# Patient Record
Sex: Female | Born: 1935
Health system: Southern US, Community
[De-identification: ages and names within clinical notes are randomized; demographics above are authoritative.]

## PROBLEM LIST (undated history)

## (undated) DIAGNOSIS — E7800 Pure hypercholesterolemia, unspecified: Secondary | ICD-10-CM

## (undated) DIAGNOSIS — C55 Malignant neoplasm of uterus, part unspecified: Secondary | ICD-10-CM

## (undated) DIAGNOSIS — F0280 Dementia in other diseases classified elsewhere without behavioral disturbance: Secondary | ICD-10-CM

## (undated) DIAGNOSIS — G309 Alzheimer's disease, unspecified: Secondary | ICD-10-CM

## (undated) HISTORY — PX: OTHER SURGICAL HISTORY: SHX169

## (undated) HISTORY — DX: Dementia in other diseases classified elsewhere, unspecified severity, without behavioral disturbance, psychotic disturbance, mood disturbance, and anxiety: F02.80

## (undated) HISTORY — DX: Pure hypercholesterolemia, unspecified: E78.00

## (undated) HISTORY — DX: Malignant neoplasm of uterus, part unspecified: C55

## (undated) HISTORY — DX: Alzheimer's disease, unspecified: G30.9

---

## 1995-12-30 HISTORY — PX: ABDOMINAL HYSTERECTOMY: SHX81

## 2012-01-23 LAB — HM COLONOSCOPY

## 2014-03-01 LAB — HM DEXA SCAN

## 2015-01-25 DIAGNOSIS — R413 Other amnesia: Secondary | ICD-10-CM | POA: Diagnosis not present

## 2015-01-29 DIAGNOSIS — H25813 Combined forms of age-related cataract, bilateral: Secondary | ICD-10-CM | POA: Diagnosis not present

## 2015-01-29 DIAGNOSIS — H02839 Dermatochalasis of unspecified eye, unspecified eyelid: Secondary | ICD-10-CM | POA: Diagnosis not present

## 2015-02-12 DIAGNOSIS — Z6821 Body mass index (BMI) 21.0-21.9, adult: Secondary | ICD-10-CM | POA: Diagnosis not present

## 2015-02-12 DIAGNOSIS — E785 Hyperlipidemia, unspecified: Secondary | ICD-10-CM | POA: Diagnosis not present

## 2015-02-12 DIAGNOSIS — R634 Abnormal weight loss: Secondary | ICD-10-CM | POA: Diagnosis not present

## 2015-02-12 DIAGNOSIS — Z0000 Encounter for general adult medical examination without abnormal findings: Secondary | ICD-10-CM | POA: Diagnosis not present

## 2015-02-12 DIAGNOSIS — R413 Other amnesia: Secondary | ICD-10-CM | POA: Diagnosis not present

## 2015-03-05 DIAGNOSIS — Z1231 Encounter for screening mammogram for malignant neoplasm of breast: Secondary | ICD-10-CM | POA: Diagnosis not present

## 2015-03-05 DIAGNOSIS — Z7251 High risk heterosexual behavior: Secondary | ICD-10-CM | POA: Diagnosis not present

## 2015-03-12 DIAGNOSIS — H25812 Combined forms of age-related cataract, left eye: Secondary | ICD-10-CM | POA: Diagnosis not present

## 2015-03-20 DIAGNOSIS — H25812 Combined forms of age-related cataract, left eye: Secondary | ICD-10-CM | POA: Diagnosis not present

## 2015-03-20 DIAGNOSIS — Z79899 Other long term (current) drug therapy: Secondary | ICD-10-CM | POA: Diagnosis not present

## 2015-04-02 DIAGNOSIS — H25811 Combined forms of age-related cataract, right eye: Secondary | ICD-10-CM | POA: Diagnosis not present

## 2015-04-11 DIAGNOSIS — H25811 Combined forms of age-related cataract, right eye: Secondary | ICD-10-CM | POA: Diagnosis not present

## 2015-04-18 DIAGNOSIS — R413 Other amnesia: Secondary | ICD-10-CM | POA: Diagnosis not present

## 2015-05-24 DIAGNOSIS — M79672 Pain in left foot: Secondary | ICD-10-CM | POA: Diagnosis not present

## 2015-05-24 DIAGNOSIS — M79671 Pain in right foot: Secondary | ICD-10-CM | POA: Diagnosis not present

## 2015-05-24 DIAGNOSIS — B070 Plantar wart: Secondary | ICD-10-CM | POA: Diagnosis not present

## 2015-06-11 DIAGNOSIS — M79671 Pain in right foot: Secondary | ICD-10-CM | POA: Diagnosis not present

## 2015-06-11 DIAGNOSIS — B070 Plantar wart: Secondary | ICD-10-CM | POA: Diagnosis not present

## 2015-06-18 DIAGNOSIS — R413 Other amnesia: Secondary | ICD-10-CM | POA: Diagnosis not present

## 2015-06-25 DIAGNOSIS — M79671 Pain in right foot: Secondary | ICD-10-CM | POA: Diagnosis not present

## 2015-06-25 DIAGNOSIS — B070 Plantar wart: Secondary | ICD-10-CM | POA: Diagnosis not present

## 2015-07-18 DIAGNOSIS — M899 Disorder of bone, unspecified: Secondary | ICD-10-CM | POA: Diagnosis not present

## 2015-07-18 DIAGNOSIS — M949 Disorder of cartilage, unspecified: Secondary | ICD-10-CM | POA: Diagnosis not present

## 2015-07-18 DIAGNOSIS — E785 Hyperlipidemia, unspecified: Secondary | ICD-10-CM | POA: Diagnosis not present

## 2015-07-18 DIAGNOSIS — R413 Other amnesia: Secondary | ICD-10-CM | POA: Diagnosis not present

## 2015-07-18 DIAGNOSIS — Z6821 Body mass index (BMI) 21.0-21.9, adult: Secondary | ICD-10-CM | POA: Diagnosis not present

## 2015-08-08 DIAGNOSIS — Z961 Presence of intraocular lens: Secondary | ICD-10-CM | POA: Diagnosis not present

## 2015-08-08 DIAGNOSIS — H43393 Other vitreous opacities, bilateral: Secondary | ICD-10-CM | POA: Diagnosis not present

## 2015-09-13 DIAGNOSIS — L821 Other seborrheic keratosis: Secondary | ICD-10-CM | POA: Diagnosis not present

## 2015-09-13 DIAGNOSIS — Z1389 Encounter for screening for other disorder: Secondary | ICD-10-CM | POA: Diagnosis not present

## 2015-09-13 DIAGNOSIS — L814 Other melanin hyperpigmentation: Secondary | ICD-10-CM | POA: Diagnosis not present

## 2015-10-03 DIAGNOSIS — R413 Other amnesia: Secondary | ICD-10-CM | POA: Diagnosis not present

## 2015-10-31 DIAGNOSIS — Z23 Encounter for immunization: Secondary | ICD-10-CM | POA: Diagnosis not present

## 2015-10-31 DIAGNOSIS — D485 Neoplasm of uncertain behavior of skin: Secondary | ICD-10-CM | POA: Diagnosis not present

## 2015-11-08 DIAGNOSIS — D485 Neoplasm of uncertain behavior of skin: Secondary | ICD-10-CM | POA: Diagnosis not present

## 2015-12-11 DIAGNOSIS — L708 Other acne: Secondary | ICD-10-CM | POA: Diagnosis not present

## 2016-01-18 DIAGNOSIS — R634 Abnormal weight loss: Secondary | ICD-10-CM | POA: Diagnosis not present

## 2016-01-18 DIAGNOSIS — R011 Cardiac murmur, unspecified: Secondary | ICD-10-CM | POA: Diagnosis not present

## 2016-01-18 DIAGNOSIS — E785 Hyperlipidemia, unspecified: Secondary | ICD-10-CM | POA: Diagnosis not present

## 2016-01-18 DIAGNOSIS — Z6822 Body mass index (BMI) 22.0-22.9, adult: Secondary | ICD-10-CM | POA: Diagnosis not present

## 2016-01-18 DIAGNOSIS — C549 Malignant neoplasm of corpus uteri, unspecified: Secondary | ICD-10-CM | POA: Diagnosis not present

## 2016-01-18 LAB — CBC AND DIFFERENTIAL
HCT: 36 % (ref 36–46)
Hemoglobin: 12 g/dL (ref 12.0–16.0)
Platelets: 208 10*3/uL (ref 150–399)
WBC: 7.1 10*3/mL

## 2016-01-18 LAB — LIPID PANEL
CHOLESTEROL: 123 mg/dL (ref 0–200)
HDL: 55 mg/dL (ref 35–70)
LDL Cholesterol: 52 mg/dL
TRIGLYCERIDES: 78 mg/dL (ref 40–160)

## 2016-01-18 LAB — BASIC METABOLIC PANEL
BUN: 16 mg/dL (ref 4–21)
CREATININE: 0.8 mg/dL (ref 0.5–1.1)
Glucose: 80 mg/dL
POTASSIUM: 5.2 mmol/L (ref 3.4–5.3)
Sodium: 143 mmol/L (ref 137–147)

## 2016-01-18 LAB — TSH: TSH: 2.13 u[IU]/mL (ref 0.41–5.90)

## 2016-02-06 DIAGNOSIS — R011 Cardiac murmur, unspecified: Secondary | ICD-10-CM | POA: Diagnosis not present

## 2016-02-14 DIAGNOSIS — H43393 Other vitreous opacities, bilateral: Secondary | ICD-10-CM | POA: Diagnosis not present

## 2016-02-14 DIAGNOSIS — Z961 Presence of intraocular lens: Secondary | ICD-10-CM | POA: Diagnosis not present

## 2016-02-14 DIAGNOSIS — H26491 Other secondary cataract, right eye: Secondary | ICD-10-CM | POA: Diagnosis not present

## 2016-04-02 DIAGNOSIS — G309 Alzheimer's disease, unspecified: Secondary | ICD-10-CM | POA: Diagnosis not present

## 2016-04-02 DIAGNOSIS — R413 Other amnesia: Secondary | ICD-10-CM | POA: Diagnosis not present

## 2016-04-07 DIAGNOSIS — Z124 Encounter for screening for malignant neoplasm of cervix: Secondary | ICD-10-CM | POA: Diagnosis not present

## 2016-04-07 DIAGNOSIS — Z6822 Body mass index (BMI) 22.0-22.9, adult: Secondary | ICD-10-CM | POA: Diagnosis not present

## 2016-04-07 DIAGNOSIS — Z1231 Encounter for screening mammogram for malignant neoplasm of breast: Secondary | ICD-10-CM | POA: Diagnosis not present

## 2016-04-07 LAB — HM MAMMOGRAPHY: HM MAMMO: NORMAL (ref 0–4)

## 2016-05-13 DIAGNOSIS — Z6821 Body mass index (BMI) 21.0-21.9, adult: Secondary | ICD-10-CM | POA: Diagnosis not present

## 2016-05-13 DIAGNOSIS — R413 Other amnesia: Secondary | ICD-10-CM | POA: Diagnosis not present

## 2016-05-13 DIAGNOSIS — E785 Hyperlipidemia, unspecified: Secondary | ICD-10-CM | POA: Diagnosis not present

## 2016-07-04 ENCOUNTER — Telehealth: Payer: Self-pay | Admitting: Internal Medicine

## 2016-08-13 ENCOUNTER — Non-Acute Institutional Stay: Payer: Medicare Other | Admitting: Internal Medicine

## 2016-08-13 ENCOUNTER — Encounter: Payer: Self-pay | Admitting: Internal Medicine

## 2016-08-13 VITALS — BP 120/70 | HR 75 | Temp 98.7°F | Ht 60.5 in | Wt 114.0 lb

## 2016-08-13 DIAGNOSIS — M150 Primary generalized (osteo)arthritis: Secondary | ICD-10-CM | POA: Diagnosis not present

## 2016-08-13 DIAGNOSIS — M159 Polyosteoarthritis, unspecified: Secondary | ICD-10-CM

## 2016-08-13 DIAGNOSIS — F0280 Dementia in other diseases classified elsewhere without behavioral disturbance: Secondary | ICD-10-CM

## 2016-08-13 DIAGNOSIS — E785 Hyperlipidemia, unspecified: Secondary | ICD-10-CM

## 2016-08-13 DIAGNOSIS — N3281 Overactive bladder: Secondary | ICD-10-CM

## 2016-08-13 DIAGNOSIS — M8580 Other specified disorders of bone density and structure, unspecified site: Secondary | ICD-10-CM

## 2016-08-13 DIAGNOSIS — G301 Alzheimer's disease with late onset: Secondary | ICD-10-CM

## 2016-08-13 DIAGNOSIS — Z8542 Personal history of malignant neoplasm of other parts of uterus: Secondary | ICD-10-CM

## 2016-08-13 DIAGNOSIS — M8949 Other hypertrophic osteoarthropathy, multiple sites: Secondary | ICD-10-CM

## 2016-08-13 NOTE — Progress Notes (Signed)
Provider:  Rexene Edison. Mariea Clonts, D.O., C.M.D. Location:   [facility]   Place of Service:  Clinic (12)  Previous PCP: Hollace Kinnier, DO Patient Care Team: Gayland Curry, DO as PCP - General (Geriatric Medicine) Addison Lank, MD as Consulting Physician (Dermatology)  Extended Emergency Contact Information Primary Emergency Contact: Jaeger,[name] Address: [address] [place], [place] [address] [name] of [place] Phone: [phone] Relation: Son  Code Status: full code Goals of Care: Advanced Directive information No flowsheet data found.  Chief Complaint  Patient presents with  . Establish Care    new patient    HPI: Patient is a 82 y.o. female seen today to establish with [facility].  She has a h/o Alzheimer's disease, uterine cancer and hyperlipidemia.  Records have been requested and received from [facility] hospital/IM.    AD:  On Exelon patch.  Is happy here.  She is unsure how long she has been here.  2/16, patch was started soon after diagnosis.    Says we are going into fall, says it's 2018.  She thinks it is already September.  Now living in [place].  Says she could find it, but does not know address. Spirits are good.  No hallucinations.  Sleeps well at night.  No falls.    Hears very well.  Sight pretty good, too.    Uterine ca:  S/p hysterectomy in 1997 at [facility].    Hyperlipidemia:  On simvastatin.  Overactive bladder:  On myrbetriq 50mg  daily which has helped.  She does still go more than an average person.   Bone density 03/01/14--on vitamin D.  I don't have a copy.  She has not been on meds for osteoporosis.  She does walk regularly with her husband.  Past Medical History:  Diagnosis Date  . Alzheimer's disease   . High cholesterol   . Uterine cancer [facility])    Past Surgical History:  Procedure Laterality Date  . [facility]  . cataract surgery Bilateral     Social History   Social  History  . Marital status: Married    Spouse name: N/A  . Number of children: 2  . Years of education: N/A   Occupational History  . Education officer, museum    Social History Main Topics  . Smoking status: Never Smoker  . Smokeless tobacco: Former Systems developer  . Alcohol use 0.6 - 1.2 oz/week    1 - 2 Standard drinks or equivalent per week  . Drug use: No  . Sexual activity: Not Asked   Other Topics Concern  . None   Social History Narrative   Diet? No      Do you drink/eat things with caffeine?  No      Marital status?     Married                               What year were you married?  1960      Do you live in a house, apartment, assisted living, condo, trailer, etc.?  [place]      Is it one or more stories? One      How many persons live in your home? 2      Do you have any pets in your home? (please list) No      Current or past profession:  Retired Education officer, museum      Do you exercise?               yes                       Type & how often?  Walk daily      Do you have a living will? no      Do you have a DNR form?     no                             If not, do you want to discuss one? yes      Do you have signed POA/HPOA for forms? yes          reports that she has never smoked. She has quit using smokeless tobacco. She reports that she drinks about 0.6 - 1.2 oz of alcohol per week . She reports that she does not use drugs.  Functional Status Survey: Is the patient deaf or have difficulty hearing?: No Does the patient have difficulty seeing, even when wearing glasses/contacts?: No Does the patient have difficulty concentrating, remembering, or making decisions?: Yes Does the patient have difficulty walking or climbing stairs?: No Does the patient have difficulty dressing or bathing?: No Does the patient have difficulty doing errands alone such as visiting a doctor's office or shopping?: Yes  Family History  Problem Relation Age of Onset  .  Suicidality Brother   . Heart failure Brother   . Leukemia Sister     indicated that her mother is deceased. She indicated that her father is deceased. She indicated that two of her three sisters are alive. She indicated that both of her brothers are deceased. She indicated that her daughter is alive. She indicated that her son is alive.  see encounter for illnesses--aren't showing up for some reason  Health Maintenance  Topic Date Due  . TETANUS/TDAP  12/05/1954  . ZOSTAVAX  12/06/1995  . PNA vac Low Risk Adult (2 of 2 - PCV13) 02/03/2013  . INFLUENZA VACCINE  07/29/2016  . COLONOSCOPY  01/22/2017  . MAMMOGRAM  04/07/2017  . DEXA SCAN  Completed    Allergies  Allergen Reactions  . Penicillins Hives  . Sulfa Antibiotics Hives      Medication List       Accurate as of 08/13/16 11:59 PM. Always use your most recent med list.          GLUCOSAMINE CHONDR 1500 COMPLX PO Take 1 tablet by mouth 2 (two) times daily.   multivitamin with minerals tablet Take 1 tablet by mouth daily.   MYRBETRIQ 50 MG Tb24 tablet Generic drug:  mirabegron ER Take 50 mg by mouth daily.   rivastigmine 9.5 mg/24hr Commonly known as:  EXELON Place 9.5 mg onto the skin daily.   simvastatin 20 MG tablet Commonly known as:  ZOCOR Take 20 mg by mouth daily.   Vitamin D3 2000 units Tabs Take 1 tablet by mouth daily.       Review of Systems  Constitutional: Negative for chills, fever and malaise/fatigue.  HENT: Negative for congestion and hearing loss.   Eyes: Negative for blurred vision.       Prior cataract surgery  Respiratory: Negative for cough and shortness of breath.   Cardiovascular: Negative for chest pain, palpitations and leg swelling.  Gastrointestinal: Negative for abdominal pain, blood in stool, constipation, diarrhea and  melena.  Genitourinary: Positive for frequency. Negative for dysuria, flank pain, hematuria and urgency.  Musculoskeletal: Positive for joint pain. Negative  for falls and myalgias.  Skin: Negative for itching and rash.  Neurological: Negative for dizziness, loss of consciousness, weakness and headaches.  Endo/Heme/Allergies: Does not bruise/bleed easily.  Psychiatric/Behavioral: Positive for memory loss. Negative for depression, hallucinations, substance abuse and suicidal ideas. The patient is not nervous/anxious and does not have insomnia.     Vitals:   08/13/16 1426  BP: 120/70  Pulse: 75  Temp: 98.7 F (37.1 C)  TempSrc: Oral  SpO2: 95%  Weight: 114 lb (51.7 kg)  Height: 5' 0.5" (1.537 m)   Body mass index is 21.9 kg/m. Physical Exam  Constitutional: She appears well-developed and well-nourished. No distress.  Thin white female   HENT:  Head: Normocephalic and atraumatic.  Eyes: EOM are normal. Pupils are equal, round, and reactive to light.  Neck: Neck supple.  Cardiovascular: Normal rate, regular rhythm, normal heart sounds and intact distal pulses.   Pulmonary/Chest: Effort normal and breath sounds normal. No respiratory distress.  Abdominal: Soft. Bowel sounds are normal.  Musculoskeletal: Normal range of motion. She exhibits no tenderness.  Neurological: She is alert. She has normal reflexes. No cranial nerve deficit. She exhibits normal muscle tone.  Oriented to person and place, not precise time  Skin: Skin is warm and dry.  Psychiatric: She has a normal mood and affect.  Short term memory loss--did repeat herself some during the course of the visit    Labs reviewed: Basic Metabolic Panel:  Recent Labs  01/18/16  NA 143  K 5.2  BUN 16  CREATININE 0.8   Liver Function Tests: No results for input(s): AST, ALT, ALKPHOS, BILITOT, PROT, ALBUMIN in the last 8760 hours. No results for input(s): LIPASE, AMYLASE in the last 8760 hours. No results for input(s): AMMONIA in the last 8760 hours. CBC:  Recent Labs  01/18/16  WBC 7.1  HGB 12.0  HCT 36  PLT 208   Cardiac Enzymes: No results for input(s): CKTOTAL,  CKMB, CKMBINDEX, TROPONINI in the last 8760 hours. BNP: Invalid input(s): POCBNP No results found for: HGBA1C Lab Results  Component Value Date   TSH 2.13 01/18/2016   No results found for: VITAMINB12 No results found for: FOLATE No results found for: IRON, TIBC, FERRITIN  Imaging and Procedures noted on new patient packet: [facility], [place], [place] 2013  [facility], [place], [place] CT, MRI brain re: dementia--chronic small vessel ischemic changes  Assessment/Plan 1. Late onset Alzheimer's disease without behavioral disturbance -will cont on exelon patch 9.5mg /24hr -will need to check with her husband if there is a reason she has not been put on the max dose of 13.3mg /24hr since the dose is not severity dependent  -will need to do MMSE next time -she needs guidance when doing her adls in the morning and can no longer fix herself up as well as she once did (difficulty doing her hair and makeup vs. In the past)--her husband reports that he is still able to help her mostly on his own but also with the support of local family (why they moved here to begin with)  2. History of uterine cancer -s/p hysterectomy  -no difficulties since  3. Hyperlipidemia -continue her zocor which has been effective and she has not had problems from it -has no difficulties with med mgt  4. OAB (overactive bladder) -some improvement in the incontinence and degree  of frequency with 50mg  myrbetriq so will continue this, does use a pad also  5. Osteopenia -cont on VitaminD3 and weightbearing exercise -is overdue for a repeat bone density so wiill plan to order at the next visit--asked CMA to request the bone density report from prior pcp b/c we only have the date it was done and her husband was not as actively involved at that point so he's not sure about details of result  6. Primary osteoarthritis involving multiple joints -continues on glucosamine chondroitin  supplement and advised to use tylenol if she has increased pain  Labs/tests ordered:  Cbc, bmp, tsh, lipid before next appt Next appt:  12/10/2016  Cregg Jutte L. Kumiko Fishman, D.O. [facility] [address], [place] [address] Cell Phone (Mon-Fri 8am-5pm):  [phone] On Call:  [phone] & follow prompts after 5pm & weekends Office Phone:  [phone] Office Fax:  [phone]

## 2016-08-24 ENCOUNTER — Encounter: Payer: Self-pay | Admitting: Internal Medicine

## 2016-08-24 DIAGNOSIS — M8580 Other specified disorders of bone density and structure, unspecified site: Secondary | ICD-10-CM | POA: Insufficient documentation

## 2016-08-24 DIAGNOSIS — E785 Hyperlipidemia, unspecified: Secondary | ICD-10-CM | POA: Insufficient documentation

## 2016-08-24 DIAGNOSIS — N3281 Overactive bladder: Secondary | ICD-10-CM | POA: Insufficient documentation

## 2016-08-24 DIAGNOSIS — F0280 Dementia in other diseases classified elsewhere without behavioral disturbance: Secondary | ICD-10-CM | POA: Insufficient documentation

## 2016-08-24 DIAGNOSIS — G309 Alzheimer's disease, unspecified: Secondary | ICD-10-CM

## 2016-08-24 DIAGNOSIS — Z8542 Personal history of malignant neoplasm of other parts of uterus: Secondary | ICD-10-CM | POA: Insufficient documentation

## 2016-08-24 DIAGNOSIS — M8949 Other hypertrophic osteoarthropathy, multiple sites: Secondary | ICD-10-CM | POA: Insufficient documentation

## 2016-08-24 DIAGNOSIS — M150 Primary generalized (osteo)arthritis: Secondary | ICD-10-CM

## 2016-08-24 DIAGNOSIS — M159 Polyosteoarthritis, unspecified: Secondary | ICD-10-CM | POA: Insufficient documentation

## 2016-09-17 DIAGNOSIS — L218 Other seborrheic dermatitis: Secondary | ICD-10-CM | POA: Diagnosis not present

## 2016-10-23 DIAGNOSIS — Z23 Encounter for immunization: Secondary | ICD-10-CM | POA: Diagnosis not present

## 2016-12-10 ENCOUNTER — Encounter: Payer: Self-pay | Admitting: Internal Medicine

## 2017-01-07 ENCOUNTER — Non-Acute Institutional Stay: Payer: Medicare Other | Admitting: Internal Medicine

## 2017-01-07 ENCOUNTER — Encounter: Payer: Self-pay | Admitting: Internal Medicine

## 2017-01-07 VITALS — BP 120/70 | HR 72 | Temp 98.7°F | Ht 61.0 in | Wt 116.0 lb

## 2017-01-07 DIAGNOSIS — R413 Other amnesia: Secondary | ICD-10-CM

## 2017-01-07 DIAGNOSIS — M150 Primary generalized (osteo)arthritis: Secondary | ICD-10-CM | POA: Diagnosis not present

## 2017-01-07 DIAGNOSIS — E7800 Pure hypercholesterolemia, unspecified: Secondary | ICD-10-CM

## 2017-01-07 DIAGNOSIS — N3281 Overactive bladder: Secondary | ICD-10-CM

## 2017-01-07 DIAGNOSIS — G301 Alzheimer's disease with late onset: Secondary | ICD-10-CM

## 2017-01-07 DIAGNOSIS — M159 Polyosteoarthritis, unspecified: Secondary | ICD-10-CM

## 2017-01-07 DIAGNOSIS — M8589 Other specified disorders of bone density and structure, multiple sites: Secondary | ICD-10-CM | POA: Diagnosis not present

## 2017-01-07 DIAGNOSIS — E2839 Other primary ovarian failure: Secondary | ICD-10-CM

## 2017-01-07 DIAGNOSIS — M8949 Other hypertrophic osteoarthropathy, multiple sites: Secondary | ICD-10-CM

## 2017-01-07 DIAGNOSIS — F0280 Dementia in other diseases classified elsewhere without behavioral disturbance: Secondary | ICD-10-CM

## 2017-01-07 NOTE — Progress Notes (Signed)
Location:  Occupational psychologist of Service:  Clinic (12)  Provider: Oneisha Ammons L. Mariea Clonts, D.O., C.M.D.  Code Status: DNR Goals of Care:  Advanced Directives 01/07/2017  Does Patient Have a Medical Advance Directive? Yes  Type of Paramedic of [place];Living will  Copy of [place] in Chart? Yes    Chief Complaint  Patient presents with  . Medical Management of Chronic Issues    19mth follow-up  . MMSE    19/30 failed clock   HPI: Patient is a 83 y.o. female seen today for medical management of chronic diseases.    AD:  She has had some spells in the last month where she didn't know him.  It happened 3 times at night.  She was distressed that some mysterious man was in the house.  She got out of bed, put clothes on top of her pjs and a raincoat--on New Year's Eve.  She was sitting up on the sofa and he thought she was sleeping.  He went back to bed.  Music played which he first thought it was his cell phone.  Suddenly, he realized it was the front door.  Went to a neighbor's house and said there was a stranger in her house.  Security and a neighbor brought her back. When she saw him, she didn't know any of this transpired.  They are gong to install a louder door alarm and got her a necklace pendant that will keep track of her.  She had been seeing a neurologist before moving here.  She continues to get herself bathed and dressed.  Sometimes she remembers things Iona Beard forgets.    She usually sleeps well at night. Denies pain.   Some OAB on record and takes myrbetriq.  Her husband fills the pillbox and administers her meds.   Lipids looked good one year ago on zocor.   No falls.  Past Medical History:  Diagnosis Date  . Alzheimer's disease   . High cholesterol   . Uterine cancer [facility])     Past Surgical History:  Procedure Laterality Date  . [facility]  . cataract surgery Bilateral      Allergies  Allergen Reactions  . Penicillins Hives  . Sulfa Antibiotics Hives    Allergies as of 01/07/2017      Reactions   Penicillins Hives   Sulfa Antibiotics Hives      Medication List       Accurate as of 01/07/17  9:36 AM. Always use your most recent med list.          GLUCOSAMINE CHONDR 1500 COMPLX PO Take 1 tablet by mouth 2 (two) times daily.   multivitamin with minerals tablet Take 1 tablet by mouth daily.   MYRBETRIQ 50 MG Tb24 tablet Generic drug:  mirabegron ER Take 50 mg by mouth daily.   rivastigmine 9.5 mg/24hr Commonly known as:  EXELON Place 9.5 mg onto the skin daily.   simvastatin 20 MG tablet Commonly known as:  ZOCOR Take 20 mg by mouth daily.   Vitamin D3 2000 units Tabs Take 1 tablet by mouth daily.      Review of Systems:  Review of Systems  Constitutional: Negative for chills, fever and malaise/fatigue.  HENT: Negative for congestion and hearing loss.   Eyes: Negative for blurred vision.  Respiratory: Negative for shortness of breath.   Cardiovascular: Negative for chest pain, palpitations and leg swelling.  Gastrointestinal: Negative for abdominal pain, blood in stool, constipation, diarrhea, melena, nausea and vomiting.  Genitourinary: Positive for frequency. Negative for dysuria and urgency.       Improved with myrbetriq  Musculoskeletal: Negative for back pain, falls, joint pain, myalgias and neck pain.  Skin: Negative for itching and rash.  Neurological: Negative for dizziness, loss of consciousness, weakness and headaches.  Endo/Heme/Allergies: Does not bruise/bleed easily.  Psychiatric/Behavioral: Positive for memory loss. Negative for depression and hallucinations. The patient is not nervous/anxious and does not have insomnia.     Health Maintenance  Topic Date Due  . TETANUS/TDAP  12/05/1954  . ZOSTAVAX  12/06/1995  . PNA vac Low Risk Adult (2 of 2 - PCV13) 02/03/2013  . COLONOSCOPY  01/22/2017  . MAMMOGRAM   04/07/2017  . INFLUENZA VACCINE  Completed  . DEXA SCAN  Completed    Physical Exam: Vitals:   01/07/17 0840  BP: 120/70  Pulse: 72  Temp: 98.7 F (37.1 C)  TempSrc: Oral  SpO2: 96%  Weight: 116 lb (52.6 kg)  Height: 5\' 1"  (1.549 m)   Body mass index is 21.92 kg/m. Physical Exam  Constitutional: She is oriented to person, place, and time. She appears well-developed and well-nourished. No distress.  Cardiovascular: Normal rate, regular rhythm, normal heart sounds and intact distal pulses.   Pulmonary/Chest: Effort normal and breath sounds normal. No respiratory distress.  Abdominal: Soft. Bowel sounds are normal.  Musculoskeletal: Normal range of motion. She exhibits no tenderness or deformity.  Neurological: She is alert and oriented to person, place, and time.  Skin: Skin is warm and dry.  Psychiatric: She has a normal mood and affect.   Labs reviewed: Basic Metabolic Panel:  Recent Labs  01/18/16  NA 143  K 5.2  BUN 16  CREATININE 0.8  TSH 2.13  CBC:  Recent Labs  01/18/16  WBC 7.1  HGB 12.0  HCT 36  PLT 208   Lipid Panel:  Recent Labs  01/18/16  CHOL 123  HDL 55  LDLCALC 52  TRIG 78   Assessment/Plan 1. Late onset Alzheimer's disease without behavioral disturbance -is progressing--scored 19/30 on mmse today -agree with plans for louder alarm on doors, use of life alert necklace; given coach playbook for AD caregivers to her husband and pocket reference to help him cope with situations where she does not recognize him. -failed clock - will start on namenda XR titration pack plus 28mg  month supply - request neurology referral so will do this  2. Estrogen deficiency - due for f/u bone density test--osteopenia on record, cont vitamin D supplement - DG Bone Density; Future  3. Primary osteoarthritis involving multiple joints -continues on glucosamine, has no complaints about her joints  4. Osteopenia of multiple sites Recheck bone density, cont  vitamin d and weightbearing exercise  5. OAB (overactive bladder) -doing better on myrbetriq, cont same  6. Pure hypercholesterolemia -was well controlled one year ago, recheck flp next lab  Labs/tests ordered:  Cbc, cmp, flp, tsh, b12/folate Next appt:  2 mos with labs before   [place]. Maha Fischel, D.O. [facility] [address], [place] [address] Cell Phone (Mon-Fri 8am-5pm):  [phone] On Call:  [phone] & follow prompts after 5pm & weekends Office Phone:  [phone] Office Fax:  [phone]

## 2017-01-08 MED ORDER — MEMANTINE HCL ER 7 & 14 & 21 &28 MG PO CP24: ORAL_CAPSULE | ORAL | 0 refills | Status: DC

## 2017-01-08 MED ORDER — MEMANTINE HCL ER 28 MG PO CP24: 28.0000 mg | ORAL_CAPSULE | Freq: Every day | ORAL | 3 refills | Status: DC

## 2017-01-08 NOTE — Addendum Note (Signed)
Addended by: Despina Hidden on: 01/08/2017 10:23 AM   Modules accepted: Orders

## 2017-01-09 ENCOUNTER — Other Ambulatory Visit: Payer: Self-pay | Admitting: Internal Medicine

## 2017-01-12 ENCOUNTER — Other Ambulatory Visit: Payer: Self-pay | Admitting: *Deleted

## 2017-01-12 MED ORDER — MEMANTINE HCL ER 7 & 14 & 21 &28 MG PO CP24: ORAL_CAPSULE | ORAL | 0 refills | Status: DC

## 2017-01-14 ENCOUNTER — Telehealth: Payer: Self-pay | Admitting: *Deleted

## 2017-01-14 NOTE — Telephone Encounter (Signed)
Received fax from [facility] [phone]. Case ID#: [id number]. Form given to Dr. Mariea Clonts to review, fill out and sign. To be faxed back to [phone]

## 2017-01-15 ENCOUNTER — Other Ambulatory Visit: Payer: Self-pay

## 2017-01-20 NOTE — Telephone Encounter (Signed)
Fax received from [facility] and Namenda XR has been APPROVED through 12/28/2098.

## 2017-01-22 ENCOUNTER — Ambulatory Visit
Admission: RE | Admit: 2017-01-22 | Discharge: 2017-01-22 | Disposition: A | Discharge status: Home / Self Care | Payer: Medicare Other | Source: Ambulatory Visit | Attending: Internal Medicine | Admitting: Internal Medicine

## 2017-01-22 DIAGNOSIS — Z780 Asymptomatic menopausal state: Secondary | ICD-10-CM | POA: Diagnosis not present

## 2017-01-22 DIAGNOSIS — M8589 Other specified disorders of bone density and structure, multiple sites: Secondary | ICD-10-CM | POA: Diagnosis not present

## 2017-01-22 DIAGNOSIS — E2839 Other primary ovarian failure: Secondary | ICD-10-CM

## 2017-01-23 ENCOUNTER — Encounter: Payer: Self-pay | Admitting: *Deleted

## 2017-02-05 DIAGNOSIS — Z0289 Encounter for other administrative examinations: Secondary | ICD-10-CM

## 2017-02-23 ENCOUNTER — Ambulatory Visit (INDEPENDENT_AMBULATORY_CARE_PROVIDER_SITE_OTHER): Payer: Medicare Other | Admitting: Neurology

## 2017-02-23 ENCOUNTER — Encounter: Payer: Self-pay | Admitting: Neurology

## 2017-02-23 VITALS — BP 118/68 | HR 74 | Temp 97.9°F | Resp 16 | Ht 61.0 in | Wt 114.7 lb

## 2017-02-23 DIAGNOSIS — F03B0 Unspecified dementia, moderate, without behavioral disturbance, psychotic disturbance, mood disturbance, and anxiety: Secondary | ICD-10-CM

## 2017-02-23 DIAGNOSIS — F0390 Unspecified dementia without behavioral disturbance: Secondary | ICD-10-CM

## 2017-02-23 NOTE — Progress Notes (Signed)
NEUROLOGY CONSULTATION NOTE  JOHNA OVERMILLER MRN: [id number] DOB: 06/21/1933  Referring provider: Dr. Hollace Kinnier Primary care provider: Dr. Hollace Kinnier  Reason for consult:  dementia  Dear Dr Mariea Clonts:  Thank you for your kind referral of CYAN BARBOSA for consultation of the above symptoms. Although her history is well known to you, please allow me to reiterate it for the purpose of our medical record. The patient was accompanied to the clinic by her husband who also provides collateral information. Records and images were personally reviewed where available.  HISTORY OF PRESENT ILLNESS: This is a very pleasant 83 year old right-handed woman with a history of hyperlipidemia, uterine cancer, and Alzheimer's dementia, presenting to establish care. She had previously been living in [place], [place] and moved to [place] last August 2017 to be closer to her son. She and her husband live at a continuing care retirement home ([facility]). She thinks her memory is "pretty good." She then states that she lives in [place] and right now she is visiting with her husband who lives in [place]. She states "right now I am staying at your house." Her family started to notice memory changes in 2015, she was repeating herself. She was driving erratically and would miss a stop sign. She saw neurologist Dr. Eulis Manly who did several tests and was diagnosed with Alzheimer's disease. She has been on the Exelon patch since the Spring of 2016 with no side effects. She does not drive anymore. Her husband is in charge of bills. She states she is pretty good with remembering her medications, he husband shakes his head. She does not cook, they usually eat at the dining room in the facility. One time she brought back iced tea and he told her to put it in the fridge. She walked to the bathroom then the bedroom looking for the fridge, then came back with the cup in her hand not knowing what to do with it. She does  not recognize her husband 1-2 times a week, especially at night. A month or so ago, she got out of bed at midnight and did not know who he was. She wanted to sleep in the living room, then 30 minutes later he heard the doorbell and found that security came because she went to a neighbor and said there was a stranger in the house. They are now in a more secure locked apartment with closer supervision. Sometimes she cannot recall her son's name and confuses him with her brother. She occasionally says she wants to go back to [place] and get a job. She occasionally thinks there is someone else in the room or talks about people coming to see them at night. Her husband does the laundry. She tries to help with the dishes but only rinses with lukewarm water and leaves them out. She can dress and bathe independently, but does not like to bathe. Her husband instead brings her to the jacuzzi three times a week. He denies any personality changes. Her MMSE with her PCP last month was 19/30, Namenda XR was added on to Exelon, which she is tolerating without side effects.  She denies any headaches, dizziness, diplopia, dysarthria, dysphagia, neck/back pain, focal numbness/tingling/weakness, bowel/bladder dysfunction. She sleeps well. No anosmia, tremors, no falls. No family history of dementia. She denies any significant head injuries. She drinks a small glass of wine with dinner every night. She is a Forensic psychologist and retired Education officer, museum.  Laboratory Data: Lab Results  Component Value Date   TSH 2.13 01/18/2016   PAST MEDICAL HISTORY: Past Medical History:  Diagnosis Date  . Alzheimer's disease   . High cholesterol   . Uterine cancer ([place])     PAST SURGICAL HISTORY: Past Surgical History:  Procedure Laterality Date  . [facility]  . cataract surgery Bilateral     MEDICATIONS: Current Outpatient Prescriptions on File Prior to Visit  Medication Sig Dispense Refill    . Cholecalciferol (VITAMIN D3) 2000 units TABS Take 1 tablet by mouth daily.    . Glucosamine-Chondroit-Vit C-Mn (GLUCOSAMINE CHONDR 1500 COMPLX PO) Take 1 tablet by mouth 2 (two) times daily.    . memantine (NAMENDA XR) 28 MG CP24 24 hr capsule Take 1 capsule (28 mg total) by mouth daily. 90 capsule 3  . Memantine HCl ER (NAMENDA XR TITRATION PACK) 7 & 14 & 21 &28 MG CP24 Take 1 by mouth daily of 7mg  week 1,Take 1 by mouth daily of 14mg  week 2,Take 1 by mouth daily of 21mg  week 3,Take 1 by mouth daily of 28mg  week 4 28 capsule 0  . mirabegron ER (MYRBETRIQ) 50 MG TB24 tablet Take 50 mg by mouth daily.    . Multiple Vitamins-Minerals (MULTIVITAMIN WITH MINERALS) tablet Take 1 tablet by mouth daily.    . rivastigmine (EXELON) 9.5 mg/24hr Place 9.5 mg onto the skin daily.    . simvastatin (ZOCOR) 20 MG tablet Take 20 mg by mouth daily.     No current facility-administered medications on file prior to visit.     ALLERGIES: Allergies  Allergen Reactions  . Penicillins Hives  . Sulfa Antibiotics Hives    FAMILY HISTORY: Family History  Problem Relation Age of Onset  . Suicidality Brother   . Heart failure Brother   . Leukemia Sister     SOCIAL HISTORY: Social History   Social History  . Marital status: Married    Spouse name: N/A  . Number of children: 2  . Years of education: N/A   Occupational History  . Education officer, museum    Social History Main Topics  . Smoking status: Never Smoker  . Smokeless tobacco: Former Systems developer  . Alcohol use 0.6 - 1.2 oz/week    1 - 2 Standard drinks or equivalent per week  . Drug use: No  . Sexual activity: Not on file   Other Topics Concern  . Not on file   Social History Narrative   Diet? No      Do you drink/eat things with caffeine?  No      Marital status?     Married                               What year were you married?  1960      Do you live in a house, apartment, assisted living, condo, trailer, etc.?  [place]       Is it one or more stories? One      How many persons live in your home? 2      Do you have any pets in your home? (please list) No      Current or past profession:  Retired Education officer, museum      Do you exercise?               yes  Type & how often?  Walk daily      Do you have a living will? no      Do you have a DNR form?     no                             If not, do you want to discuss one? yes      Do you have signed POA/HPOA for forms? yes          REVIEW OF SYSTEMS: Constitutional: No fevers, chills, or sweats, no generalized fatigue, change in appetite Eyes: No visual changes, double vision, eye pain Ear, nose and throat: No hearing loss, ear pain, nasal congestion, sore throat Cardiovascular: No chest pain, palpitations Respiratory:  No shortness of breath at rest or with exertion, wheezes GastrointestinaI: No nausea, vomiting, diarrhea, abdominal pain, fecal incontinence Genitourinary:  No dysuria, urinary retention or frequency Musculoskeletal:  No neck pain, back pain Integumentary: No rash, pruritus, skin lesions Neurological: as above Psychiatric: No depression, insomnia, anxiety Endocrine: No palpitations, fatigue, diaphoresis, mood swings, change in appetite, change in weight, increased thirst Hematologic/Lymphatic:  No anemia, purpura, petechiae. Allergic/Immunologic: no itchy/runny eyes, nasal congestion, recent allergic reactions, rashes  PHYSICAL EXAM: Vitals:   02/23/17 1312  BP: 118/68  Pulse: 74  Resp: 16  Temp: 97.9 F (36.6 C)   General: No acute distress Head:  Normocephalic/atraumatic Eyes: Fundoscopic exam shows bilateral sharp discs, no vessel changes, exudates, or hemorrhages Neck: supple, no paraspinal tenderness, full range of motion Back: No paraspinal tenderness Heart: regular rate and rhythm Lungs: Clear to auscultation bilaterally. Vascular: No carotid bruits. Skin/Extremities: No rash, no  edema Neurological Exam: Mental status: alert and oriented to person, place ("here for a check up"), no dysarthria or aphasia, Fund of knowledge is appropriate.  Recent and remote memory are impaired, initially could not remember birthday but after a few minutes could give correct year.  Attention and concentration are reduced.    Able to name objects and repeat phrases. CDT 1/5  MMSE - Mini Mental State Exam 02/23/2017 01/07/2017  Orientation to time 0 0  Orientation to Place 3 1  Registration 3 3  Attention/ Calculation 0 4  Recall 0 3  Language- name 2 objects 2 2  Language- repeat 1 1  Language- follow 3 step command 3 3  Language- read & follow direction 1 1  Write a sentence 1 1  Copy design 1 0  Total score 15 19   Cranial nerves: CN I: not tested CN II: pupils equal, round and reactive to light, visual fields intact, fundi unremarkable. CN III, IV, VI:  full range of motion, no nystagmus, no ptosis CN V: facial sensation intact CN VII: upper and lower face symmetric CN VIII: hearing intact to finger rub CN IX, X: gag intact, uvula midline CN XI: sternocleidomastoid and trapezius muscles intact CN XII: tongue midline Bulk & Tone: normal, no fasciculations. Motor: 5/5 throughout with no pronator drift. Sensation: intact to light touch, cold, pin, vibration and joint position sense.  No extinction to double simultaneous stimulation.  Romberg test negative Deep Tendon Reflexes: +2 throughout, no ankle clonus Plantar responses: downgoing bilaterally Cerebellar: no incoordination on finger to nose, heel to shin. No dysdiadochokinesia Gait: narrow-based and steady, mild difficulty with tandem walk but able Tremor: none  IMPRESSION: This is a very pleasant 83 year old right-handed woman with hyperlipidemia, uterine cancer, and Alzheimer's disease presenting  to establish care. Her MMSE today is 15/30 (19/30 in PCP office last month), indicating moderate dementia. She is now on  Exelon patch and Namenda XR with no side effects, continue current medications.Records from her previous neurologist in [place] will be requested for review. We discussed diagnosis, prognosis, and management of dementia. We discussed that if behavioral symptoms become problematic, we may consider starting a medication such as Seroquel. We discussed home safety, they live in a continuing care facility with Memory Care available later on. Continue control of vascular risk factors, physical exercise, and brain stimulation exercises for brain health. She will follow-up in 6 months and knows to call for any changes.   Thank you for allowing me to participate in the care of this patient. Please do not hesitate to call for any questions or concerns.   Ellouise Newer, M.D.  CC: Dr. Mariea Clonts

## 2017-02-23 NOTE — Patient Instructions (Signed)
1. Continue Exelon patch and Namenda as prescribed 2. Continue home safety 3. Records from Dr. Eulis Manly will be requested for review 4. Physical exercise and brain stimulation exercises (ie crossword puzzles, word search, reading, etc) are important for brain health 5. Follow-up in 6 months, call for any changes

## 2017-02-24 ENCOUNTER — Encounter: Payer: Self-pay | Admitting: Internal Medicine

## 2017-02-24 DIAGNOSIS — E7800 Pure hypercholesterolemia, unspecified: Secondary | ICD-10-CM | POA: Diagnosis not present

## 2017-02-24 DIAGNOSIS — E2839 Other primary ovarian failure: Secondary | ICD-10-CM | POA: Diagnosis not present

## 2017-02-24 DIAGNOSIS — R413 Other amnesia: Secondary | ICD-10-CM | POA: Diagnosis not present

## 2017-02-24 DIAGNOSIS — M8589 Other specified disorders of bone density and structure, multiple sites: Secondary | ICD-10-CM | POA: Diagnosis not present

## 2017-02-24 DIAGNOSIS — M150 Primary generalized (osteo)arthritis: Secondary | ICD-10-CM | POA: Diagnosis not present

## 2017-02-24 DIAGNOSIS — G301 Alzheimer's disease with late onset: Secondary | ICD-10-CM | POA: Diagnosis not present

## 2017-02-24 LAB — CBC AND DIFFERENTIAL
HCT: 39 % (ref 36–46)
Hemoglobin: 13 g/dL (ref 12.0–16.0)
Platelets: 215 10*3/uL (ref 150–399)
WBC: 5.8 10^3/mL

## 2017-02-24 LAB — LIPID PANEL
Cholesterol: 151 mg/dL (ref 0–200)
HDL: 60 mg/dL (ref 35–70)
LDL Cholesterol: 74 mg/dL
Triglycerides: 87 mg/dL (ref 40–160)

## 2017-02-24 LAB — BASIC METABOLIC PANEL
BUN: 20 mg/dL (ref 4–21)
Creatinine: 0.7 mg/dL (ref 0.5–1.1)
Glucose: 99 mg/dL
Potassium: 4.8 mmol/L (ref 3.4–5.3)
Sodium: 145 mmol/L (ref 137–147)

## 2017-02-24 LAB — HEPATIC FUNCTION PANEL
ALT: 14 U/L (ref 7–35)
AST: 22 U/L (ref 13–35)
Alkaline Phosphatase: 77 U/L (ref 25–125)
Bilirubin, Total: 0.3 mg/dL

## 2017-02-24 LAB — VITAMIN B12: Vitamin B-12: 695

## 2017-02-24 LAB — TSH: TSH: 3.7 u[IU]/mL (ref 0.41–5.90)

## 2017-03-02 ENCOUNTER — Encounter: Payer: Self-pay | Admitting: Internal Medicine

## 2017-03-04 ENCOUNTER — Encounter: Payer: Self-pay | Admitting: Internal Medicine

## 2017-03-04 ENCOUNTER — Non-Acute Institutional Stay: Payer: Medicare Other | Admitting: Internal Medicine

## 2017-03-04 VITALS — BP 120/70 | HR 76 | Temp 98.5°F | Wt 117.0 lb

## 2017-03-04 DIAGNOSIS — E7800 Pure hypercholesterolemia, unspecified: Secondary | ICD-10-CM

## 2017-03-04 DIAGNOSIS — G301 Alzheimer's disease with late onset: Secondary | ICD-10-CM | POA: Diagnosis not present

## 2017-03-04 DIAGNOSIS — M8589 Other specified disorders of bone density and structure, multiple sites: Secondary | ICD-10-CM

## 2017-03-04 DIAGNOSIS — F0280 Dementia in other diseases classified elsewhere without behavioral disturbance: Secondary | ICD-10-CM

## 2017-03-04 DIAGNOSIS — M150 Primary generalized (osteo)arthritis: Secondary | ICD-10-CM | POA: Diagnosis not present

## 2017-03-04 DIAGNOSIS — N3281 Overactive bladder: Secondary | ICD-10-CM | POA: Diagnosis not present

## 2017-03-04 DIAGNOSIS — M159 Polyosteoarthritis, unspecified: Secondary | ICD-10-CM

## 2017-03-04 DIAGNOSIS — M8949 Other hypertrophic osteoarthropathy, multiple sites: Secondary | ICD-10-CM

## 2017-03-04 NOTE — Progress Notes (Signed)
Location:   [facility] Service:   clinic  Provider: Arye Weyenberg L. Mariea Clonts, D.O., C.M.D.  Code Status: DNR Goals of Care:  Advanced Directives 03/04/2017  Does Patient Have a Medical Advance Directive? Yes  Type of Paramedic of [place];Living will  Copy of [place] in Chart? Yes   Chief Complaint  Patient presents with  . Medical Management of Chronic Issues    39mth follow-up    HPI: Patient is a 83 y.o. female seen today for medical management of chronic diseases.   Has history of Alzheimer's Disease, hyperlipidemia, overactive bladder, & osteopenia. Mrs. Gradel is accompanied by her husband, Mr. Brumbaugh.   [facility] now, after [facility] here from [facility]. Recently moved from cottages, into main independent living building to provide safer environment for patient. Last time, started on Namenda. In last week or two, no more episodes of forgetting who her husband was, which they both consider a big improvement. Is no longer using her alarm button, since she is living in main building now. Last time, discussed installing louder doors, but this is no longer an issue, since living in main building now. No more episodes of getting lost, or escaping. Does her ADLs, and washes her dishes, but doesn't put them away. No falls, or urinary continence (is taking myrbetriq). Saw neurology, Dr. Delice Lesch, who advised continue Exelon and Namenda. Advised continue control of vascular risk factors and follow-up in 6 months. MMSE had worsened 15/30, from 19/30 in January.   BP elevated today 148/70. Does not take anything for Bp, could be a fluke. Will recheck. Improved to 120/70  Labs available today for review: CBC, TSH, E95, FOLIC ACID, LIPID PANEL, CMP.   Past Medical History:  Diagnosis Date  . Alzheimer's disease   . High cholesterol   . Uterine cancer [facility])     Past Surgical History:  Procedure  Laterality Date  . [facility]  . cataract surgery Bilateral     Allergies  Allergen Reactions  . Penicillins Hives  . Sulfa Antibiotics Hives    Allergies as of 03/04/2017      Reactions   Penicillins Hives   Sulfa Antibiotics Hives      Medication List       Accurate as of 03/04/17  9:55 AM. Always use your most recent med list.          GLUCOSAMINE CHONDR 1500 COMPLX PO Take 1 tablet by mouth 2 (two) times daily.   memantine 28 MG Cp24 24 hr capsule Commonly known as:  NAMENDA XR Take 1 capsule (28 mg total) by mouth daily.   multivitamin with minerals tablet Take 1 tablet by mouth daily.   MYRBETRIQ 50 MG Tb24 tablet Generic drug:  mirabegron ER Take 50 mg by mouth daily.   rivastigmine 9.5 mg/24hr Commonly known as:  EXELON Place 9.5 mg onto the skin daily.   simvastatin 20 MG tablet Commonly known as:  ZOCOR Take 20 mg by mouth daily.   Vitamin D3 2000 units Tabs Take 1 tablet by mouth daily.       Review of Systems:  Review of Systems  Constitutional: Negative for chills and fever.  HENT: Negative for congestion.   Eyes: Negative for blurred vision.  Respiratory: Negative for shortness of breath.   Cardiovascular: Negative for chest pain, palpitations and leg swelling.  Gastrointestinal: Negative  for abdominal pain, blood in stool, constipation and melena.  Genitourinary: Negative for dysuria, frequency and urgency.  Musculoskeletal: Negative for falls and myalgias.  Skin: Negative for itching and rash.  Neurological: Negative for dizziness, tingling, tremors, sensory change and loss of consciousness.  Endo/Heme/Allergies: Bruises/bleeds easily.  Psychiatric/Behavioral: Positive for memory loss. Negative for depression and hallucinations. The patient is not nervous/anxious and does not have insomnia.     Health Maintenance  Topic Date Due  . TETANUS/TDAP  12/05/1952  . PNA vac Low Risk Adult (2 of 2 - PCV13)  02/03/2013  . COLONOSCOPY  01/22/2017  . MAMMOGRAM  04/07/2017  . INFLUENZA VACCINE  Completed  . DEXA SCAN  Completed    Physical Exam: Vitals:   03/04/17 0944  BP: (!) 148/70  Pulse: 76  Temp: 98.5 F (36.9 C)  TempSrc: Oral  SpO2: 95%  Weight: 117 lb (53.1 kg)   Body mass index is 22.11 kg/m. Physical Exam  Constitutional: She appears well-developed and well-nourished. No distress.  Cardiovascular: Normal rate, regular rhythm, normal heart sounds and intact distal pulses.   Pulmonary/Chest: Effort normal and breath sounds normal. No respiratory distress.  Abdominal: Soft. Bowel sounds are normal. She exhibits no distension. There is no tenderness.  Musculoskeletal: Normal range of motion.  Neurological: She is alert.  Oriented to person today  Skin: Skin is warm and dry. Capillary refill takes less than 2 seconds.  Psychiatric: She has a normal mood and affect.  Very pleasant    Labs reviewed: Basic Metabolic Panel:  Recent Labs  02/24/17 0127  NA 145  K 4.8  BUN 20  CREATININE 0.7  TSH 3.70   Liver Function Tests:  Recent Labs  02/24/17 0127  AST 22  ALT 14  ALKPHOS 77   No results for input(s): LIPASE, AMYLASE in the last 8760 hours. No results for input(s): AMMONIA in the last 8760 hours. CBC:  Recent Labs  02/24/17 0127  WBC 5.8  HGB 13.0  HCT 39  PLT 215   Lipid Panel:  Recent Labs  02/24/17 0127  CHOL 151  HDL 60  LDLCALC 74  TRIG 87   Neurology notes reviewed.  Assessment/Plan 1. Late onset Alzheimer's disease without behavioral disturbance -continue exelon and namenda therapy, also follows with neurology -improved a bit per her husband since move to big house  Apt from home  2. OAB (overactive bladder) -stable with myrbetriq  3. Pure hypercholesterolemia -cont simvastatin--lipids at goal  4. Primary osteoarthritis involving multiple joints -no complaints of this today, doing well   5. Osteopenia of multiple  sites -cont vitamin D and weightbearing exercise  Labs/tests ordered:  No orders of the defined types were placed in this encounter.  Next appt:  4 mos med mgt  Berta Denson L. Emie Sommerfeld, D.O. [facility] [address], [place] [address] Cell Phone (Mon-Fri 8am-5pm):  [phone] On Call:  [phone] & follow prompts after 5pm & weekends Office Phone:  [phone] Office Fax:  [phone]

## 2017-04-10 ENCOUNTER — Other Ambulatory Visit: Payer: Self-pay | Admitting: *Deleted

## 2017-04-10 MED ORDER — SIMVASTATIN 20 MG PO TABS: 20.0000 mg | ORAL_TABLET | Freq: Every day | ORAL | 0 refills | Status: DC

## 2017-06-30 ENCOUNTER — Non-Acute Institutional Stay: Payer: Medicare Other

## 2017-06-30 VITALS — BP 140/78 | HR 72 | Temp 98.0°F | Ht 61.0 in | Wt 122.0 lb

## 2017-06-30 DIAGNOSIS — Z0000 Encounter for general adult medical examination without abnormal findings: Secondary | ICD-10-CM | POA: Diagnosis not present

## 2017-06-30 MED ORDER — TETANUS-DIPHTH-ACELL PERTUSSIS 5-2.5-18.5 LF-MCG/0.5 IM SUSP: 0.5000 mL | Freq: Once | INTRAMUSCULAR | 0 refills | Status: AC

## 2017-06-30 MED ORDER — ZOSTER VAC RECOMB ADJUVANTED 50 MCG/0.5ML IM SUSR: 0.5000 mL | Freq: Once | INTRAMUSCULAR | 1 refills | Status: AC

## 2017-06-30 NOTE — Patient Instructions (Signed)
Karen Mclean , Thank you for taking time to come for your Medicare Wellness Visit. I appreciate your ongoing commitment to your health goals. Please review the following plan we discussed and let me know if I can assist you in the future.   Screening recommendations/referrals: Colonoscopy up to date, pt over age 81 Mammogram up to date, pt over age 66 Bone Density up to date Recommended yearly ophthalmology/optometry visit for glaucoma screening and checkup Recommended yearly dental visit for hygiene and checkup  Vaccinations: Influenza vaccine up to date. Due 10/23/17 Pneumococcal vaccine, second vaccination is due. Karen Mclean will call you and schedule you to get it next week Tdap vaccine due, prescription will be sent to pharmacy Shingles vaccine due, prescription will be sent to pharmacy  Advanced directives: Please bring Karen Mclean a copy when you have it  Conditions/risks identified: None  Next appointment: None scheduled   Preventive Care 65 Years and Older, Female Preventive care refers to lifestyle choices and visits with your health care provider that can promote health and wellness. What does preventive care include?  A yearly physical exam. This is also called an annual well check.  Dental exams once or twice a year.  Routine eye exams. Ask your health care provider how often you should have your eyes checked.  Personal lifestyle choices, including:  Daily care of your teeth and gums.  Regular physical activity.  Eating a healthy diet.  Avoiding tobacco and drug use.  Limiting alcohol use.  Practicing safe sex.  Taking low-dose aspirin every day.  Taking vitamin and mineral supplements as recommended by your health care provider. What happens during an annual well check? The services and screenings done by your health care provider during your annual well check will depend on your age, overall health, lifestyle risk factors, and family history of disease. Counseling    Your health care provider may ask you questions about your:  Alcohol use.  Tobacco use.  Drug use.  Emotional well-being.  Home and relationship well-being.  Sexual activity.  Eating habits.  History of falls.  Memory and ability to understand (cognition).  Work and work Statistician.  Reproductive health. Screening  You may have the following tests or measurements:  Height, weight, and BMI.  Blood pressure.  Lipid and cholesterol levels. These may be checked every 5 years, or more frequently if you are over 1 years old.  Skin check.  Lung cancer screening. You may have this screening every year starting at age 28 if you have a 30-pack-year history of smoking and currently smoke or have quit within the past 15 years.  Fecal occult blood test (FOBT) of the stool. You may have this test every year starting at age 60.  Flexible sigmoidoscopy or colonoscopy. You may have a sigmoidoscopy every 5 years or a colonoscopy every 10 years starting at age 69.  Hepatitis C blood test.  Hepatitis B blood test.  Sexually transmitted disease (STD) testing.  Diabetes screening. This is done by checking your blood sugar (glucose) after you have not eaten for a while (fasting). You may have this done every 1-3 years.  Bone density scan. This is done to screen for osteoporosis. You may have this done starting at age 55.  Mammogram. This may be done every 1-2 years. Talk to your health care provider about how often you should have regular mammograms. Talk with your health care provider about your test results, treatment options, and if necessary, the need for more tests. Vaccines  Your health care provider may recommend certain vaccines, such as:  Influenza vaccine. This is recommended every year.  Tetanus, diphtheria, and acellular pertussis (Tdap, Td) vaccine. You may need a Td booster every 10 years.  Zoster vaccine. You may need this after age 17.  Pneumococcal 13-valent  conjugate (PCV13) vaccine. One dose is recommended after age 14.  Pneumococcal polysaccharide (PPSV23) vaccine. One dose is recommended after age 55. Talk to your health care provider about which screenings and vaccines you need and how often you need them. This information is not intended to replace advice given to you by your health care provider. Make sure you discuss any questions you have with your health care provider. Document Released: 01/11/2016 Document Revised: 09/03/2016 Document Reviewed: 10/16/2015 Elsevier Interactive Patient Education  2017 [place] Prevention in the Home Falls can cause injuries. They can happen to people of all ages. There are many things you can do to make your home safe and to help prevent falls. What can I do on the outside of my home?  Regularly fix the edges of walkways and driveways and fix any cracks.  Remove anything that might make you trip as you walk through a door, such as a raised step or threshold.  Trim any bushes or trees on the path to your home.  Use bright outdoor lighting.  Clear any walking paths of anything that might make someone trip, such as rocks or tools.  Regularly check to see if handrails are loose or broken. Make sure that both sides of any steps have handrails.  Any raised decks and porches should have guardrails on the edges.  Have any leaves, snow, or ice cleared regularly.  Use sand or salt on walking paths during winter.  Clean up any spills in your garage right away. This includes oil or grease spills. What can I do in the bathroom?  Use night lights.  Install grab bars by the toilet and in the tub and shower. Do not use towel bars as grab bars.  Use non-skid mats or decals in the tub or shower.  If you need to sit down in the shower, use a plastic, non-slip stool.  Keep the floor dry. Clean up any water that spills on the floor as soon as it happens.  Remove soap buildup in the tub or  shower regularly.  Attach bath mats securely with double-sided non-slip rug tape.  Do not have throw rugs and other things on the floor that can make you trip. What can I do in the bedroom?  Use night lights.  Make sure that you have a light by your bed that is easy to reach.  Do not use any sheets or blankets that are too big for your bed. They should not hang down onto the floor.  Have a firm chair that has side arms. You can use this for support while you get dressed.  Do not have throw rugs and other things on the floor that can make you trip. What can I do in the kitchen?  Clean up any spills right away.  Avoid walking on wet floors.  Keep items that you use a lot in easy-to-reach places.  If you need to reach something above you, use a strong step stool that has a grab bar.  Keep electrical cords out of the way.  Do not use floor polish or wax that makes floors slippery. If you must use wax, use non-skid floor wax.  Do  not have throw rugs and other things on the floor that can make you trip. What can I do with my stairs?  Do not leave any items on the stairs.  Make sure that there are handrails on both sides of the stairs and use them. Fix handrails that are broken or loose. Make sure that handrails are as long as the stairways.  Check any carpeting to make sure that it is firmly attached to the stairs. Fix any carpet that is loose or worn.  Avoid having throw rugs at the top or bottom of the stairs. If you do have throw rugs, attach them to the floor with carpet tape.  Make sure that you have a light switch at the top of the stairs and the bottom of the stairs. If you do not have them, ask someone to add them for you. What else can I do to help prevent falls?  Wear shoes that:  Do not have high heels.  Have rubber bottoms.  Are comfortable and fit you well.  Are closed at the toe. Do not wear sandals.  If you use a stepladder:  Make sure that it is fully  opened. Do not climb a closed stepladder.  Make sure that both sides of the stepladder are locked into place.  Ask someone to hold it for you, if possible.  Clearly mark and make sure that you can see:  Any grab bars or handrails.  First and last steps.  Where the edge of each step is.  Use tools that help you move around (mobility aids) if they are needed. These include:  Canes.  Walkers.  Scooters.  Crutches.  Turn on the lights when you go into a dark area. Replace any light bulbs as soon as they burn out.  Set up your furniture so you have a clear path. Avoid moving your furniture around.  If any of your floors are uneven, fix them.  If there are any pets around you, be aware of where they are.  Review your medicines with your doctor. Some medicines can make you feel dizzy. This can increase your chance of falling. Ask your doctor what other things that you can do to help prevent falls. This information is not intended to replace advice given to you by your health care provider. Make sure you discuss any questions you have with your health care provider. Document Released: 10/11/2009 Document Revised: 05/22/2016 Document Reviewed: 01/19/2015 Elsevier Interactive Patient Education  2017 [employer].

## 2017-06-30 NOTE — Progress Notes (Signed)
Subjective:   Karen Mclean is a 83 y.o. female who presents for an Initial Medicare Annual Wellness Visit at [facility]       Objective:    Today's Vitals   06/30/17 1423  BP: 140/78  Pulse: 72  Temp: 98 F (36.7 C)  TempSrc: Oral  SpO2: 96%  Weight: 122 lb (55.3 kg)  Height: 5\' 1"  (1.549 m)   Body mass index is 23.05 kg/m.   Current Medications (verified) Outpatient Encounter Prescriptions as of 06/30/2017  Medication Sig  . Cholecalciferol (VITAMIN D3) 2000 units TABS Take 1 tablet by mouth daily.  . Glucosamine-Chondroit-Vit C-Mn (GLUCOSAMINE CHONDR 1500 COMPLX PO) Take 1 tablet by mouth 2 (two) times daily.  . memantine (NAMENDA XR) 28 MG CP24 24 hr capsule Take 1 capsule (28 mg total) by mouth daily.  . mirabegron ER (MYRBETRIQ) 50 MG TB24 tablet Take 50 mg by mouth daily.  . Multiple Vitamins-Minerals (MULTIVITAMIN WITH MINERALS) tablet Take 1 tablet by mouth daily.  . rivastigmine (EXELON) 9.5 mg/24hr Place 9.5 mg onto the skin daily.  . simvastatin (ZOCOR) 20 MG tablet Take 1 tablet (20 mg total) by mouth daily.   No facility-administered encounter medications on file as of 06/30/2017.     Allergies (verified) Penicillins and Sulfa antibiotics   History: Past Medical History:  Diagnosis Date  . Alzheimer's disease   . High cholesterol   . Uterine cancer [facility])    Past Surgical History:  Procedure Laterality Date  . [facility]  . cataract surgery Bilateral    Family History  Problem Relation Age of Onset  . Suicidality Brother   . Heart failure Brother   . Leukemia Sister    Social History   Occupational History  . Education officer, museum    Social History Main Topics  . Smoking status: Never Smoker  . Smokeless tobacco: Former Systems developer  . Alcohol use 1.2 - 1.8 oz/week    1 Glasses of wine, 1 - 2 Standard drinks or equivalent per week     Comment: 1/2 glass of wine a night  . Drug use:  No  . Sexual activity: Not on file    Tobacco Counseling Counseling given: Not Answered   Activities of Daily Living In your present state of health, do you have any difficulty performing the following activities: 06/30/2017 08/13/2016  Hearing? N N  Vision? N N  Difficulty concentrating or making decisions? Tempie Donning  Walking or climbing stairs? N N  Dressing or bathing? N N  Doing errands, shopping? Tempie Donning  Preparing Food and eating ? Y Y  Using the Toilet? Y N  In the past six months, have you accidently leaked urine? N Y  Do you have problems with loss of bowel control? N N  Managing your Medications? Y Y  Managing your Finances? Tempie Donning  Housekeeping or managing your Housekeeping? Tempie Donning  Some recent data might be hidden    Immunizations and Health Maintenance Immunization History  Administered Date(s) Administered  . Influenza-Unspecified 10/31/2015, 10/23/2016  . Pneumococcal Polysaccharide-23 02/04/2012   Health Maintenance Due  Topic Date Due  . TETANUS/TDAP  12/05/1952  . PNA vac Low Risk Adult (2 of 2 - PCV13) 02/03/2013  . COLONOSCOPY  01/22/2017  . MAMMOGRAM  04/07/2017    Patient Care Team: Gayland Curry, DO as PCP - General (Geriatric Medicine) Addison Lank, MD as Consulting Physician (Dermatology)  Indicate any  recent Medical Services you may have received from other than [facility] providers in the past year (date may be approximate).     Assessment:   This is a routine wellness examination for [name].   Hearing/Vision screen No exam data present  Dietary issues and exercise activities discussed: Current Exercise Habits: Home exercise routine, Type of exercise: walking, Time (Minutes): 20, Frequency (Times/Week): 5, Weekly Exercise (Minutes/Week): 100, Intensity: Mild, Exercise limited by: neurologic condition(s)  Goals    . Maintain Lifestyle      Depression Screen PHQ 2/9 Scores 06/30/2017 03/04/2017 01/07/2017 08/13/2016  PHQ - 2 Score 0 0 0 0    Fall Risk Fall  Risk  06/30/2017 03/04/2017 02/23/2017 01/07/2017 08/13/2016  Falls in the past year? No No No No No    Cognitive Function: MMSE - Mini Mental State Exam 02/23/2017 02/23/2017 01/07/2017  Orientation to time 0 0 0  Orientation to Place 3 3 1   Registration 3 3 3   Attention/ Calculation 0 0 4  Recall 0 0 3  Language- name 2 objects 2 2 2   Language- repeat 1 1 1   Language- follow 3 step command 3 3 3   Language- read & follow direction 1 1 1   Write a sentence 1 1 1   Copy design 1 1 0  Total score 15 15 19         Screening Tests Health Maintenance  Topic Date Due  . TETANUS/TDAP  12/05/1952  . PNA vac Low Risk Adult (2 of 2 - PCV13) 02/03/2013  . COLONOSCOPY  01/22/2017  . MAMMOGRAM  04/07/2017  . INFLUENZA VACCINE  07/29/2017  . DEXA SCAN  Completed      Plan:    I have personally reviewed and addressed the Medicare Annual Wellness questionnaire and have noted the following in the patient's chart:  A. Medical and social history B. Use of alcohol, tobacco or illicit drugs  C. Current medications and supplements D. Functional ability and status E.  Nutritional status F.  Physical activity G. Advance directives H. List of other physicians I.  Hospitalizations, surgeries, and ER visits in previous 12 months J.  [place] to include hearing, vision, cognitive, depression L. Referrals and appointments - none  In addition, I have reviewed and discussed with patient certain preventive protocols, quality metrics, and best practice recommendations. A written personalized care plan for preventive services as well as general preventive health recommendations were provided to patient.  See attached scanned questionnaire for additional information.   Signed,   Rich Reining, RN Nurse Health Advisor   Quick Notes   Health Maintenance: PNA 13 due-out currently, [name] will call and schedule her to return and get it. TDAP and shingles prescriptions sent to  pharmacy     Abnormal Screen: MMSE 15/30 on 02/23/17     Patient Concerns: None     Nurse Concerns: None

## 2017-08-24 ENCOUNTER — Encounter: Payer: Self-pay | Admitting: Neurology

## 2017-08-24 ENCOUNTER — Ambulatory Visit (INDEPENDENT_AMBULATORY_CARE_PROVIDER_SITE_OTHER): Payer: Medicare Other | Admitting: Neurology

## 2017-08-24 VITALS — BP 136/70 | HR 70 | Ht 61.0 in | Wt 124.0 lb

## 2017-08-24 DIAGNOSIS — F0390 Unspecified dementia without behavioral disturbance: Secondary | ICD-10-CM | POA: Diagnosis not present

## 2017-08-24 DIAGNOSIS — F03B0 Unspecified dementia, moderate, without behavioral disturbance, psychotic disturbance, mood disturbance, and anxiety: Secondary | ICD-10-CM

## 2017-08-24 NOTE — Patient Instructions (Signed)
1. Continue Exelon patch and Namenda XR 2. Control of blood pressure, cholesterol, as well as physical exercise and brain stimulation exercises are important for brain health 3. Follow-up in 6 months, call for any changes

## 2017-08-24 NOTE — Progress Notes (Signed)
NEUROLOGY FOLLOW UP OFFICE NOTE  Karen Mclean [id number] June 26, 1941  HISTORY OF PRESENT ILLNESS: I had the pleasure of seeing Karen Mclean in follow-up in the neurology clinic on 08/24/2017.  The patient was last seen 6 months ago for moderate dementia. She is again accompanied by her husband who helps supplement the history today.  MMSE in February 2018 was 15/30. She is on the Exelon patch 9.5mg /day and Namenda XR 28mg  daily. She thinks her memory is okay, "but he might not." Her husband reports worsening memory, she wants to help in the kitchen but puts things back in the wrong place and he would have to go hunting for them. She is able to dress herself, but if she would choose her own clothes, they would be weather-inappropriate. They go to the pool and one time she went into another person's locker and came out wearing their clothes. She cannot find her locker so she just tucks her clothes under her arm and brings them out. There have been fewer episodes where she does not recognize her husband, but last week she was a little suspicious, asking him if he was staying the night. He denies any hallucinations but she talks about going back to her mother who has passed away many years ago, or going to [place]. He shows videos of her looking through the phone directory to call someone in [place] to pick her up. She denies any headaches, dizziness, diplopia, dysarthria, dysphagia, neck/back pain, focal numbness/tingling/weakness, bowel/bladder dysfunction. She sleeps well. No falls.   HPI 02/23/2017: This is a very pleasant 83 year old right-handed woman with a history of hyperlipidemia, uterine cancer, and Alzheimer's dementia, presenting to establish care. She had previously been living in [place], [place] and moved to [place] last August 2017 to be closer to her son. She and her husband live at a continuing care retirement home ([facility]). She thinks her memory is "pretty good." She then states  that she lives in [place] and right now she is visiting with her husband who lives in [place]. She states "right now I am staying at your house." Her family started to notice memory changes in 2015, she was repeating herself. She was driving erratically and would miss a stop sign. She saw neurologist Dr. Eulis Manly who did several tests and was diagnosed with Alzheimer's disease. She has been on the Exelon patch since the Spring of 2016 with no side effects. She does not drive anymore. Her husband is in charge of bills. She states she is pretty good with remembering her medications, he husband shakes his head. She does not cook, they usually eat at the dining room in the facility. One time she brought back iced tea and he told her to put it in the fridge. She walked to the bathroom then the bedroom looking for the fridge, then came back with the cup in her hand not knowing what to do with it. She does not recognize her husband 1-2 times a week, especially at night. A month or so ago, she got out of bed at midnight and did not know who he was. She wanted to sleep in the living room, then 30 minutes later he heard the doorbell and found that security came because she went to a neighbor and said there was a stranger in the house. They are now in a more secure locked apartment with closer supervision. Sometimes she cannot recall her son's name and confuses him with her brother. She  occasionally says she wants to go back to [place] and get a job. She occasionally thinks there is someone else in the room or talks about people coming to see them at night. Her husband does the laundry. She tries to help with the dishes but only rinses with lukewarm water and leaves them out. She can dress and bathe independently, but does not like to bathe. Her husband instead brings her to the jacuzzi three times a week. He denies any personality changes. Her MMSE with her PCP in January 2018 was 19/30, Namenda XR was added on  to Exelon, which she is tolerating without side effects. No family history of dementia. She denies any significant head injuries. She drinks a small glass of wine with dinner every night. She is a Forensic psychologist and retired Education officer, museum.  PAST MEDICAL HISTORY: Past Medical History:  Diagnosis Date  . Alzheimer's disease   . High cholesterol   . Uterine cancer [facility])     MEDICATIONS: Current Outpatient Prescriptions on File Prior to Visit  Medication Sig Dispense Refill  . Cholecalciferol (VITAMIN D3) 2000 units TABS Take 1 tablet by mouth daily.    . Glucosamine-Chondroit-Vit C-Mn (GLUCOSAMINE CHONDR 1500 COMPLX PO) Take 1 tablet by mouth 2 (two) times daily.    . memantine (NAMENDA XR) 28 MG CP24 24 hr capsule Take 1 capsule (28 mg total) by mouth daily. 90 capsule 3  . mirabegron ER (MYRBETRIQ) 50 MG TB24 tablet Take 50 mg by mouth daily.    . Multiple Vitamins-Minerals (MULTIVITAMIN WITH MINERALS) tablet Take 1 tablet by mouth daily.    . rivastigmine (EXELON) 9.5 mg/24hr Place 9.5 mg onto the skin daily.    . simvastatin (ZOCOR) 20 MG tablet Take 1 tablet (20 mg total) by mouth daily. 90 tablet 0   No current facility-administered medications on file prior to visit.     ALLERGIES: Allergies  Allergen Reactions  . Penicillins Hives  . Sulfa Antibiotics Hives    FAMILY HISTORY: Family History  Problem Relation Age of Onset  . Suicidality Brother   . Heart failure Brother   . Leukemia Sister     SOCIAL HISTORY: Social History   Social History  . Marital status: Married    Spouse name: N/A  . Number of children: 2  . Years of education: N/A   Occupational History  . Education officer, museum    Social History Main Topics  . Smoking status: Never Smoker  . Smokeless tobacco: Former Systems developer  . Alcohol use 1.2 - 1.8 oz/week    1 Glasses of wine, 1 - 2 Standard drinks or equivalent per week     Comment: 1/2 glass of wine a night  . Drug use: No  . Sexual activity: Not on  file   Other Topics Concern  . Not on file   Social History Narrative   Diet? No      Do you drink/eat things with caffeine?  No      Marital status?     Married                               What year were you married?  1960      Do you live in a house, apartment, assisted living, condo, trailer, etc.?  [place]      Is it one or more stories? One      How many persons live in  your home? 2      Do you have any pets in your home? (please list) No      Current or past profession:  Retired Education officer, museum      Do you exercise?               yes                       Type & how often?  Walk daily      Do you have a living will? no      Do you have a DNR form?     no                             If not, do you want to discuss one? yes      Do you have signed POA/HPOA for forms? yes          REVIEW OF SYSTEMS: Constitutional: No fevers, chills, or sweats, no generalized fatigue, change in appetite Eyes: No visual changes, double vision, eye pain Ear, nose and throat: No hearing loss, ear pain, nasal congestion, sore throat Cardiovascular: No chest pain, palpitations Respiratory:  No shortness of breath at rest or with exertion, wheezes GastrointestinaI: No nausea, vomiting, diarrhea, abdominal pain, fecal incontinence Genitourinary:  No dysuria, urinary retention or frequency Musculoskeletal:  No neck pain, back pain Integumentary: No rash, pruritus, skin lesions Neurological: as above Psychiatric: No depression, insomnia, anxiety Endocrine: No palpitations, fatigue, diaphoresis, mood swings, change in appetite, change in weight, increased thirst Hematologic/Lymphatic:  No anemia, purpura, petechiae. Allergic/Immunologic: no itchy/runny eyes, nasal congestion, recent allergic reactions, rashes  PHYSICAL EXAM: Vitals:   08/24/17 1523  BP: 136/70  Pulse: 70  SpO2: 96%   General: No acute distress Head:  Normocephalic/atraumatic Neck: supple, no  paraspinal tenderness, full range of motion Heart:  Regular rate and rhythm Lungs:  Clear to auscultation bilaterally Back: No paraspinal tenderness Skin/Extremities: No rash, no edema Neurological Exam: alert and oriented to person, place, and time. No aphasia or dysarthria. Fund of knowledge is appropriate.  Recent and remote memory are intact.  Attention and concentration are normal.    Able to name objects and repeat phrases.  MMSE - Mini Mental State Exam 08/24/2017 02/23/2017 02/23/2017  Orientation to time 1 0 0  Orientation to Place 4 3 3   Registration 3 3 3   Attention/ Calculation 0 0 0  Recall 0 0 0  Language- name 2 objects 2 2 2   Language- repeat 1 1 1   Language- follow 3 step command 3 3 3   Language- read & follow direction 1 1 1   Write a sentence 1 1 1   Copy design 0 1 1  Total score 16 15 15    Cranial nerves: Pupils equal, round, reactive to light.  Extraocular movements intact with no nystagmus. Visual fields full. Facial sensation intact. No facial asymmetry. Tongue, uvula, palate midline.  Motor: Bulk and tone normal, muscle strength 5/5 throughout with no pronator drift.  Sensation to light touch intact.  No extinction to double simultaneous stimulation.  Deep tendon reflexes +1 throughout, toes downgoing.  Finger to nose testing intact.  Gait narrow-based and steady, mild difficulty with tandem walk but able.  Romberg negative.  IMPRESSION: This is a very pleasant 83 yo RH woman with hyperlipidemia, uterine cancer, and Alzheimer's disease. Her MMSE today is 16/30 (15/30 in February 2018), indicating moderate dementia. She  is on the Exelon patch and Namenda XR 28mg  daily without side effects. Her husband reports continued worsening of memory, no behavioral changes. We discussed that if behavioral symptoms become problematic, we may consider starting a medication such as Seroquel. Continue control of vascular risk factors, physical exercise, and brain stimulation exercises for  brain health. She will follow-up in 6 months and knows to call for any changes  Thank you for allowing me to participate in her care.  Please do not hesitate to call for any questions or concerns.  The duration of this appointment visit was 25 minutes of face-to-face time with the patient.  Greater than 50% of this time was spent in counseling, explanation of diagnosis, planning of further management, and coordination of care.   Ellouise Newer, M.D.   CC: Dr. Mariea Clonts

## 2017-08-31 ENCOUNTER — Encounter: Payer: Self-pay | Admitting: Neurology

## 2017-09-06 ENCOUNTER — Other Ambulatory Visit: Payer: Self-pay | Admitting: Internal Medicine

## 2017-10-22 DIAGNOSIS — Z23 Encounter for immunization: Secondary | ICD-10-CM | POA: Diagnosis not present

## 2017-11-12 ENCOUNTER — Other Ambulatory Visit: Payer: Self-pay | Admitting: *Deleted

## 2017-11-12 MED ORDER — MIRABEGRON ER 50 MG PO TB24: 50.0000 mg | ORAL_TABLET | Freq: Every day | ORAL | 1 refills | Status: DC

## 2017-11-12 NOTE — Telephone Encounter (Signed)
[facility]

## 2017-11-19 ENCOUNTER — Other Ambulatory Visit: Payer: Self-pay | Admitting: Internal Medicine

## 2017-12-14 ENCOUNTER — Other Ambulatory Visit: Payer: Self-pay | Admitting: Internal Medicine

## 2018-01-30 ENCOUNTER — Other Ambulatory Visit: Payer: Self-pay | Admitting: Internal Medicine

## 2018-02-01 NOTE — Telephone Encounter (Signed)
Noted...[facility]  °

## 2018-02-23 ENCOUNTER — Ambulatory Visit (INDEPENDENT_AMBULATORY_CARE_PROVIDER_SITE_OTHER): Payer: Medicare Other | Admitting: Neurology

## 2018-02-23 ENCOUNTER — Other Ambulatory Visit: Payer: Self-pay

## 2018-02-23 VITALS — BP 132/64 | HR 66 | Ht 60.0 in | Wt 125.0 lb

## 2018-02-23 DIAGNOSIS — F0390 Unspecified dementia without behavioral disturbance: Secondary | ICD-10-CM

## 2018-02-23 DIAGNOSIS — F03B0 Unspecified dementia, moderate, without behavioral disturbance, psychotic disturbance, mood disturbance, and anxiety: Secondary | ICD-10-CM

## 2018-02-23 NOTE — Patient Instructions (Signed)
1. Continue all your medications, let us know when ready for refills 2. Follow-up in 6 months, call for any changes  FALL PRECAUTIONS: Be cautious when walking. Scan the area for obstacles that may increase the risk of trips and falls. When getting up in the mornings, sit up at the edge of the bed for a few minutes before getting out of bed. Consider elevating the bed at the head end to avoid drop of blood pressure when getting up. Walk always in a well-lit room (use night lights in the walls). Avoid area rugs or power cords from appliances in the middle of the walkways. Use a walker or a cane if necessary and consider physical therapy for balance exercise. Get your eyesight checked regularly.  FINANCIAL OVERSIGHT: Supervision, especially oversight when making financial decisions or transactions is also recommended.  HOME SAFETY: Consider the safety of the kitchen when operating appliances like stoves, microwave oven, and blender. Consider having supervision and share cooking responsibilities until no longer able to participate in those. Accidents with firearms and other hazards in the house should be identified and addressed as well.  DRIVING: Regarding driving, in patients with progressive memory problems, driving will be impaired. We advise to have someone else do the driving if trouble finding directions or if minor accidents are reported. Independent driving assessment is available to determine safety of driving.  ABILITY TO BE LEFT ALONE: If patient is unable to contact 911 operator, consider using LifeLine, or when the need is there, arrange for someone to stay with patients. Smoking is a fire hazard, consider supervision or cessation. Risk of wandering should be assessed by caregiver and if detected at any point, supervision and safe proof recommendations should be instituted.  MEDICATION SUPERVISION: Inability to self-administer medication needs to be constantly addressed. Implement a mechanism  to ensure safe administration of the medications.  RECOMMENDATIONS FOR ALL PATIENTS WITH MEMORY PROBLEMS: 1. Continue to exercise (Recommend 30 minutes of walking everyday, or 3 hours every week) 2. Increase social interactions - continue going to [place] and enjoy social gatherings with friends and family 3. Eat healthy, avoid fried foods and eat more fruits and vegetables 4. Maintain adequate blood pressure, blood sugar, and blood cholesterol level. Reducing the risk of stroke and cardiovascular disease also helps promoting better memory. 5. Avoid stressful situations. Live a simple life and avoid aggravations. Organize your time and prepare for the next day in anticipation. 6. Sleep well, avoid any interruptions of sleep and avoid any distractions in the bedroom that may interfere with adequate sleep quality 7. Avoid sugar, avoid sweets as there is a strong link between excessive sugar intake, diabetes, and cognitive impairment We discussed the Mediterranean diet, which has been shown to help patients reduce the risk of progressive memory disorders and reduces cardiovascular risk. This includes eating fish, eat fruits and green leafy vegetables, nuts like almonds and hazelnuts, walnuts, and also use olive oil. Avoid fast foods and fried foods as much as possible. Avoid sweets and sugar as sugar use has been linked to worsening of memory function.  There is always a concern of gradual progression of memory problems. If this is the case, then we may need to adjust level of care according to patient needs. Support, both to the patient and caregiver, should then be put into place.

## 2018-02-23 NOTE — Progress Notes (Signed)
NEUROLOGY FOLLOW UP OFFICE NOTE  Karen Mclean [id number] 1933/07/08  HISTORY OF PRESENT ILLNESS: I had the pleasure of seeing Karen Mclean in follow-up in the neurology clinic on 02/23/2018.  The patient was last seen 6 months ago for moderate dementia. She is again accompanied by her husband who helps supplement the history today.  MMSE in August 2018 was 16/30 (15/30 in February). She is on the Exelon patch 9.5mg /day and Namenda XR 28mg  daily. She thinks her memory is okay. Her husband brings a list of events since their last visit.She can answer the landline but not a cell phone. She cannot dial with either one. Sometimes she manages to get a bowel movement into a wad of toilet paper and rolls it up into a ball, throws it in the trash, saying it's too much to flush in the toilet. Occasionally she takes toilet paper and stuffs it into the bathroom drawer, some appear used. She cannot explain why she did this. She cannot help prepare meals. She cannot use the dishwasher. She tries to help clean up but puts items in the wrong drawer. Sometimes she asks her husband at night if he is spending the night, as if he is just visiting. Frequently she says she is going home tomorrow to stay with her mother (who passed away 28 years ago). One time she insisted he call their son to take her home the next day. When they go to the pool, she used to be able to follow instructions to go to the locker and change into her bathing suit. Lately, she puts her clothes back on over her bathing suit before coming to the pool, or wanders into the hallway outside the entrance just wearing the bathing suit. Since then, he has gotten her a cover up she can put over the bathing suit. In January, they were in an auditorium and he had to go help on the stage. He told her to stay in her seat, but when he returned 15 minutes later, she was gone. He went back to their room which was 2 minutes away and she was inside. She does not have  a key card, she asked housekeepers to open the door for her. She did not remember this. When she reads, she slides her finger along the printed words, which is new. She never puts her clothes away but lays them on a table or floor.  When asked about medications, she states she does not take anything. Her husband reminds her he administers them, she asks him, "do you give them to me?" She called her husband her brother today in the office, she knows his name, then asked "we are married?" Sleep is good, she does not wander. Mood is pretty cheerful, no paranoia. There may be some hallucinations, at times she wonders where the people are that were in the room earlier. Sometimes she does not recognize her husband, worse in the evening. Appetite is good. Overall her physical health is good. She denies any headaches, dizziness, diplopia, dysarthria, dysphagia, neck/back pain, focal numbness/tingling/weakness, bowel/bladder dysfunction. No falls.   HPI 02/23/2017: This is a very pleasant 84 year old right-handed woman with a history of hyperlipidemia, uterine cancer, and Alzheimer's dementia, presenting to establish care. She had previously been living in [place], [place] and moved to [place] last August 2017 to be closer to her son. She and her husband live at a continuing care retirement home ([facility]). She thinks her memory is "pretty good." She  then states that she lives in [place] and right now she is visiting with her husband who lives in [place]. She states "right now I am staying at your house." Her family started to notice memory changes in 2015, she was repeating herself. She was driving erratically and would miss a stop sign. She saw neurologist Dr. Eulis Manly who did several tests and was diagnosed with Alzheimer's disease. She has been on the Exelon patch since the Spring of 2016 with no side effects. She does not drive anymore. Her husband is in charge of bills. She states she is pretty good  with remembering her medications, he husband shakes his head. She does not cook, they usually eat at the dining room in the facility. One time she brought back iced tea and he told her to put it in the fridge. She walked to the bathroom then the bedroom looking for the fridge, then came back with the cup in her hand not knowing what to do with it. She does not recognize her husband 1-2 times a week, especially at night. A month or so ago, she got out of bed at midnight and did not know who he was. She wanted to sleep in the living room, then 30 minutes later he heard the doorbell and found that security came because she went to a neighbor and said there was a stranger in the house. They are now in a more secure locked apartment with closer supervision. Sometimes she cannot recall her son's name and confuses him with her brother. She occasionally says she wants to go back to [place] and get a job. She occasionally thinks there is someone else in the room or talks about people coming to see them at night. Her husband does the laundry. She tries to help with the dishes but only rinses with lukewarm water and leaves them out. She can dress and bathe independently, but does not like to bathe. Her husband instead brings her to the jacuzzi three times a week. He denies any personality changes. Her MMSE with her PCP in January 2018 was 19/30, Namenda XR was added on to Exelon, which she is tolerating without side effects. No family history of dementia. She denies any significant head injuries. She drinks a small glass of wine with dinner every night. She is a Forensic psychologist and retired Education officer, museum.  PAST MEDICAL HISTORY: Past Medical History:  Diagnosis Date  . Alzheimer's disease   . High cholesterol   . Uterine cancer [facility])     MEDICATIONS: Current Outpatient Medications on File Prior to Visit  Medication Sig Dispense Refill  . Cholecalciferol (VITAMIN D3) 2000 units TABS Take 1 tablet by mouth daily.     . Glucosamine-Chondroit-Vit C-Mn (GLUCOSAMINE CHONDR 1500 COMPLX PO) Take 1 tablet by mouth 2 (two) times daily.    . memantine (NAMENDA XR) 28 MG CP24 24 hr capsule APPOINTMENT DUE, 1 by mouth daily 90 capsule 0  . mirabegron ER (MYRBETRIQ) 50 MG TB24 tablet Take 1 tablet (50 mg total) daily by mouth. 90 tablet 1  . Multiple Vitamins-Minerals (MULTIVITAMIN WITH MINERALS) tablet Take 1 tablet by mouth daily.    . rivastigmine (EXELON) 9.5 mg/24hr Place 9.5 mg onto the skin daily.    . simvastatin (ZOCOR) 20 MG tablet TAKE 1 TABLET DAILY 90 tablet 0   No current facility-administered medications on file prior to visit.     ALLERGIES: Allergies  Allergen Reactions  . Penicillins Hives  . Sulfa Antibiotics  Hives    FAMILY HISTORY: Family History  Problem Relation Age of Onset  . Suicidality Brother   . Heart failure Brother   . Leukemia Sister     SOCIAL HISTORY: Social History   Socioeconomic History  . Marital status: Married    Spouse name: Not on file  . Number of children: 2  . Years of education: Not on file  . Highest education level: Not on file  Social Needs  . Financial resource strain: Not on file  . Food insecurity - worry: Not on file  . Food insecurity - inability: Not on file  . Transportation needs - medical: Not on file  . Transportation needs - non-medical: Not on file  Occupational History  . Occupation: Education officer, museum  Tobacco Use  . Smoking status: Never Smoker  . Smokeless tobacco: Former Network engineer and Sexual Activity  . Alcohol use: Yes    Alcohol/week: 1.2 - 1.8 oz    Types: 1 Glasses of wine, 1 - 2 Standard drinks or equivalent per week    Comment: 1/2 glass of wine a night  . Drug use: No  . Sexual activity: Not on file  Other Topics Concern  . Not on file  Social History Narrative   Diet? No      Do you drink/eat things with caffeine?  No      Marital status?     Married                               What year were you  married?  1960      Do you live in a house, apartment, assisted living, condo, trailer, etc.?  [place]      Is it one or more stories? One      How many persons live in your home? 2      Do you have any pets in your home? (please list) No      Current or past profession:  Retired Education officer, museum      Do you exercise?               yes                       Type & how often?  Walk daily      Do you have a living will? no      Do you have a DNR form?     no                             If not, do you want to discuss one? yes      Do you have signed POA/HPOA for forms? yes       REVIEW OF SYSTEMS: Constitutional: No fevers, chills, or sweats, no generalized fatigue, change in appetite Eyes: No visual changes, double vision, eye pain Ear, nose and throat: No hearing loss, ear pain, nasal congestion, sore throat Cardiovascular: No chest pain, palpitations Respiratory:  No shortness of breath at rest or with exertion, wheezes GastrointestinaI: No nausea, vomiting, diarrhea, abdominal pain, fecal incontinence Genitourinary:  No dysuria, urinary retention or frequency Musculoskeletal:  No neck pain, back pain Integumentary: No rash, pruritus, skin lesions Neurological: as above Psychiatric: No depression, insomnia, anxiety Endocrine: No palpitations, fatigue, diaphoresis, mood swings, change in appetite, change in weight, increased thirst  Hematologic/Lymphatic:  No anemia, purpura, petechiae. Allergic/Immunologic: no itchy/runny eyes, nasal congestion, recent allergic reactions, rashes  PHYSICAL EXAM: Vitals:   02/23/18 1605  BP: 132/64  Pulse: 66  SpO2: 94%   General: No acute distress Head:  Normocephalic/atraumatic Neck: supple, no paraspinal tenderness, full range of motion Heart:  Regular rate and rhythm Lungs:  Clear to auscultation bilaterally Back: No paraspinal tenderness Skin/Extremities: No rash, no edema Neurological Exam: alert and oriented to  person, says they are "where we come for help." No aphasia or dysarthria. Fund of knowledge is reduced.  Recent and remote memory are impaired.  Attention and concentration are normal.    Able to name objects and repeat phrases. CDT 0/5 MMSE - Mini Mental State Exam 02/23/2018 08/24/2017 02/23/2017  Orientation to time 0 1 0  Orientation to Place 1 4 3   Registration 3 3 3   Attention/ Calculation 0 0 0  Recall 0 0 0  Language- name 2 objects 1 2 2   Language- repeat 1 1 1   Language- follow 3 step command 2 3 3   Language- read & follow direction 1 1 1   Write a sentence 1 1 1   Copy design 0 0 1  Total score 10 16 15    Cranial nerves: Pupils equal, round, reactive to light.  Extraocular movements intact with no nystagmus. Visual fields full. Facial sensation intact. No facial asymmetry. Tongue, uvula, palate midline.  Motor: Bulk and tone normal, muscle strength 5/5 throughout with no pronator drift.  Sensation to light touch intact.  No extinction to double simultaneous stimulation.  Deep tendon reflexes +1 throughout, toes downgoing.  Finger to nose testing intact.  Gait narrow-based and steady, mild difficulty with tandem walk but able.  Romberg negative.  IMPRESSION: This is a very pleasant 84 yo RH woman with hyperlipidemia, uterine cancer, and Alzheimer's disease. Her MMSE today is 10/30 (16/30 in August 2018, 15/30 in February 2018). She continues to gradually decline. She is on the Exelon patch and Namenda XR 28mg  daily without side effects. No behavioral changes. We discussed that if behavioral symptoms become problematic, we may consider starting a medication such as Seroquel. Once she runs out of refills, he will call our office so we can send refills for Exelon and Namenda (previously prescribed by her prior neurologist). Continue 24/7 supervision, we discussed eventual need for more help either in their apartment, or moving to Memory Care at [facility]. Continue control of vascular risk  factors, physical exercise, and brain stimulation exercises for brain health. She will follow-up in 6 months and knows to call for any changes  Thank you for allowing me to participate in her care.  Please do not hesitate to call for any questions or concerns.  The duration of this appointment visit was 25 minutes of face-to-face time with the patient.  Greater than 50% of this time was spent in counseling, explanation of diagnosis, planning of further management, and coordination of care.   Ellouise Newer, M.D.   CC: Dr. Mariea Clonts

## 2018-02-24 ENCOUNTER — Encounter: Payer: Self-pay | Admitting: Neurology

## 2018-04-01 ENCOUNTER — Other Ambulatory Visit: Payer: Self-pay | Admitting: Internal Medicine

## 2018-04-19 ENCOUNTER — Other Ambulatory Visit: Payer: Self-pay | Admitting: Internal Medicine

## 2018-06-03 ENCOUNTER — Encounter: Payer: Self-pay | Admitting: Podiatry

## 2018-06-03 ENCOUNTER — Ambulatory Visit (INDEPENDENT_AMBULATORY_CARE_PROVIDER_SITE_OTHER): Payer: Medicare Other | Admitting: Podiatry

## 2018-06-03 DIAGNOSIS — B351 Tinea unguium: Secondary | ICD-10-CM | POA: Diagnosis not present

## 2018-06-03 DIAGNOSIS — M79609 Pain in unspecified limb: Secondary | ICD-10-CM | POA: Diagnosis not present

## 2018-06-03 NOTE — Progress Notes (Signed)
  Subjective:  Patient ID: Karen Mclean, female    DOB: 1933/11/10,  MRN: [id number]  Chief Complaint  Patient presents with  . Nail Problem    discolored, thick toenails   84 y.o. female returns for the above complaint.  Reports thickened discolored toenails of both feet.  84 y.o. female presents with the above complaint.  Past Medical History:  Diagnosis Date  . Alzheimer's disease   . High cholesterol   . Uterine cancer [facility])    Past Surgical History:  Procedure Laterality Date  . [facility]  . cataract surgery Bilateral     Current Outpatient Medications:  .  Cholecalciferol (VITAMIN D3) 2000 units TABS, Take 1 tablet by mouth daily., Disp: , Rfl:  .  Glucosamine-Chondroit-Vit C-Mn (GLUCOSAMINE CHONDR 1500 COMPLX PO), Take 1 tablet by mouth 2 (two) times daily., Disp: , Rfl:  .  memantine (NAMENDA XR) 28 MG CP24 24 hr capsule, TAKE 1 CAPSULE DAILY (APPOINTMENT DUE), Disp: 90 capsule, Rfl: 0 .  Multiple Vitamins-Minerals (MULTIVITAMIN WITH MINERALS) tablet, Take 1 tablet by mouth daily., Disp: , Rfl:  .  MYRBETRIQ 50 MG TB24 tablet, TAKE 1 TABLET DAILY, Disp: 90 tablet, Rfl: 1 .  rivastigmine (EXELON) 9.5 mg/24hr, Place 9.5 mg onto the skin daily., Disp: , Rfl:  .  simvastatin (ZOCOR) 20 MG tablet, TAKE 1 TABLET DAILY, Disp: 90 tablet, Rfl: 0  Allergies  Allergen Reactions  . Penicillins Hives  . Sulfa Antibiotics Hives     Objective:  There were no vitals filed for this visit. General AA&O x3. Normal mood and affect.  Vascular Pedal pulses palpable.  Neurologic Epicritic sensation grossly intact.  Dermatologic No open lesions. Skin normal texture and turgor. Toenails x 10 elongated, thickened, dystrophic.  Orthopedic: Pain to palpation about the toenails.   Assessment & Plan:  Patient was evaluated and treated and all questions answered.  Onychomycosis with pain  -Nails palliatively debrided as below. -Educated on  self-care  Procedure: Nail Debridement Rationale: pain  Type of Debridement: manual, sharp debridement. Instrumentation: Nail nipper, rotary burr. Number of Nails: 10   No follow-ups on file.

## 2018-06-07 ENCOUNTER — Telehealth: Payer: Self-pay | Admitting: *Deleted

## 2018-06-07 NOTE — Telephone Encounter (Signed)
I did not intend to check any labs because they were all normal at her last visit.

## 2018-06-07 NOTE — Telephone Encounter (Signed)
Patient husband, Iona Beard called and stated that patient has an appointment 6/19 at [facility] and he wants to know if she needs bloodwork prior to the appointment. Please Advise.

## 2018-06-07 NOTE — Telephone Encounter (Signed)
Patient notified

## 2018-06-12 ENCOUNTER — Other Ambulatory Visit: Payer: Self-pay | Admitting: Internal Medicine

## 2018-06-16 ENCOUNTER — Non-Acute Institutional Stay: Payer: Medicare Other | Admitting: Internal Medicine

## 2018-06-16 ENCOUNTER — Encounter: Payer: Self-pay | Admitting: Internal Medicine

## 2018-06-16 VITALS — BP 122/62 | HR 60 | Temp 98.7°F | Ht 60.0 in | Wt 125.0 lb

## 2018-06-16 DIAGNOSIS — F22 Delusional disorders: Secondary | ICD-10-CM

## 2018-06-16 DIAGNOSIS — F02818 Dementia in other diseases classified elsewhere, unspecified severity, with other behavioral disturbance: Secondary | ICD-10-CM | POA: Insufficient documentation

## 2018-06-16 DIAGNOSIS — E7800 Pure hypercholesterolemia, unspecified: Secondary | ICD-10-CM

## 2018-06-16 DIAGNOSIS — M150 Primary generalized (osteo)arthritis: Secondary | ICD-10-CM | POA: Diagnosis not present

## 2018-06-16 DIAGNOSIS — G301 Alzheimer's disease with late onset: Secondary | ICD-10-CM | POA: Insufficient documentation

## 2018-06-16 DIAGNOSIS — Z23 Encounter for immunization: Secondary | ICD-10-CM

## 2018-06-16 DIAGNOSIS — N3281 Overactive bladder: Secondary | ICD-10-CM

## 2018-06-16 DIAGNOSIS — F0281 Dementia in other diseases classified elsewhere with behavioral disturbance: Secondary | ICD-10-CM

## 2018-06-16 DIAGNOSIS — R4689 Other symptoms and signs involving appearance and behavior: Secondary | ICD-10-CM

## 2018-06-16 DIAGNOSIS — M8949 Other hypertrophic osteoarthropathy, multiple sites: Secondary | ICD-10-CM

## 2018-06-16 DIAGNOSIS — M159 Polyosteoarthritis, unspecified: Secondary | ICD-10-CM

## 2018-06-16 MED ORDER — SIMVASTATIN 20 MG PO TABS: 20.0000 mg | ORAL_TABLET | Freq: Every day | ORAL | 3 refills | Status: DC

## 2018-06-16 NOTE — Progress Notes (Signed)
Location:  Occupational psychologist of Service:  Clinic (12)  Provider: Osinachi Navarrette L. Mariea Clonts, D.O., C.M.D.  Goals of Care:  Advanced Directives 06/16/2018  Does Patient Have a Medical Advance Directive? Yes  Type of Paramedic of [place];Living will  Does patient want to make changes to medical advance directive? No - Patient declined  Copy of [place] in Chart? Yes     Chief Complaint  Patient presents with  . Medical Management of Chronic Issues    follow-up    HPI: Patient is a 84 y.o. female seen today for medical management of chronic diseases.    Simvastatin ran out and new Rx needed.    Overactive bladder is worse now.  Has been on myrbetriq from urologist in [place].  Worked for a couple of years.  She goes every 1.5 hrs.  This morning, she went right after getting here. She is already on 50mg .  Mr. Carlo tries to remind her to go before they leave somewhere to go.  She's not been incontinent.  She can hold it pretty well.    Weight stable over 4 mos, but trended up over time prior to that.    She has no concerns herself.  Memory problems are getting more frequent.  Mr. Auerbach is helping her more and more with getting dressed.  It used to be just choosing appropriate clothes--she's now putting things on backwards or inside out or struggle to put them on.  She does not put things away.  They will be on the bedroom floor or chairs folded neatly.  Not using the shelves, hangers or dressers.  Wants to help in the kitchen, but does not recall where to put them in the kitchen so things get lost.    She eats very small portions.  She snacks between meals.  Sometimes meal items disappear as snacks.    She did not like having anyone come into the home. He reports no problems with him taking care of her.  She does not use the stove or dangerous items she could leave on.  He's done cooking, but she keeps away from heat.   There has been some wandering problems.  There is now a bell on the door that rings back into the bedroom.  Prior to that she had left a couple of times and could not find her way back.  They were in the [place] [facility] once, but the bathroom had been move to the other side of the door and he showed her where it was, but then she walked off.  6-7 security people helped look for her.  She turned up outside of assisted living and one of the other residents found her and took her to [facility] office.  He now waits outside the bathroom for her.  She has gone outside twice since the bell was installed about 1.5 weeks ago, but she came right back.  Once locked herself out.    She also had an episode at 1230/1am.  Light pops on when he's sound asleep.  She'd gone out and she told security there was a stranger in her bed which was her husband.  She got security and they came to the room.  She recognized him with the light on.  One night last week, she did not know who he was, he told her and that was then end of it.  Their daughter called on father's day, spoke to her  and she did not know who it was afterwards.    Past Medical History:  Diagnosis Date  . Alzheimer's disease   . High cholesterol   . Uterine cancer [facility])     Past Surgical History:  Procedure Laterality Date  . [facility]  . cataract surgery Bilateral     Allergies  Allergen Reactions  . Penicillins Hives  . Sulfa Antibiotics Hives    Outpatient Encounter Medications as of 06/16/2018  Medication Sig  . Cholecalciferol (VITAMIN D3) 2000 units TABS Take 1 tablet by mouth daily.  . Glucosamine-Chondroit-Vit C-Mn (GLUCOSAMINE CHONDR 1500 COMPLX PO) Take 1 tablet by mouth 2 (two) times daily.  . memantine (NAMENDA XR) 28 MG CP24 24 hr capsule TAKE 1 CAPSULE DAILY (APPOINTMENT DUE)  . Multiple Vitamins-Minerals (MULTIVITAMIN WITH MINERALS) tablet Take 1 tablet by mouth daily.  Marland Kitchen MYRBETRIQ 50 MG TB24 tablet TAKE 1  TABLET DAILY  . rivastigmine (EXELON) 9.5 mg/24hr Place 9.5 mg onto the skin daily.  . simvastatin (ZOCOR) 20 MG tablet TAKE 1 TABLET DAILY   No facility-administered encounter medications on file as of 06/16/2018.     Review of Systems:  Review of Systems  Constitutional: Negative for chills, fever and malaise/fatigue.  HENT: Negative for congestion.   Eyes: Negative for blurred vision.  Respiratory: Negative for cough and shortness of breath.   Cardiovascular: Negative for chest pain, palpitations and leg swelling.  Gastrointestinal: Negative for abdominal pain, blood in stool, constipation and melena.  Genitourinary: Positive for frequency. Negative for dysuria, flank pain, hematuria and urgency.  Musculoskeletal: Negative for falls, joint pain and myalgias.  Neurological: Negative for dizziness, loss of consciousness and headaches.  Endo/Heme/Allergies: Bruises/bleeds easily.  Psychiatric/Behavioral: Positive for memory loss. Negative for depression and hallucinations. The patient does not have insomnia.        Delusions (see hpi)    Health Maintenance  Topic Date Due  . TETANUS/TDAP  12/05/1954  . PNA vac Low Risk Adult (2 of 2 - PCV13) 02/03/2013  . COLONOSCOPY  01/22/2017  . MAMMOGRAM  04/07/2017  . INFLUENZA VACCINE  07/29/2018  . DEXA SCAN  Completed    Physical Exam: Vitals:   06/16/18 0931  BP: 122/62  Pulse: 60  Temp: 98.7 F (37.1 C)  TempSrc: Oral  SpO2: 97%  Weight: 125 lb (56.7 kg)  Height: 5' (1.524 m)   Body mass index is 24.41 kg/m. Physical Exam  Constitutional: She appears well-developed and well-nourished. No distress.  HENT:  Head: Normocephalic and atraumatic.  Cardiovascular: Normal rate, regular rhythm, normal heart sounds and intact distal pulses.  Pulmonary/Chest: Effort normal and breath sounds normal. No respiratory distress.  Abdominal: Bowel sounds are normal.  Musculoskeletal: Normal range of motion.  Mild nonpitting edema of  bilateral ankles and feet, increased prominent varicosities  Neurological: She is alert.  Oriented to person and place now, but not always recognizing her husband  Skin: Skin is warm and dry.  Psychiatric: She has a normal mood and affect.   Due for labs  Assessment/Plan 1. Pure hypercholesterolemia - cont simvastatin, f/u flp - simvastatin (ZOCOR) 20 MG tablet; Take 1 tablet (20 mg total) by mouth daily.  Dispense: 90 tablet; Refill: 3  2. Primary osteoarthritis involving multiple joints -not bothersome at this time  3. OAB (overactive bladder) -worsening, discussed that alternatives to myrbetriq would potentially cause confusion and falls and she is already at risk for these -already on max dose myrbetriq  4. Dementia of the Alzheimer's type, with late onset, with delusions ([place]) -progressing significantly MMSE - Mini Mental State Exam 02/23/2018 08/24/2017 02/23/2017  Orientation to time 0 1 0  Orientation to Place 1 4 3   Registration 3 3 3   Attention/ Calculation 0 0 0  Recall 0 0 0  Language- name 2 objects 1 2 2   Language- repeat 1 1 1   Language- follow 3 step command 2 3 3   Language- read & follow direction 1 1 1   Write a sentence 1 1 1   Copy design 0 0 1  Total score 10 16 15   -she is now having delusions that he is not her husband and wandering--bell has had to be installed to keep Mr. Fawley aware if she tries to leave the premises -he is not ready to accept more help in the home or move Mrs. [place] at this time and wants to keep her at home--she did not like having help in the home in the past  5. Wandering behavior -see #4  6. Need for vaccination with 13-polyvalent pneumococcal conjugate vaccine -prevnar given today  Labs/tests ordered:  Cbc with diff, cmp, flp in am Next appt:  4 mos f/u memory  Aadvik Roker L. Caitlain Tweed, D.O. [facility] [address], [place] [address] Cell Phone (Mon-Fri 8am-5pm):   [phone] On Call:  [phone] & follow prompts after 5pm & weekends Office Phone:  [phone] Office Fax:  [phone]

## 2018-06-22 ENCOUNTER — Encounter: Payer: Self-pay | Admitting: Internal Medicine

## 2018-06-22 DIAGNOSIS — E785 Hyperlipidemia, unspecified: Secondary | ICD-10-CM | POA: Diagnosis not present

## 2018-06-22 DIAGNOSIS — N3281 Overactive bladder: Secondary | ICD-10-CM | POA: Diagnosis not present

## 2018-06-22 DIAGNOSIS — D649 Anemia, unspecified: Secondary | ICD-10-CM | POA: Diagnosis not present

## 2018-06-22 DIAGNOSIS — M150 Primary generalized (osteo)arthritis: Secondary | ICD-10-CM | POA: Diagnosis not present

## 2018-06-22 LAB — LIPID PANEL
Cholesterol: 143 (ref 0–200)
HDL: 56 (ref 35–70)
LDL Cholesterol: 72
Triglycerides: 74 (ref 40–160)

## 2018-06-22 LAB — CBC AND DIFFERENTIAL
HCT: 37 (ref 36–46)
Hemoglobin: 12.7 (ref 12.0–16.0)
Platelets: 192 (ref 150–399)
WBC: 4.9

## 2018-06-22 LAB — BASIC METABOLIC PANEL
BUN: 25 — AB (ref 4–21)
Creatinine: 0.7 (ref 0.5–1.1)
Glucose: 98
Potassium: 4.6 (ref 3.4–5.3)
Sodium: 144 (ref 137–147)

## 2018-06-22 LAB — HEPATIC FUNCTION PANEL
ALT: 14 (ref 7–35)
AST: 22 (ref 13–35)
Alkaline Phosphatase: 72 (ref 25–125)
Bilirubin, Total: 0.5

## 2018-07-06 ENCOUNTER — Non-Acute Institutional Stay: Payer: Medicare Other

## 2018-07-06 VITALS — BP 134/64 | HR 67 | Temp 97.7°F | Ht 60.0 in | Wt 125.0 lb

## 2018-07-06 DIAGNOSIS — Z0000 Encounter for general adult medical examination without abnormal findings: Secondary | ICD-10-CM

## 2018-07-06 NOTE — Patient Instructions (Signed)
Karen Mclean , Thank you for taking time to come for your Medicare Wellness Visit. I appreciate your ongoing commitment to your health goals. Please review the following plan we discussed and let me know if I can assist you in the future.   Screening recommendations/referrals: Colonoscopy excluded, over age 82 Mammogram excluded, over age 1 Bone Density up to date Recommended yearly ophthalmology/optometry visit for glaucoma screening and checkup Recommended yearly dental visit for hygiene and checkup  Vaccinations: Influenza vaccine up to date, due 2019 fall season Pneumococcal vaccine up to date, completed Tdap vaccine due, declined Shingles vaccine up to date, completed    Advanced directives: in chart  Conditions/risks identified: none  Next appointment: Dr. Mariea Clonts 10/13/2018 @ 9:30am   Preventive Care 90 Years and Older, Female Preventive care refers to lifestyle choices and visits with your health care provider that can promote health and wellness. What does preventive care include?  A yearly physical exam. This is also called an annual well check.  Dental exams once or twice a year.  Routine eye exams. Ask your health care provider how often you should have your eyes checked.  Personal lifestyle choices, including:  Daily care of your teeth and gums.  Regular physical activity.  Eating a healthy diet.  Avoiding tobacco and drug use.  Limiting alcohol use.  Practicing safe sex.  Taking low-dose aspirin every day.  Taking vitamin and mineral supplements as recommended by your health care provider. What happens during an annual well check? The services and screenings done by your health care provider during your annual well check will depend on your age, overall health, lifestyle risk factors, and family history of disease. Counseling  Your health care provider may ask you questions about your:  Alcohol use.  Tobacco use.  Drug use.  Emotional  well-being.  Home and relationship well-being.  Sexual activity.  Eating habits.  History of falls.  Memory and ability to understand (cognition).  Work and work Statistician.  Reproductive health. Screening  You may have the following tests or measurements:  Height, weight, and BMI.  Blood pressure.  Lipid and cholesterol levels. These may be checked every 5 years, or more frequently if you are over 79 years old.  Skin check.  Lung cancer screening. You may have this screening every year starting at age 60 if you have a 30-pack-year history of smoking and currently smoke or have quit within the past 15 years.  Fecal occult blood test (FOBT) of the stool. You may have this test every year starting at age 85.  Flexible sigmoidoscopy or colonoscopy. You may have a sigmoidoscopy every 5 years or a colonoscopy every 10 years starting at age 82.  Hepatitis C blood test.  Hepatitis B blood test.  Sexually transmitted disease (STD) testing.  Diabetes screening. This is done by checking your blood sugar (glucose) after you have not eaten for a while (fasting). You may have this done every 1-3 years.  Bone density scan. This is done to screen for osteoporosis. You may have this done starting at age 55.  Mammogram. This may be done every 1-2 years. Talk to your health care provider about how often you should have regular mammograms. Talk with your health care provider about your test results, treatment options, and if necessary, the need for more tests. Vaccines  Your health care provider may recommend certain vaccines, such as:  Influenza vaccine. This is recommended every year.  Tetanus, diphtheria, and acellular pertussis (Tdap, Td) vaccine.  You may need a Td booster every 10 years.  Zoster vaccine. You may need this after age 22.  Pneumococcal 13-valent conjugate (PCV13) vaccine. One dose is recommended after age 76.  Pneumococcal polysaccharide (PPSV23) vaccine. One  dose is recommended after age 69. Talk to your health care provider about which screenings and vaccines you need and how often you need them. This information is not intended to replace advice given to you by your health care provider. Make sure you discuss any questions you have with your health care provider. Document Released: 01/11/2016 Document Revised: 09/03/2016 Document Reviewed: 10/16/2015 Elsevier Interactive Patient Education  2017 [place] Prevention in the Home Falls can cause injuries. They can happen to people of all ages. There are many things you can do to make your home safe and to help prevent falls. What can I do on the outside of my home?  Regularly fix the edges of walkways and driveways and fix any cracks.  Remove anything that might make you trip as you walk through a door, such as a raised step or threshold.  Trim any bushes or trees on the path to your home.  Use bright outdoor lighting.  Clear any walking paths of anything that might make someone trip, such as rocks or tools.  Regularly check to see if handrails are loose or broken. Make sure that both sides of any steps have handrails.  Any raised decks and porches should have guardrails on the edges.  Have any leaves, snow, or ice cleared regularly.  Use sand or salt on walking paths during winter.  Clean up any spills in your garage right away. This includes oil or grease spills. What can I do in the bathroom?  Use night lights.  Install grab bars by the toilet and in the tub and shower. Do not use towel bars as grab bars.  Use non-skid mats or decals in the tub or shower.  If you need to sit down in the shower, use a plastic, non-slip stool.  Keep the floor dry. Clean up any water that spills on the floor as soon as it happens.  Remove soap buildup in the tub or shower regularly.  Attach bath mats securely with double-sided non-slip rug tape.  Do not have throw rugs and other  things on the floor that can make you trip. What can I do in the bedroom?  Use night lights.  Make sure that you have a light by your bed that is easy to reach.  Do not use any sheets or blankets that are too big for your bed. They should not hang down onto the floor.  Have a firm chair that has side arms. You can use this for support while you get dressed.  Do not have throw rugs and other things on the floor that can make you trip. What can I do in the kitchen?  Clean up any spills right away.  Avoid walking on wet floors.  Keep items that you use a lot in easy-to-reach places.  If you need to reach something above you, use a strong step stool that has a grab bar.  Keep electrical cords out of the way.  Do not use floor polish or wax that makes floors slippery. If you must use wax, use non-skid floor wax.  Do not have throw rugs and other things on the floor that can make you trip. What can I do with my stairs?  Do not leave any  items on the stairs.  Make sure that there are handrails on both sides of the stairs and use them. Fix handrails that are broken or loose. Make sure that handrails are as long as the stairways.  Check any carpeting to make sure that it is firmly attached to the stairs. Fix any carpet that is loose or worn.  Avoid having throw rugs at the top or bottom of the stairs. If you do have throw rugs, attach them to the floor with carpet tape.  Make sure that you have a light switch at the top of the stairs and the bottom of the stairs. If you do not have them, ask someone to add them for you. What else can I do to help prevent falls?  Wear shoes that:  Do not have high heels.  Have rubber bottoms.  Are comfortable and fit you well.  Are closed at the toe. Do not wear sandals.  If you use a stepladder:  Make sure that it is fully opened. Do not climb a closed stepladder.  Make sure that both sides of the stepladder are locked into place.  Ask  someone to hold it for you, if possible.  Clearly mark and make sure that you can see:  Any grab bars or handrails.  First and last steps.  Where the edge of each step is.  Use tools that help you move around (mobility aids) if they are needed. These include:  Canes.  Walkers.  Scooters.  Crutches.  Turn on the lights when you go into a dark area. Replace any light bulbs as soon as they burn out.  Set up your furniture so you have a clear path. Avoid moving your furniture around.  If any of your floors are uneven, fix them.  If there are any pets around you, be aware of where they are.  Review your medicines with your doctor. Some medicines can make you feel dizzy. This can increase your chance of falling. Ask your doctor what other things that you can do to help prevent falls. This information is not intended to replace advice given to you by your health care provider. Make sure you discuss any questions you have with your health care provider. Document Released: 10/11/2009 Document Revised: 05/22/2016 Document Reviewed: 01/19/2015 Elsevier Interactive Patient Education  2017 [employer].

## 2018-07-06 NOTE — Progress Notes (Signed)
Subjective:   Karen Mclean is a 84 y.o. female who presents for Medicare Annual (Subsequent) preventive examination at [facility]  Last AWV-06/30/2017    Objective:     Vitals: BP 134/64 (BP Location: Right Arm, Patient Position: Sitting)   Pulse 67   Temp 97.7 F (36.5 C) (Oral)   Ht 5' (1.524 m)   Wt 125 lb (56.7 kg)   SpO2 98%   BMI 24.41 kg/m   Body mass index is 24.41 kg/m.  Advanced Directives 07/06/2018 06/16/2018 06/30/2017 03/04/2017 01/07/2017  Does Patient Have a Medical Advance Directive? Yes Yes Yes Yes Yes  Type of Paramedic of [place];Living will [place];Living will [place];Living will [place];Living will [place];Living will  Does patient want to make changes to medical advance directive? - No - Patient declined No - Patient declined - -  Copy of [place] in Chart? Yes Yes No - copy requested Yes Yes    Tobacco Social History   Tobacco Use  Smoking Status Never Smoker  Smokeless Tobacco Former Engineer, structural given: Not Answered   Clinical Intake:  Pre-visit preparation completed: No  Pain : No/denies pain     Nutritional Risks: None Diabetes: No  How often do you need to have someone help you when you read instructions, pamphlets, or other written materials from your doctor or pharmacy?: 1 - Never What is the last grade level you completed in school?: College  Interpreter Needed?: No  Information entered by :: Tyson Dense, RN  Past Medical History:  Diagnosis Date  . Alzheimer's disease   . High cholesterol   . Uterine cancer [facility])    Past Surgical History:  Procedure Laterality Date  . [facility]  . cataract surgery Bilateral    Family History  Problem Relation Age of Onset  . Suicidality Brother   . Heart failure Brother   . Leukemia Sister     Social History   Socioeconomic History  . Marital status: Married    Spouse name: Not on file  . Number of children: 2  . Years of education: Not on file  . Highest education level: Not on file  Occupational History  . Occupation: Education officer, museum  Social Needs  . Financial resource strain: Not hard at all  . Food insecurity:    Worry: Never true    Inability: Never true  . Transportation needs:    Medical: No    Non-medical: No  Tobacco Use  . Smoking status: Never Smoker  . Smokeless tobacco: Former Network engineer and Sexual Activity  . Alcohol use: Yes    Alcohol/week: 1.2 - 1.8 oz    Types: 1 Glasses of wine, 1 - 2 Standard drinks or equivalent per week    Comment: 1/2 glass of wine a night  . Drug use: No  . Sexual activity: Not on file  Lifestyle  . Physical activity:    Days per week: 5 days    Minutes per session: 30 min  . Stress: Only a little  Relationships  . Social connections:    Talks on phone: More than three times a week    Gets together: More than three times a week    Attends religious service: More than 4 times per year    Active member of club or organization: No  Attends meetings of clubs or organizations: Never    Relationship status: Married  Other Topics Concern  . Not on file  Social History Narrative   Diet? No      Do you drink/eat things with caffeine?  No      Marital status?     Married                               What year were you married?  1960      Do you live in a house, apartment, assisted living, condo, trailer, etc.?  [place]      Is it one or more stories? One      How many persons live in your home? 2      Do you have any pets in your home? (please list) No      Current or past profession:  Retired Education officer, museum      Do you exercise?               yes                       Type & how often?  Walk daily      Do you have a living will? no      Do you have a DNR form?     no                              If not, do you want to discuss one? yes      Do you have signed POA/HPOA for forms? yes       Outpatient Encounter Medications as of 07/06/2018  Medication Sig  . Cholecalciferol (VITAMIN D3) 2000 units TABS Take 1 tablet by mouth daily.  . Glucosamine-Chondroit-Vit C-Mn (GLUCOSAMINE CHONDR 1500 COMPLX PO) Take 1 tablet by mouth 2 (two) times daily.  . memantine (NAMENDA XR) 28 MG CP24 24 hr capsule TAKE 1 CAPSULE DAILY (APPOINTMENT DUE)  . Multiple Vitamins-Minerals (MULTIVITAMIN WITH MINERALS) tablet Take 1 tablet by mouth daily.  Marland Kitchen MYRBETRIQ 50 MG TB24 tablet TAKE 1 TABLET DAILY  . rivastigmine (EXELON) 9.5 mg/24hr Place 9.5 mg onto the skin daily.  . simvastatin (ZOCOR) 20 MG tablet Take 1 tablet (20 mg total) by mouth daily.   No facility-administered encounter medications on file as of 07/06/2018.     Activities of Daily Living In your present state of health, do you have any difficulty performing the following activities: 07/06/2018  Hearing? N  Vision? N  Difficulty concentrating or making decisions? N  Walking or climbing stairs? N  Dressing or bathing? N  Doing errands, shopping? N  Preparing Food and eating ? N  Using the Toilet? N  In the past six months, have you accidently leaked urine? N  Comment frequency  Do you have problems with loss of bowel control? N  Managing your Medications? Y  Managing your Finances? Y  Housekeeping or managing your Housekeeping? Y  Some recent data might be hidden    Patient Care Team: Gayland Curry, DO as PCP - General (Geriatric Medicine) Addison Lank, MD as Consulting Physician (Dermatology)    Assessment:   This is a routine wellness examination for [place].  Exercise Activities and Dietary recommendations Current Exercise Habits: Structured exercise class, Type of exercise: Other - see comments(flex and flow,  chair fit classes), Time (Minutes): 30, Frequency (Times/Week): 5, Weekly Exercise (Minutes/Week): 150,  Intensity: Mild, Exercise limited by: None identified  Goals    None      Fall Risk Fall Risk  07/06/2018 06/16/2018 02/23/2018 02/23/2018 08/24/2017  Falls in the past year? No No No No No   Is the patient's home free of loose throw rugs in walkways, pet beds, electrical cords, etc?   yes      Grab bars in the bathroom? yes      Handrails on the stairs?   yes      Adequate lighting?   yes  Depression Screen PHQ 2/9 Scores 07/06/2018 06/16/2018 06/30/2017 03/04/2017  PHQ - 2 Score 0 0 0 0     Cognitive Function completed within last year MMSE - Mini Mental State Exam 02/23/2018 08/24/2017 02/23/2017 02/23/2017 01/07/2017  Orientation to time 0 1 0 0 0  Orientation to Place 1 4 3 3 1   Registration 3 3 3 3 3   Attention/ Calculation 0 0 0 0 4  Recall 0 0 0 0 3  Language- name 2 objects 1 2 2 2 2   Language- repeat 1 1 1 1 1   Language- follow 3 step command 2 3 3 3 3   Language- read & follow direction 1 1 1 1 1   Write a sentence 1 1 1 1 1   Copy design 0 0 1 1 0  Total score 10 16 15 15 19         Immunization History  Administered Date(s) Administered  . Influenza-Unspecified 10/31/2015, 10/23/2016, 10/19/2017  . Pneumococcal Conjugate-13 06/16/2018  . Pneumococcal Polysaccharide-23 02/04/2012  . Zoster Recombinat (Shingrix) 07/10/2017, 11/13/2017, 06/27/2018    Qualifies for Shingles Vaccine? Up to date, completed  Screening Tests Health Maintenance  Topic Date Due  . COLONOSCOPY  01/22/2017  . MAMMOGRAM  04/07/2017  . TETANUS/TDAP  12/30/2023 (Originally 12/05/1952)  . INFLUENZA VACCINE  07/29/2018  . DEXA SCAN  Completed  . PNA vac Low Risk Adult  Completed    Cancer Screenings: Lung: Low Dose CT Chest recommended if Age 14-80 years, 30 pack-year currently smoking OR have quit w/in 15years. Patient does not qualify. Breast:  Up to date on Mammogram? Yes   Up to date of Bone Density/Dexa? Yes Colorectal: up to date  Additional Screenings:  Hepatitis C Screening:  declined TDAP due: declined     Plan:    I have personally reviewed and addressed the Medicare Annual Wellness questionnaire and have noted the following in the patient's chart:  A. Medical and social history B. Use of alcohol, tobacco or illicit drugs  C. Current medications and supplements D. Functional ability and status E.  Nutritional status F.  Physical activity G. Advance directives H. List of other physicians I.  Hospitalizations, surgeries, and ER visits in previous 12 months J.  [place] to include hearing, vision, cognitive, depression L. Referrals and appointments - none  In addition, I have reviewed and discussed with patient certain preventive protocols, quality metrics, and best practice recommendations. A written personalized care plan for preventive services as well as general preventive health recommendations were provided to patient.  See attached scanned questionnaire for additional information.   Signed,   Tyson Dense, RN Nurse Health Advisor  Patient Concerns: none

## 2018-07-08 ENCOUNTER — Telehealth: Payer: Self-pay | Admitting: Neurology

## 2018-07-08 MED ORDER — RIVASTIGMINE 9.5 MG/24HR TD PT24: 9.5000 mg | MEDICATED_PATCH | Freq: Every day | TRANSDERMAL | 3 refills | Status: DC

## 2018-07-08 NOTE — Telephone Encounter (Signed)
Rx sent 

## 2018-07-08 NOTE — Telephone Encounter (Signed)
Patients husband states pt needs refill of medication [facility] can be called in at [phone].

## 2018-09-03 ENCOUNTER — Ambulatory Visit (INDEPENDENT_AMBULATORY_CARE_PROVIDER_SITE_OTHER): Payer: Medicare Other | Admitting: Neurology

## 2018-09-03 ENCOUNTER — Encounter: Payer: Self-pay | Admitting: Neurology

## 2018-09-03 VITALS — BP 110/60 | HR 72 | Ht 60.0 in | Wt 125.2 lb

## 2018-09-03 DIAGNOSIS — F0390 Unspecified dementia without behavioral disturbance: Secondary | ICD-10-CM

## 2018-09-03 DIAGNOSIS — F03B0 Unspecified dementia, moderate, without behavioral disturbance, psychotic disturbance, mood disturbance, and anxiety: Secondary | ICD-10-CM

## 2018-09-03 MED ORDER — RIVASTIGMINE 9.5 MG/24HR TD PT24: 9.5000 mg | MEDICATED_PATCH | Freq: Every day | TRANSDERMAL | 3 refills | Status: DC

## 2018-09-03 MED ORDER — MEMANTINE HCL ER 28 MG PO CP24: ORAL_CAPSULE | ORAL | 3 refills | Status: DC

## 2018-09-03 NOTE — Patient Instructions (Addendum)
Good seeing you! Continue all your medications. Follow-up in 6 months, call for any changes  FALL PRECAUTIONS: Be cautious when walking. Scan the area for obstacles that may increase the risk of trips and falls. When getting up in the mornings, sit up at the edge of the bed for a few minutes before getting out of bed. Consider elevating the bed at the head end to avoid drop of blood pressure when getting up. Walk always in a well-lit room (use night lights in the walls). Avoid area rugs or power cords from appliances in the middle of the walkways. Use a walker or a cane if necessary and consider physical therapy for balance exercise. Get your eyesight checked regularly.   HOME SAFETY: Consider the safety of the kitchen when operating appliances like stoves, microwave oven, and blender. Consider having supervision and share cooking responsibilities until no longer able to participate in those. Accidents with firearms and other hazards in the house should be identified and addressed as well.   ABILITY TO BE LEFT ALONE: If patient is unable to contact 911 operator, consider using LifeLine, or when the need is there, arrange for someone to stay with patients. Smoking is a fire hazard, consider supervision or cessation. Risk of wandering should be assessed by caregiver and if detected at any point, supervision and safe proof recommendations should be instituted.   RECOMMENDATIONS FOR ALL PATIENTS WITH MEMORY PROBLEMS: 1. Continue to exercise (Recommend 30 minutes of walking everyday, or 3 hours every week) 2. Increase social interactions - continue going to Church and enjoy social gatherings with friends and family 3. Eat healthy, avoid fried foods and eat more fruits and vegetables 4. Maintain adequate blood pressure, blood sugar, and blood cholesterol level. Reducing the risk of stroke and cardiovascular disease also helps promoting better memory. 5. Avoid stressful situations. Live a simple life and  avoid aggravations. Organize your time and prepare for the next day in anticipation. 6. Sleep well, avoid any interruptions of sleep and avoid any distractions in the bedroom that may interfere with adequate sleep quality 7. Avoid sugar, avoid sweets as there is a strong link between excessive sugar intake, diabetes, and cognitive impairment The Mediterranean diet has been shown to help patients reduce the risk of progressive memory disorders and reduces cardiovascular risk. This includes eating fish, eat fruits and green leafy vegetables, nuts like almonds and hazelnuts, walnuts, and also use olive oil. Avoid fast foods and fried foods as much as possible. Avoid sweets and sugar as sugar use has been linked to worsening of memory function.  There is always a concern of gradual progression of memory problems. If this is the case, then we may need to adjust level of care according to patient needs. Support, both to the patient and caregiver, should then be put into place.  

## 2018-09-03 NOTE — Progress Notes (Signed)
NEUROLOGY FOLLOW UP OFFICE NOTE  Karen Mclean [id number] 1933-03-14  HISTORY OF PRESENT ILLNESS: I had the pleasure of seeing Karen Mclean in follow-up in the neurology clinic on 09/03/2018.  The patient was last seen 6 months ago for moderate dementia without behavioral disturbance. She is again accompanied by her husband who helps supplement the history today.  MMSE in February 2019 was 10/30 (16/30 in August 2018, 15/30 in February). She is on the Exelon patch 9.5mg /day and Namenda XR 28mg  daily. Her husband brings a letter detailing changes from last visit. One night around 2 months ago, he woke up at 2:30am to find the patient and their retirement home security guard standing by the bed. She had gotten up and opened the door, he happened to be passing by and was told there was a stranger in her bed. After briefly talking to him, she realized who he was and seemed to recover. Since then, he installed an alarm each time the door opens. He can leave her for a period of 1-2 hours and she seems fine, he put a camera in the living room which he can view on his phone. She got lost one time when the usual bathroom was closed, staff had to go looking for her and found her outside. She is no longer capable of dressing herself, she would wear inappropriate clothes or put them on backward. She piles her clothes on table, chairs, or on the floor. Sometimes she goes through them for no apparent reason. Most of the time, she knows her husband, and in the evening gets anxious when they don't go to bed together. Occasionally she does not recognize him and asks when he is going to leave and go home. One time she got agitated but he calmed her down. She did not remember this the next morning. She cannot do much with meal preparation or clean up. She can transition to [place] and the social worker has told them of an opening, but her husband remains comfortable taking care of her currently. He only recalls 2  episodes of possible hallucinations, provoked by turning on/off the TV, she asked when that person was going to leave and to call security, another time asking where all the people went. She denies any headaches, dizziness, vision changes, focal symptoms, no falls. She exercises 5 days a week.   HPI 02/23/2017: This is a very pleasant 84 year old right-handed woman with a history of hyperlipidemia, uterine cancer, and Alzheimer's dementia, presenting to establish care. She had previously been living in [place], [place] and moved to [place] last August 2017 to be closer to her son. She and her husband live at a continuing care retirement home ([facility]). She thinks her memory is "pretty good." She then states that she lives in [place] and right now she is visiting with her husband who lives in [place]. She states "right now I am staying at your house." Her family started to notice memory changes in 2015, she was repeating herself. She was driving erratically and would miss a stop sign. She saw neurologist Dr. Eulis Manly who did several tests and was diagnosed with Alzheimer's disease. She has been on the Exelon patch since the Spring of 2016 with no side effects. She does not drive anymore. Her husband is in charge of bills. She states she is pretty good with remembering her medications, he husband shakes his head. She does not cook, they usually eat at the dining room  in the facility. One time she brought back iced tea and he told her to put it in the fridge. She walked to the bathroom then the bedroom looking for the fridge, then came back with the cup in her hand not knowing what to do with it. She does not recognize her husband 1-2 times a week, especially at night. A month or so ago, she got out of bed at midnight and did not know who he was. She wanted to sleep in the living room, then 30 minutes later he heard the doorbell and found that security came because she went to a neighbor and said there  was a stranger in the house. They are now in a more secure locked apartment with closer supervision. Sometimes she cannot recall her son's name and confuses him with her brother. She occasionally says she wants to go back to [place] and get a job. She occasionally thinks there is someone else in the room or talks about people coming to see them at night. Her husband does the laundry. She tries to help with the dishes but only rinses with lukewarm water and leaves them out. She can dress and bathe independently, but does not like to bathe. Her husband instead brings her to the jacuzzi three times a week. He denies any personality changes. Her MMSE with her PCP in January 2018 was 19/30, Namenda XR was added on to Exelon, which she is tolerating without side effects. No family history of dementia. She denies any significant head injuries. She drinks a small glass of wine with dinner every night. She is a Forensic psychologist and retired Education officer, museum.  PAST MEDICAL HISTORY: Past Medical History:  Diagnosis Date  . Alzheimer's disease   . High cholesterol   . Uterine cancer [facility])     MEDICATIONS: Current Outpatient Medications on File Prior to Visit  Medication Sig Dispense Refill  . Cholecalciferol (VITAMIN D3) 2000 units TABS Take 1 tablet by mouth daily.    . Glucosamine-Chondroit-Vit C-Mn (GLUCOSAMINE CHONDR 1500 COMPLX PO) Take 1 tablet by mouth 2 (two) times daily.    . memantine (NAMENDA XR) 28 MG CP24 24 hr capsule TAKE 1 CAPSULE DAILY (APPOINTMENT DUE) 90 capsule 0  . Multiple Vitamins-Minerals (MULTIVITAMIN WITH MINERALS) tablet Take 1 tablet by mouth daily.    Marland Kitchen MYRBETRIQ 50 MG TB24 tablet TAKE 1 TABLET DAILY 90 tablet 1  . rivastigmine (EXELON) 9.5 mg/24hr Place 1 patch (9.5 mg total) onto the skin daily. 90 patch 3  . simvastatin (ZOCOR) 20 MG tablet Take 1 tablet (20 mg total) by mouth daily. 90 tablet 3   No current facility-administered medications on file prior to visit.      ALLERGIES: Allergies  Allergen Reactions  . Penicillins Hives  . Sulfa Antibiotics Hives    FAMILY HISTORY: Family History  Problem Relation Age of Onset  . Suicidality Brother   . Heart failure Brother   . Leukemia Sister     SOCIAL HISTORY: Social History   Socioeconomic History  . Marital status: Married    Spouse name: Not on file  . Number of children: 2  . Years of education: Not on file  . Highest education level: Not on file  Occupational History  . Occupation: Education officer, museum  Social Needs  . Financial resource strain: Not hard at all  . Food insecurity:    Worry: Never true    Inability: Never true  . Transportation needs:    Medical: No  Non-medical: No  Tobacco Use  . Smoking status: Never Smoker  . Smokeless tobacco: Former Network engineer and Sexual Activity  . Alcohol use: Yes    Alcohol/week: 2.0 - 3.0 standard drinks    Types: 1 Glasses of wine, 1 - 2 Standard drinks or equivalent per week    Comment: 1/2 glass of wine a night  . Drug use: No  . Sexual activity: Not on file  Lifestyle  . Physical activity:    Days per week: 5 days    Minutes per session: 30 min  . Stress: Only a little  Relationships  . Social connections:    Talks on phone: More than three times a week    Gets together: More than three times a week    Attends religious service: More than 4 times per year    Active member of club or organization: No    Attends meetings of clubs or organizations: Never    Relationship status: Married  . Intimate partner violence:    Fear of current or ex partner: No    Emotionally abused: No    Physically abused: No    Forced sexual activity: No  Other Topics Concern  . Not on file  Social History Narrative   Diet? No      Do you drink/eat things with caffeine?  No      Marital status?     Married                               What year were you married?  1960      Do you live in a house, apartment, assisted living, condo,  trailer, etc.?  [place]      Is it one or more stories? One      How many persons live in your home? 2      Do you have any pets in your home? (please list) No      Current or past profession:  Retired Education officer, museum      Do you exercise?               yes                       Type & how often?  Walk daily      Do you have a living will? no      Do you have a DNR form?     no                             If not, do you want to discuss one? yes      Do you have signed POA/HPOA for forms? yes       REVIEW OF SYSTEMS: Constitutional: No fevers, chills, or sweats, no generalized fatigue, change in appetite Eyes: No visual changes, double vision, eye pain Ear, nose and throat: No hearing loss, ear pain, nasal congestion, sore throat Cardiovascular: No chest pain, palpitations Respiratory:  No shortness of breath at rest or with exertion, wheezes GastrointestinaI: No nausea, vomiting, diarrhea, abdominal pain, fecal incontinence Genitourinary:  No dysuria, urinary retention or frequency Musculoskeletal:  No neck pain, back pain Integumentary: No rash, pruritus, skin lesions Neurological: as above Psychiatric: No depression, insomnia, anxiety Endocrine: No palpitations, fatigue, diaphoresis, mood swings, change in appetite, change in weight, increased  thirst Hematologic/Lymphatic:  No anemia, purpura, petechiae. Allergic/Immunologic: no itchy/runny eyes, nasal congestion, recent allergic reactions, rashes  PHYSICAL EXAM: Vitals:   09/03/18 1531  BP: 110/60  Pulse: 72  SpO2: 96%   General: No acute distress Head:  Normocephalic/atraumatic Neck: supple, no paraspinal tenderness, full range of motion Heart:  Regular rate and rhythm Lungs:  Clear to auscultation bilaterally Back: No paraspinal tenderness Skin/Extremities: No rash, no edema Neurological Exam: alert and oriented to person, knows she is in a hospital. No aphasia or dysarthria. Fund of knowledge  is reduced.  Recent and remote memory are impaired.  Attention and concentration are normal.    Able to repeat phrases. Difficulty with naming "button" and "watch," described watch. Could name "pen." CDT 4/5 MMSE - Mini Mental State Exam 09/03/2018 02/23/2018 08/24/2017  Orientation to time 0 0 1  Orientation to Place 1 1 4   Registration 3 3 3   Attention/ Calculation 0 0 0  Recall 0 0 0  Language- name 2 objects 1 1 2   Language- repeat 1 1 1   Language- follow 3 step command 3 2 3   Language- read & follow direction 1 1 1   Write a sentence 1 1 1   Copy design 0 0 0  Total score 11 10 16    Cranial nerves: Pupils equal, round, reactive to light.  Extraocular movements intact with no nystagmus. Visual fields full. Facial sensation intact. No facial asymmetry. Tongue, uvula, palate midline.  Motor: Bulk and tone normal, muscle strength 5/5 throughout with no pronator drift.  Sensation to light touch intact.  No extinction to double simultaneous stimulation.  Deep tendon reflexes +1 throughout, toes downgoing.  Finger to nose testing intact.  Gait narrow-based and steady, mild difficulty with tandem walk but able.  Romberg negative.  IMPRESSION: This is a very pleasant 84 yo RH woman with hyperlipidemia, uterine cancer, and Alzheimer's disease. Her MMSE today is 111/30 (10/30 in February 2019, 16/30 in August 2018, 15/30 in February 2018). She is on the Exelon patch and Namenda XR 28mg  daily without side effects. No significant behavioral changes. We again discussed that if behavioral symptoms become an issue, her husband can call for medication to potentially help. Continue 24/7 supervision, there is an option for transition to Memory Care at their retirement facility. Continue control of vascular risk factors, physical exercise, and brain stimulation exercises for brain health. She will follow-up in 6 months and knows to call for any changes  Thank you for allowing me to participate in her care.  Please do  not hesitate to call for any questions or concerns.  The duration of this appointment visit was 30 minutes of face-to-face time with the patient.  Greater than 50% of this time was spent in counseling, explanation of diagnosis, planning of further management, and coordination of care.   Karen Mclean, M.D.   CC: Dr. Mariea Clonts

## 2018-10-13 ENCOUNTER — Encounter: Payer: Self-pay | Admitting: Internal Medicine

## 2018-10-13 ENCOUNTER — Non-Acute Institutional Stay: Payer: Medicare Other | Admitting: Internal Medicine

## 2018-10-13 VITALS — BP 112/62 | HR 59 | Temp 98.2°F | Ht 60.0 in | Wt 124.0 lb

## 2018-10-13 DIAGNOSIS — E7800 Pure hypercholesterolemia, unspecified: Secondary | ICD-10-CM | POA: Diagnosis not present

## 2018-10-13 DIAGNOSIS — M159 Polyosteoarthritis, unspecified: Secondary | ICD-10-CM

## 2018-10-13 DIAGNOSIS — G301 Alzheimer's disease with late onset: Secondary | ICD-10-CM

## 2018-10-13 DIAGNOSIS — F0281 Dementia in other diseases classified elsewhere with behavioral disturbance: Secondary | ICD-10-CM

## 2018-10-13 DIAGNOSIS — Z23 Encounter for immunization: Secondary | ICD-10-CM | POA: Diagnosis not present

## 2018-10-13 DIAGNOSIS — F22 Delusional disorders: Secondary | ICD-10-CM

## 2018-10-13 DIAGNOSIS — N3281 Overactive bladder: Secondary | ICD-10-CM | POA: Diagnosis not present

## 2018-10-13 DIAGNOSIS — M8949 Other hypertrophic osteoarthropathy, multiple sites: Secondary | ICD-10-CM

## 2018-10-13 DIAGNOSIS — M150 Primary generalized (osteo)arthritis: Secondary | ICD-10-CM | POA: Diagnosis not present

## 2018-10-13 DIAGNOSIS — M8589 Other specified disorders of bone density and structure, multiple sites: Secondary | ICD-10-CM | POA: Diagnosis not present

## 2018-10-13 DIAGNOSIS — F02818 Dementia in other diseases classified elsewhere, unspecified severity, with other behavioral disturbance: Secondary | ICD-10-CM

## 2018-10-13 MED ORDER — TETANUS-DIPHTH-ACELL PERTUSSIS 5-2-15.5 LF-MCG/0.5 IM SUSP: 0.5000 mL | Freq: Once | INTRAMUSCULAR | 0 refills | Status: AC

## 2018-10-13 NOTE — Progress Notes (Signed)
Location:  Occupational psychologist of Service:  Clinic (12)  Provider: Rufino Staup L. Mariea Clonts, D.O., C.M.D.  Goals of Care:  Advanced Directives 07/06/2018  Does Patient Have a Medical Advance Directive? Yes  Type of Paramedic of [place];Living will  Does patient want to make changes to medical advance directive? -  Copy of [place] in Chart? Yes     Chief Complaint  Patient presents with  . Medical Management of Chronic Issues    36mth follow-up    HPI: Patient is a 84 y.o. female seen today for medical management of chronic diseases.    Did have nails polished a couple mos ago.  Right fifth digit with bright pink beneath her nail.  No swelling or pain.  She needs more help with everyday living.  He's even helping her to get dressed now.  She accepts his help.  She has spells at times where she does not know her husband.  Did not know who their son was after they went to the zoo a few weeks ago.  She's got a new ID bracelet.  It went missing one day and it was in the pocket of some pants.  No recent wandering off spells with an alarm that wakes him if she tries to walk off.  He has to wait outside the restroom if they go to [employer].   They've had a couple of attempts of having home care which she didn't like too much, but he says it would be nice to have additional help.   He has a camera if he leaves for an hour to see how she's doing.  She can answer the landline, but cannot make calls.   She will watch what her husband does.  Not reading much anymore, mostly just looks at pictures.    No falls.  Otherwise health good.  Goes to exercise class about daily.  Appetite remains good.  Sometimes sneaks snacks just before dinner then challenging to get her to go eat.  Eats small meals but adequate.  No difficulty sleeping--ready to go early sometimes. He'll be watching tv and she keeps checking if it's time yet to go to sleep.  She  sleeps through tv herself a lot.  Does urinate frequently, but no incontinence.  He has to ask ahead of plans to leave to make sure she urinates before they leave.  Goes about hourly.  Gets up 1-2 times only at night.    Seeing well as best we know.  Has f/u with Dr. Ellie Lunch in a couple of months, had prior cataract surgery. Wears readers.  They are scheduled for flu shots on 10/25.  Discussed tdap vaccine.    Past Medical History:  Diagnosis Date  . Alzheimer's disease ([place])   . High cholesterol   . Uterine cancer [facility])     Past Surgical History:  Procedure Laterality Date  . [facility]  . cataract surgery Bilateral     Allergies  Allergen Reactions  . Penicillins Hives  . Sulfa Antibiotics Hives    Outpatient Encounter Medications as of 10/13/2018  Medication Sig  . Cholecalciferol (VITAMIN D3) 2000 units TABS Take 1 tablet by mouth daily.  . Glucosamine-Chondroit-Vit C-Mn (GLUCOSAMINE CHONDR 1500 COMPLX PO) Take 1 tablet by mouth 2 (two) times daily.  . Melatonin 1 MG TABS Take by mouth at bedtime.  . memantine (NAMENDA XR) 28 MG CP24 24  hr capsule Take 1 tablet daily  . Multiple Vitamins-Minerals (MULTIVITAMIN WITH MINERALS) tablet Take 1 tablet by mouth daily.  Marland Kitchen MYRBETRIQ 50 MG TB24 tablet TAKE 1 TABLET DAILY  . rivastigmine (EXELON) 9.5 mg/24hr Place 1 patch (9.5 mg total) onto the skin daily.  . simvastatin (ZOCOR) 20 MG tablet Take 1 tablet (20 mg total) by mouth daily.   No facility-administered encounter medications on file as of 10/13/2018.     Review of Systems:  Review of Systems  Constitutional: Negative for chills, fever and malaise/fatigue.  HENT: Negative for congestion and hearing loss.   Eyes: Negative for blurred vision.       Reading glasses  Respiratory: Negative for cough and shortness of breath.   Cardiovascular: Negative for chest pain, palpitations and leg swelling.  Gastrointestinal: Negative for abdominal  pain, blood in stool, constipation, diarrhea and melena.  Genitourinary: Positive for frequency. Negative for dysuria and urgency.  Musculoskeletal: Negative for falls.  Skin: Negative for itching and rash.  Neurological: Negative for dizziness.  Psychiatric/Behavioral: Positive for memory loss. Negative for depression. The patient is not nervous/anxious and does not have insomnia.     Health Maintenance  Topic Date Due  . COLONOSCOPY  01/22/2017  . MAMMOGRAM  04/07/2017  . INFLUENZA VACCINE  07/29/2018  . TETANUS/TDAP  12/30/2023 (Originally 12/05/1952)  . DEXA SCAN  Completed  . PNA vac Low Risk Adult  Completed    Physical Exam: Vitals:   10/13/18 0932  BP: 112/62  Pulse: (!) 59  Temp: 98.2 F (36.8 C)  TempSrc: Oral  SpO2: 97%  Weight: 124 lb (56.2 kg)  Height: 5' (1.524 m)   Body mass index is 24.22 kg/m. Physical Exam  Constitutional: She appears well-developed and well-nourished. No distress.  Cardiovascular: Normal rate, regular rhythm, normal heart sounds and intact distal pulses.  Pulmonary/Chest: Effort normal and breath sounds normal. No respiratory distress.  Abdominal: Bowel sounds are normal.  Musculoskeletal: Normal range of motion.  Neurological: She is alert.  Skin: Skin is warm and dry.  Psychiatric: She has a normal mood and affect.    Labs reviewed: Basic Metabolic Panel: Recent Labs    06/22/18  NA 144  K 4.6  BUN 25*  CREATININE 0.7   Liver Function Tests: Recent Labs    06/22/18 0600  AST 22  ALT 14  ALKPHOS 72   No results for input(s): LIPASE, AMYLASE in the last 8760 hours. No results for input(s): AMMONIA in the last 8760 hours. CBC: Recent Labs    06/22/18  WBC 4.9  HGB 12.7  HCT 37  PLT 192   Lipid Panel: Recent Labs    06/22/18  CHOL 143  HDL 56  LDLCALC 72  TRIG 74   Assessment/Plan 1. OAB (overactive bladder) -continue myrbetriq -has frequency in days, but not incontinent  2. Dementia of the Alzheimer's  type, with late onset, with delusions ([place]) -progressing, but continuing without regular home care at this point -Mr Luff is open to trying out the memory care center for his wife and also to some regular home care hours for her--he'd like to have some consistency to the caregiver for his wife -cont namenda XR and exelon   3. Primary osteoarthritis involving multiple joints -no complaints right now  4. Pure hypercholesterolemia -on simvastatin, last LDL good  5. Osteopenia of multiple sites -ongoing, cont vitamin D therapy and weightbearing exercise classes  6. Need for Tdap vaccination - sent rx to [facility] - Tdap (  ADACEL) 04-29-14.5 LF-MCG/0.5 injection; Inject 0.5 mLs into the muscle once for 1 dose.  Dispense: 0.5 mL; Refill: 0  Labs/tests ordered:  Cbc, cmp, flp before Next appt:  6 mos med mgt  Zaylen Susman L. Tomie Elko, D.O. [facility] [address], [place] [address] Cell Phone (Mon-Fri 8am-5pm):  [phone] On Call:  [phone] & follow prompts after 5pm & weekends Office Phone:  [phone] Office Fax:  [phone]

## 2018-10-22 DIAGNOSIS — Z23 Encounter for immunization: Secondary | ICD-10-CM | POA: Diagnosis not present

## 2018-11-24 ENCOUNTER — Other Ambulatory Visit: Payer: Self-pay | Admitting: Internal Medicine

## 2019-01-31 DIAGNOSIS — Z961 Presence of intraocular lens: Secondary | ICD-10-CM | POA: Diagnosis not present

## 2019-01-31 DIAGNOSIS — H524 Presbyopia: Secondary | ICD-10-CM | POA: Diagnosis not present

## 2019-02-13 ENCOUNTER — Encounter (HOSPITAL_COMMUNITY): Payer: Self-pay

## 2019-02-13 ENCOUNTER — Other Ambulatory Visit: Payer: Self-pay

## 2019-02-13 ENCOUNTER — Emergency Department (HOSPITAL_COMMUNITY): Payer: Medicare Other

## 2019-02-13 ENCOUNTER — Emergency Department (HOSPITAL_COMMUNITY)
Admission: EM | Admit: 2019-02-13 | Discharge: 2019-02-13 | Disposition: A | Discharge status: Home / Self Care | Payer: Medicare Other | Attending: Emergency Medicine | Admitting: Emergency Medicine

## 2019-02-13 DIAGNOSIS — S199XXA Unspecified injury of neck, initial encounter: Secondary | ICD-10-CM | POA: Diagnosis not present

## 2019-02-13 DIAGNOSIS — S0081XA Abrasion of other part of head, initial encounter: Secondary | ICD-10-CM | POA: Diagnosis not present

## 2019-02-13 DIAGNOSIS — E7800 Pure hypercholesterolemia, unspecified: Secondary | ICD-10-CM | POA: Diagnosis not present

## 2019-02-13 DIAGNOSIS — Y939 Activity, unspecified: Secondary | ICD-10-CM | POA: Insufficient documentation

## 2019-02-13 DIAGNOSIS — W101XXA Fall (on)(from) sidewalk curb, initial encounter: Secondary | ICD-10-CM | POA: Insufficient documentation

## 2019-02-13 DIAGNOSIS — Z79899 Other long term (current) drug therapy: Secondary | ICD-10-CM | POA: Diagnosis not present

## 2019-02-13 DIAGNOSIS — Y92480 Sidewalk as the place of occurrence of the external cause: Secondary | ICD-10-CM | POA: Diagnosis not present

## 2019-02-13 DIAGNOSIS — S6992XA Unspecified injury of left wrist, hand and finger(s), initial encounter: Secondary | ICD-10-CM | POA: Diagnosis not present

## 2019-02-13 DIAGNOSIS — R5381 Other malaise: Secondary | ICD-10-CM | POA: Diagnosis not present

## 2019-02-13 DIAGNOSIS — M25531 Pain in right wrist: Secondary | ICD-10-CM | POA: Diagnosis not present

## 2019-02-13 DIAGNOSIS — S6991XA Unspecified injury of right wrist, hand and finger(s), initial encounter: Secondary | ICD-10-CM | POA: Diagnosis not present

## 2019-02-13 DIAGNOSIS — R079 Chest pain, unspecified: Secondary | ICD-10-CM | POA: Diagnosis not present

## 2019-02-13 DIAGNOSIS — M25532 Pain in left wrist: Secondary | ICD-10-CM | POA: Diagnosis not present

## 2019-02-13 DIAGNOSIS — W19XXXA Unspecified fall, initial encounter: Secondary | ICD-10-CM | POA: Diagnosis not present

## 2019-02-13 DIAGNOSIS — S0990XA Unspecified injury of head, initial encounter: Secondary | ICD-10-CM | POA: Diagnosis not present

## 2019-02-13 DIAGNOSIS — E785 Hyperlipidemia, unspecified: Secondary | ICD-10-CM | POA: Diagnosis not present

## 2019-02-13 DIAGNOSIS — Y998 Other external cause status: Secondary | ICD-10-CM | POA: Diagnosis not present

## 2019-02-13 DIAGNOSIS — G309 Alzheimer's disease, unspecified: Secondary | ICD-10-CM | POA: Diagnosis not present

## 2019-02-13 DIAGNOSIS — S0993XA Unspecified injury of face, initial encounter: Secondary | ICD-10-CM | POA: Diagnosis not present

## 2019-02-13 DIAGNOSIS — M79642 Pain in left hand: Secondary | ICD-10-CM | POA: Diagnosis not present

## 2019-02-13 DIAGNOSIS — S79912A Unspecified injury of left hip, initial encounter: Secondary | ICD-10-CM | POA: Diagnosis not present

## 2019-02-13 DIAGNOSIS — M25552 Pain in left hip: Secondary | ICD-10-CM | POA: Diagnosis not present

## 2019-02-13 DIAGNOSIS — S299XXA Unspecified injury of thorax, initial encounter: Secondary | ICD-10-CM | POA: Diagnosis not present

## 2019-02-13 DIAGNOSIS — M79641 Pain in right hand: Secondary | ICD-10-CM | POA: Diagnosis not present

## 2019-02-13 NOTE — ED Notes (Signed)
Abrasions to nose, lip, face, both hands cleansed and bandaged.  Pt assisted to use bedside commode without difficulty.

## 2019-02-13 NOTE — ED Notes (Signed)
Patient transported to CT 

## 2019-02-13 NOTE — ED Provider Notes (Signed)
[facility] Provider Note   CSN: [id number] Arrival date & time: 02/13/19  4098     History   Chief Complaint Chief Complaint  Patient presents with  . Fall    HPI GARRETT BOWRING is a 85 y.o. female.  The history is provided by the patient and a relative.  Fall  This is a new problem. The current episode started less than 1 hour ago. The problem occurs rarely. The problem has not changed since onset.Pertinent negatives include no chest pain, no abdominal pain, no headaches and no shortness of breath. Nothing aggravates the symptoms. She has tried nothing for the symptoms. The treatment provided no relief.    Past Medical History:  Diagnosis Date  . Alzheimer's disease ([place])   . High cholesterol   . Uterine cancer [facility])     Patient Active Problem List   Diagnosis Date Noted  . Dementia of the Alzheimer's type, with late onset, with delusions ([place]) 06/16/2018  . Alzheimer's disease ([place]) 08/24/2016  . History of uterine cancer 08/24/2016  . Hyperlipidemia 08/24/2016  . OAB (overactive bladder) 08/24/2016  . Osteopenia 08/24/2016  . Primary osteoarthritis involving multiple joints 08/24/2016    Past Surgical History:  Procedure Laterality Date  . [facility]  . cataract surgery Bilateral      OB History   No obstetric history on file.      Home Medications    Prior to Admission medications   Medication Sig Start Date End Date Taking? Authorizing Provider  Cholecalciferol (VITAMIN D3) 2000 units TABS Take 1 tablet by mouth daily.    [provider]  Glucosamine-Chondroit-Vit C-Mn (GLUCOSAMINE CHONDR 1500 COMPLX PO) Take 1 tablet by mouth 2 (two) times daily.    [provider]  Melatonin 1 MG TABS Take by mouth at bedtime.    [provider]  memantine (NAMENDA XR) 28 MG CP24 24 hr capsule Take 1 tablet daily 09/03/18   Cameron Sprang, MD  Multiple  Vitamins-Minerals (MULTIVITAMIN WITH MINERALS) tablet Take 1 tablet by mouth daily.    [provider]  MYRBETRIQ 50 MG TB24 tablet TAKE 1 TABLET DAILY 11/24/18   Reed, Tiffany L, DO  rivastigmine (EXELON) 9.5 mg/24hr Place 1 patch (9.5 mg total) onto the skin daily. 09/03/18   Cameron Sprang, MD  simvastatin (ZOCOR) 20 MG tablet Take 1 tablet (20 mg total) by mouth daily. 06/16/18   Gayland Curry, DO    Family History Family History  Problem Relation Age of Onset  . Suicidality Brother   . Heart failure Brother   . Leukemia Sister     Social History Social History   Tobacco Use  . Smoking status: Never Smoker  . Smokeless tobacco: Former Network engineer Use Topics  . Alcohol use: Yes    Alcohol/week: 2.0 - 3.0 standard drinks    Types: 1 Glasses of wine, 1 - 2 Standard drinks or equivalent per week    Comment: 1/2 glass of wine a night  . Drug use: No     Allergies   Penicillins and Sulfa antibiotics   Review of Systems Review of Systems  Constitutional: Negative for chills and fever.  HENT: Negative for ear pain and sore throat.   Eyes: Negative for pain and visual disturbance.  Respiratory: Negative for cough and shortness of breath.   Cardiovascular: Negative for chest pain and palpitations.  Gastrointestinal: Negative for abdominal pain  and vomiting.  Genitourinary: Negative for dysuria and hematuria.  Musculoskeletal: Negative for arthralgias and back pain.  Skin: Positive for wound. Negative for color change and rash.  Neurological: Negative for dizziness, tremors, seizures, syncope, facial asymmetry, speech difficulty, weakness, light-headedness, numbness and headaches.  All other systems reviewed and are negative.    Physical Exam Updated Vital Signs  ED Triage Vitals  Enc Vitals Group     BP 02/13/19 0932 (!) 167/82     Pulse Rate 02/13/19 0932 68     Resp 02/13/19 0932 16     Temp 02/13/19 0932 98 F (36.7 C)     Temp Source 02/13/19 0932  Oral     SpO2 02/13/19 0932 100 %     Weight --      Height --      Head Circumference --      Peak Flow --      Pain Score 02/13/19 0933 0     Pain Loc --      Pain Edu? --      Excl. in [place]? --     Physical Exam Vitals signs and nursing note reviewed.  Constitutional:      General: She is not in acute distress.    Appearance: She is well-developed. She is not ill-appearing.  HENT:     Head:     Comments: Abrasion over nose, philtrum area with small puncture wound just above the lip that is pinpoint and hemostatic, inner upper lip abrasion/minor laceration that is also hemostatic, teeth are intact patient is able to open and close her mouth without any pain no nasal septal hematoma    Nose: Nose normal.     Mouth/Throat:     Mouth: Mucous membranes are moist.  Eyes:     Extraocular Movements: Extraocular movements intact.     Conjunctiva/sclera: Conjunctivae normal.     Pupils: Pupils are equal, round, and reactive to light.  Neck:     Musculoskeletal: Neck supple. No muscular tenderness.     Comments: In cervical collar Cardiovascular:     Rate and Rhythm: Normal rate and regular rhythm.     Heart sounds: No murmur.  Pulmonary:     Effort: Pulmonary effort is normal. No respiratory distress.     Breath sounds: Normal breath sounds.  Abdominal:     Palpations: Abdomen is soft.     Tenderness: There is no abdominal tenderness.  Musculoskeletal: Normal range of motion.        General: No swelling, tenderness or deformity.  Skin:    General: Skin is warm and dry.     Capillary Refill: Capillary refill takes less than 2 seconds.     Findings: No rash.     Comments: Abrasions over the palm of bilateral hands, hemostatic  Neurological:     General: No focal deficit present.     Mental Status: She is alert.     Cranial Nerves: No cranial nerve deficit.     Sensory: No sensory deficit.     Motor: No weakness.     Coordination: Coordination normal.     Comments: 5+ out of  5 strength throughout, normal sensation, no drift      ED Treatments / Results  Labs (all labs ordered are listed, but only abnormal results are displayed) Labs Reviewed - No data to display  EKG None  Radiology Dg Chest 1 View  Result Date: 02/13/2019 CLINICAL DATA:  Pain after fall EXAM: CHEST  1 VIEW COMPARISON:  None. FINDINGS: The heart size and mediastinal contours are within normal limits. Both lungs are clear. The visualized skeletal structures are unremarkable. IMPRESSION: No active disease. Electronically Signed   By: Dorise Bullion III M.D   On: 02/13/2019 11:55   Dg Wrist Complete Left  Result Date: 02/13/2019 CLINICAL DATA:  Fall.  Pain. EXAM: LEFT WRIST - COMPLETE 3+ VIEW COMPARISON:  None. FINDINGS: There is a mildly unusual configuration of the scaphoid but the configuration is similar to the right. No convincing evidence of scaphoid fracture. No other fractures identified. IMPRESSION: There is a mildly unusual configuration to the scaphoid which is similar on the left to the right and probably normal for this patient. Recommend clinical correlation to exclude snuffbox tenderness. No convincing evidence of fracture on this study. Electronically Signed   By: Dorise Bullion III M.D   On: 02/13/2019 11:47   Dg Wrist Complete Right  Result Date: 02/13/2019 CLINICAL DATA:  Pain after fall EXAM: RIGHT WRIST - COMPLETE 3+ VIEW COMPARISON:  None. FINDINGS: There is no evidence of fracture or dislocation. There is no evidence of arthropathy or other focal bone abnormality. Soft tissues are unremarkable. IMPRESSION: Negative. Electronically Signed   By: Dorise Bullion III M.D   On: 02/13/2019 11:52   Ct Head Wo Contrast  Result Date: 02/13/2019 CLINICAL DATA:  [place] to the sidewalk. Trauma to the head, face and neck. EXAM: CT HEAD WITHOUT CONTRAST CT MAXILLOFACIAL WITHOUT CONTRAST CT CERVICAL SPINE WITHOUT CONTRAST TECHNIQUE: Multidetector CT imaging of the head, cervical spine,  and maxillofacial structures were performed using the standard protocol without intravenous contrast. Multiplanar CT image reconstructions of the cervical spine and maxillofacial structures were also generated. COMPARISON:  None. FINDINGS: CT HEAD FINDINGS Brain: Age related atrophy. Chronic small-vessel ischemic changes throughout the white matter. No sign of acute infarction, mass lesion, hemorrhage, hydrocephalus or extra-axial collection. Vascular: There is atherosclerotic calcification of the major vessels at the base of the brain. Skull: Negative Other: None CT MAXILLOFACIAL FINDINGS Osseous: No evidence of facial fracture. Orbits: No evidence of orbital injury. Sinuses: Clear Soft tissues: No significant soft tissue finding by CT. CT CERVICAL SPINE FINDINGS Alignment: Normal Skull base and vertebrae: No fracture. Superior endplate Schmorl's nodes at C7. Soft tissues and spinal canal: Negative Disc levels: Ordinary spondylosis and facet osteoarthritis. No apparent bony canal stenosis or significant foraminal narrowing. Upper chest: Mild scarring.  No active process. Other: None IMPRESSION: Head CT: No acute or traumatic finding. Atrophy and chronic small-vessel ischemic changes. Maxillofacial CT: No acute or traumatic finding. Cervical spine CT: No acute or traumatic finding. Ordinary mild degenerative changes fairly typical for age. Electronically Signed   By: Nelson Chimes M.D.   On: 02/13/2019 11:21   Ct Cervical Spine Wo Contrast  Result Date: 02/13/2019 CLINICAL DATA:  [place] to the sidewalk. Trauma to the head, face and neck. EXAM: CT HEAD WITHOUT CONTRAST CT MAXILLOFACIAL WITHOUT CONTRAST CT CERVICAL SPINE WITHOUT CONTRAST TECHNIQUE: Multidetector CT imaging of the head, cervical spine, and maxillofacial structures were performed using the standard protocol without intravenous contrast. Multiplanar CT image reconstructions of the cervical spine and maxillofacial structures were also generated.  COMPARISON:  None. FINDINGS: CT HEAD FINDINGS Brain: Age related atrophy. Chronic small-vessel ischemic changes throughout the white matter. No sign of acute infarction, mass lesion, hemorrhage, hydrocephalus or extra-axial collection. Vascular: There is atherosclerotic calcification of the major vessels at the base of the brain. Skull: Negative Other: None CT MAXILLOFACIAL FINDINGS  Osseous: No evidence of facial fracture. Orbits: No evidence of orbital injury. Sinuses: Clear Soft tissues: No significant soft tissue finding by CT. CT CERVICAL SPINE FINDINGS Alignment: Normal Skull base and vertebrae: No fracture. Superior endplate Schmorl's nodes at C7. Soft tissues and spinal canal: Negative Disc levels: Ordinary spondylosis and facet osteoarthritis. No apparent bony canal stenosis or significant foraminal narrowing. Upper chest: Mild scarring.  No active process. Other: None IMPRESSION: Head CT: No acute or traumatic finding. Atrophy and chronic small-vessel ischemic changes. Maxillofacial CT: No acute or traumatic finding. Cervical spine CT: No acute or traumatic finding. Ordinary mild degenerative changes fairly typical for age. Electronically Signed   By: Nelson Chimes M.D.   On: 02/13/2019 11:21   Dg Hand Complete Left  Result Date: 02/13/2019 CLINICAL DATA:  Pain after fall EXAM: LEFT HAND - COMPLETE 3+ VIEW COMPARISON:  None. FINDINGS: There is no evidence of fracture or dislocation. There is no evidence of arthropathy or other focal bone abnormality. Soft tissues are unremarkable. IMPRESSION: Negative. Electronically Signed   By: Dorise Bullion III M.D   On: 02/13/2019 11:54   Dg Hand Complete Right  Result Date: 02/13/2019 CLINICAL DATA:  Pain after fall EXAM: RIGHT HAND - COMPLETE 3+ VIEW COMPARISON:  None. FINDINGS: Degenerative changes primarily in the thumb. No fracture dislocation. IMPRESSION: Degenerative changes in the thumb.  No fracture or dislocation. Electronically Signed   By: Dorise Bullion III M.D   On: 02/13/2019 11:54   Dg Hip Unilat With Pelvis 2-3 Views Left  Result Date: 02/13/2019 CLINICAL DATA:  Pain after fall EXAM: DG HIP (WITH OR WITHOUT PELVIS) 2-3V LEFT COMPARISON:  None. FINDINGS: There is no evidence of hip fracture or dislocation. There is no evidence of arthropathy or other focal bone abnormality. IMPRESSION: Negative. Electronically Signed   By: Dorise Bullion III M.D   On: 02/13/2019 11:53   Ct Maxillofacial Wo Contrast  Result Date: 02/13/2019 CLINICAL DATA:  [place] to the sidewalk. Trauma to the head, face and neck. EXAM: CT HEAD WITHOUT CONTRAST CT MAXILLOFACIAL WITHOUT CONTRAST CT CERVICAL SPINE WITHOUT CONTRAST TECHNIQUE: Multidetector CT imaging of the head, cervical spine, and maxillofacial structures were performed using the standard protocol without intravenous contrast. Multiplanar CT image reconstructions of the cervical spine and maxillofacial structures were also generated. COMPARISON:  None. FINDINGS: CT HEAD FINDINGS Brain: Age related atrophy. Chronic small-vessel ischemic changes throughout the white matter. No sign of acute infarction, mass lesion, hemorrhage, hydrocephalus or extra-axial collection. Vascular: There is atherosclerotic calcification of the major vessels at the base of the brain. Skull: Negative Other: None CT MAXILLOFACIAL FINDINGS Osseous: No evidence of facial fracture. Orbits: No evidence of orbital injury. Sinuses: Clear Soft tissues: No significant soft tissue finding by CT. CT CERVICAL SPINE FINDINGS Alignment: Normal Skull base and vertebrae: No fracture. Superior endplate Schmorl's nodes at C7. Soft tissues and spinal canal: Negative Disc levels: Ordinary spondylosis and facet osteoarthritis. No apparent bony canal stenosis or significant foraminal narrowing. Upper chest: Mild scarring.  No active process. Other: None IMPRESSION: Head CT: No acute or traumatic finding. Atrophy and chronic small-vessel ischemic changes.  Maxillofacial CT: No acute or traumatic finding. Cervical spine CT: No acute or traumatic finding. Ordinary mild degenerative changes fairly typical for age. Electronically Signed   By: Nelson Chimes M.D.   On: 02/13/2019 11:21    Procedures Procedures (including critical care time)  Medications Ordered in ED Medications - No data to display   Initial Impression /  Assessment and Plan / ED Course  I have reviewed the triage vital signs and the nursing notes.  Pertinent labs & imaging results that were available during my care of the patient were reviewed by me and considered in my medical decision making (see chart for details).     LORRI FUKUHARA is an 85 year old female with history of dementia who presents to the ED after mechanical fall.  Patient with normal vitals.  No fever.  Patient with normal neurological exam other than that she is slightly confused at baseline.  Patient with abrasions to her palms bilaterally, her nose.  Has small puncture just above the lip that is hemostatic.  Has inner lip abrasion.  Patient is not on blood thinners.  Patient is in a cervical collar.  Has no hip pain, lower leg pain.  No midline spinal pain.  Is at her neurologic baseline per family.  Head CT, face CT, neck CT was obtained that was unremarkable.  Extremity imaging showed no acute fractures or malalignment.  Patient does not have any tenderness over bilateral snuffbox area.  Likely patient with normal variant to scaphoid area as suggested by x-ray read.  No pneumothorax, no rib fractures.  Pelvic x-ray negative.  Patient overall likely with mechanical fall.  Discharged back to facility.  Patient had basic wound care and had one Steri-Strip placed over minor facial wound.  Given return precautions.  Recommend follow-up with primary care doctor.  Case management was consulted for walker and physical therapy at home.  This chart was dictated using voice recognition software.  Despite best efforts to  proofread,  errors can occur which can change the documentation meaning.    Final Clinical Impressions(s) / ED Diagnoses   Final diagnoses:  Abrasion of face, initial encounter  Fall, initial encounter    ED Discharge Orders    None       Lennice Sites, DO 02/13/19 1217

## 2019-02-13 NOTE — ED Triage Notes (Signed)
Per [facility], [place] [facility] [place] she had a ground level mechanical fall outside on the sidewalk. Pt tripped and fell face forward, struck face, has abrasions to palms, nose and puncture through upper lip. VSS. Pt has dementia and is at baseline per family. No loc and not on thinners.

## 2019-02-23 ENCOUNTER — Non-Acute Institutional Stay: Payer: Medicare Other | Admitting: Internal Medicine

## 2019-02-23 ENCOUNTER — Encounter: Payer: Self-pay | Admitting: Internal Medicine

## 2019-02-23 VITALS — BP 122/70 | HR 60 | Temp 98.4°F | Ht 60.0 in | Wt 127.0 lb

## 2019-02-23 DIAGNOSIS — F0281 Dementia in other diseases classified elsewhere with behavioral disturbance: Secondary | ICD-10-CM | POA: Diagnosis not present

## 2019-02-23 DIAGNOSIS — E7800 Pure hypercholesterolemia, unspecified: Secondary | ICD-10-CM | POA: Diagnosis not present

## 2019-02-23 DIAGNOSIS — N3281 Overactive bladder: Secondary | ICD-10-CM

## 2019-02-23 DIAGNOSIS — G301 Alzheimer's disease with late onset: Secondary | ICD-10-CM

## 2019-02-23 DIAGNOSIS — W19XXXA Unspecified fall, initial encounter: Secondary | ICD-10-CM

## 2019-02-23 DIAGNOSIS — F22 Delusional disorders: Secondary | ICD-10-CM | POA: Diagnosis not present

## 2019-02-23 DIAGNOSIS — F02818 Dementia in other diseases classified elsewhere, unspecified severity, with other behavioral disturbance: Secondary | ICD-10-CM

## 2019-02-23 DIAGNOSIS — M8589 Other specified disorders of bone density and structure, multiple sites: Secondary | ICD-10-CM | POA: Diagnosis not present

## 2019-02-23 NOTE — Progress Notes (Signed)
Location:  [facility] clinic Provider: Mordechai Matuszak L. Mariea Clonts, D.O., C.M.D.  Code Status: DNR Goals of Care:  Advanced Directives 02/13/2019  Does Patient Have a Medical Advance Directive? No  Type of Advance Directive -  Does patient want to make changes to medical advance directive? -  Copy of [place] in Chart? -  Would patient like information on creating a medical advance directive? No - Patient declined   Chief Complaint  Patient presents with  . Acute Visit    follow-up from ED, fall  . fall risk    moderate    HPI: Patient is a 85 y.o. female seen today for ED follow-up.  She fell 02/13/19.  They did a ton of imaging and found nothing serious wrong.  Both hands had some bad abrasions on the sidewalk.  She has a little abrasion on her nose.  She has slowed down and shuffles some now.  She had struck an uneven spot and went down.  Blood was coming from her nose and mouth and didn't understand what had happened.  Teeth had almost gone through her lip.  She did not have much pain from it.  She did not understand the next day where her injuries came from.  She is now holding his hand when they walk so he can keep track of her.    Her appetite is slowing down a little lately.  She may want to skip lunch, but then she's ready for cookies in the late afternoon, but then doesn't want to go dinner.  Dinner is typically a good meal usually.  She argues about taking her pills now.  She's getting a stubborn streak.  Refuses at noon and night time.  She hid one once and Iona Beard found it later.  She is picking the raisins out of the bread pudding.  The spells of her not recognizing him are happening more often.  She did not know him this morning.    A week or so ago, she woke up and asked him who he was and what he was doing in her bed.    1.5 yrs ago, they had the caretaker come in and didn't like it or understand it.  She's accepting more help now--Velda.      She's pulling off the  exelon patches now even on her back.  We discussed trying a pill instead--he has more success with those than he does with patches for her that she removes most of the time.  The concern might be decreasing appetite with a pill version.    She is struggling to dress now--she's putting the wrong shoes on her opposite feet now.  Past Medical History:  Diagnosis Date  . Alzheimer's disease ([employer])   . High cholesterol   . Uterine cancer [facility])     Past Surgical History:  Procedure Laterality Date  . [facility]  . cataract surgery Bilateral     Allergies  Allergen Reactions  . Penicillins Hives  . Sulfa Antibiotics Hives    Outpatient Encounter Medications as of 02/23/2019  Medication Sig  . Cholecalciferol (VITAMIN D3) 2000 units TABS Take 1 tablet by mouth daily.  . Glucosamine-Chondroit-Vit C-Mn (GLUCOSAMINE CHONDR 1500 COMPLX PO) Take 1 tablet by mouth 2 (two) times daily.  . Melatonin 1 MG TABS Take by mouth at bedtime.  . memantine (NAMENDA XR) 28 MG CP24 24 hr capsule Take 1 tablet daily  . Multiple Vitamins-Minerals (MULTIVITAMIN  WITH MINERALS) tablet Take 1 tablet by mouth daily.  Marland Kitchen MYRBETRIQ 50 MG TB24 tablet TAKE 1 TABLET DAILY  . rivastigmine (EXELON) 9.5 mg/24hr Place 1 patch (9.5 mg total) onto the skin daily.  . simvastatin (ZOCOR) 20 MG tablet Take 1 tablet (20 mg total) by mouth daily.   No facility-administered encounter medications on file as of 02/23/2019.     Review of Systems:  Review of Systems  Constitutional: Negative for chills, fever and malaise/fatigue.  Eyes: Negative for blurred vision.  Respiratory: Negative for cough and shortness of breath.   Cardiovascular: Negative for chest pain, palpitations and leg swelling.  Gastrointestinal: Negative for abdominal pain, blood in stool, constipation, diarrhea and melena.  Genitourinary: Negative for dysuria.  Musculoskeletal: Positive for falls. Negative for joint pain.    Skin: Negative for itching and rash.  Neurological: Negative for dizziness and loss of consciousness.  Endo/Heme/Allergies: Does not bruise/bleed easily.       Did not bruise with terrible fall she had  Psychiatric/Behavioral: Positive for memory loss. Negative for depression. The patient is not nervous/anxious and does not have insomnia.     Health Maintenance  Topic Date Due  . COLONOSCOPY  01/22/2017  . MAMMOGRAM  04/07/2017  . TETANUS/TDAP  12/30/2023 (Originally 12/05/1952)  . INFLUENZA VACCINE  Completed  . DEXA SCAN  Completed  . PNA vac Low Risk Adult  Completed    Physical Exam: Vitals:   02/23/19 1329  BP: 122/70  Pulse: 60  Temp: 98.4 F (36.9 C)  TempSrc: Oral  SpO2: 95%  Weight: 127 lb (57.6 kg)  Height: 5' (1.524 m)   Body mass index is 24.8 kg/m. Physical Exam Vitals signs reviewed.  Constitutional:      Appearance: Normal appearance.  HENT:     Head: Normocephalic and atraumatic.  Cardiovascular:     Rate and Rhythm: Normal rate and regular rhythm.     Pulses: Normal pulses.     Heart sounds: Normal heart sounds.  Pulmonary:     Effort: Pulmonary effort is normal.     Breath sounds: Normal breath sounds.  Abdominal:     General: Bowel sounds are normal.  Musculoskeletal: Normal range of motion.     Comments: Walking now holding onto husband's hand  Skin:    General: Skin is warm and dry.     Capillary Refill: Capillary refill takes less than 2 seconds.  Neurological:     General: No focal deficit present.     Mental Status: She is alert.     Cranial Nerves: No cranial nerve deficit.     Motor: No weakness.     Gait: Gait abnormal.     Comments: Oriented to person, but does not always recognize her husband; no longer picking up her feet as well, less steady  Psychiatric:        Mood and Affect: Mood normal.     Labs reviewed: Basic Metabolic Panel: Recent Labs    06/22/18  NA 144  K 4.6  BUN 25*  CREATININE 0.7   Liver Function  Tests: Recent Labs    06/22/18 0600  AST 22  ALT 14  ALKPHOS 72   No results for input(s): LIPASE, AMYLASE in the last 8760 hours. No results for input(s): AMMONIA in the last 8760 hours. CBC: Recent Labs    06/22/18  WBC 4.9  HGB 12.7  HCT 37  PLT 192   Lipid Panel: Recent Labs    06/22/18  CHOL  143  HDL 56  LDLCALC 72  TRIG 74   No results found for: HGBA1C  Procedures since last visit: Dg Chest 1 View  Result Date: 02/13/2019 CLINICAL DATA:  Pain after fall EXAM: CHEST  1 VIEW COMPARISON:  None. FINDINGS: The heart size and mediastinal contours are within normal limits. Both lungs are clear. The visualized skeletal structures are unremarkable. IMPRESSION: No active disease. Electronically Signed   By: Dorise Bullion III M.D   On: 02/13/2019 11:55   Dg Wrist Complete Left  Result Date: 02/13/2019 CLINICAL DATA:  Fall.  Pain. EXAM: LEFT WRIST - COMPLETE 3+ VIEW COMPARISON:  None. FINDINGS: There is a mildly unusual configuration of the scaphoid but the configuration is similar to the right. No convincing evidence of scaphoid fracture. No other fractures identified. IMPRESSION: There is a mildly unusual configuration to the scaphoid which is similar on the left to the right and probably normal for this patient. Recommend clinical correlation to exclude snuffbox tenderness. No convincing evidence of fracture on this study. Electronically Signed   By: Dorise Bullion III M.D   On: 02/13/2019 11:47   Dg Wrist Complete Right  Result Date: 02/13/2019 CLINICAL DATA:  Pain after fall EXAM: RIGHT WRIST - COMPLETE 3+ VIEW COMPARISON:  None. FINDINGS: There is no evidence of fracture or dislocation. There is no evidence of arthropathy or other focal bone abnormality. Soft tissues are unremarkable. IMPRESSION: Negative. Electronically Signed   By: Dorise Bullion III M.D   On: 02/13/2019 11:52   Ct Head Wo Contrast  Result Date: 02/13/2019 CLINICAL DATA:  [place] to the sidewalk. Trauma  to the head, face and neck. EXAM: CT HEAD WITHOUT CONTRAST CT MAXILLOFACIAL WITHOUT CONTRAST CT CERVICAL SPINE WITHOUT CONTRAST TECHNIQUE: Multidetector CT imaging of the head, cervical spine, and maxillofacial structures were performed using the standard protocol without intravenous contrast. Multiplanar CT image reconstructions of the cervical spine and maxillofacial structures were also generated. COMPARISON:  None. FINDINGS: CT HEAD FINDINGS Brain: Age related atrophy. Chronic small-vessel ischemic changes throughout the white matter. No sign of acute infarction, mass lesion, hemorrhage, hydrocephalus or extra-axial collection. Vascular: There is atherosclerotic calcification of the major vessels at the base of the brain. Skull: Negative Other: None CT MAXILLOFACIAL FINDINGS Osseous: No evidence of facial fracture. Orbits: No evidence of orbital injury. Sinuses: Clear Soft tissues: No significant soft tissue finding by CT. CT CERVICAL SPINE FINDINGS Alignment: Normal Skull base and vertebrae: No fracture. Superior endplate Schmorl's nodes at C7. Soft tissues and spinal canal: Negative Disc levels: Ordinary spondylosis and facet osteoarthritis. No apparent bony canal stenosis or significant foraminal narrowing. Upper chest: Mild scarring.  No active process. Other: None IMPRESSION: Head CT: No acute or traumatic finding. Atrophy and chronic small-vessel ischemic changes. Maxillofacial CT: No acute or traumatic finding. Cervical spine CT: No acute or traumatic finding. Ordinary mild degenerative changes fairly typical for age. Electronically Signed   By: Nelson Chimes M.D.   On: 02/13/2019 11:21   Ct Cervical Spine Wo Contrast  Result Date: 02/13/2019 CLINICAL DATA:  [place] to the sidewalk. Trauma to the head, face and neck. EXAM: CT HEAD WITHOUT CONTRAST CT MAXILLOFACIAL WITHOUT CONTRAST CT CERVICAL SPINE WITHOUT CONTRAST TECHNIQUE: Multidetector CT imaging of the head, cervical spine, and maxillofacial  structures were performed using the standard protocol without intravenous contrast. Multiplanar CT image reconstructions of the cervical spine and maxillofacial structures were also generated. COMPARISON:  None. FINDINGS: CT HEAD FINDINGS Brain: Age related atrophy. Chronic small-vessel ischemic  changes throughout the white matter. No sign of acute infarction, mass lesion, hemorrhage, hydrocephalus or extra-axial collection. Vascular: There is atherosclerotic calcification of the major vessels at the base of the brain. Skull: Negative Other: None CT MAXILLOFACIAL FINDINGS Osseous: No evidence of facial fracture. Orbits: No evidence of orbital injury. Sinuses: Clear Soft tissues: No significant soft tissue finding by CT. CT CERVICAL SPINE FINDINGS Alignment: Normal Skull base and vertebrae: No fracture. Superior endplate Schmorl's nodes at C7. Soft tissues and spinal canal: Negative Disc levels: Ordinary spondylosis and facet osteoarthritis. No apparent bony canal stenosis or significant foraminal narrowing. Upper chest: Mild scarring.  No active process. Other: None IMPRESSION: Head CT: No acute or traumatic finding. Atrophy and chronic small-vessel ischemic changes. Maxillofacial CT: No acute or traumatic finding. Cervical spine CT: No acute or traumatic finding. Ordinary mild degenerative changes fairly typical for age. Electronically Signed   By: Nelson Chimes M.D.   On: 02/13/2019 11:21   Dg Hand Complete Left  Result Date: 02/13/2019 CLINICAL DATA:  Pain after fall EXAM: LEFT HAND - COMPLETE 3+ VIEW COMPARISON:  None. FINDINGS: There is no evidence of fracture or dislocation. There is no evidence of arthropathy or other focal bone abnormality. Soft tissues are unremarkable. IMPRESSION: Negative. Electronically Signed   By: Dorise Bullion III M.D   On: 02/13/2019 11:54   Dg Hand Complete Right  Result Date: 02/13/2019 CLINICAL DATA:  Pain after fall EXAM: RIGHT HAND - COMPLETE 3+ VIEW COMPARISON:  None.  FINDINGS: Degenerative changes primarily in the thumb. No fracture dislocation. IMPRESSION: Degenerative changes in the thumb.  No fracture or dislocation. Electronically Signed   By: Dorise Bullion III M.D   On: 02/13/2019 11:54   Dg Hip Unilat With Pelvis 2-3 Views Left  Result Date: 02/13/2019 CLINICAL DATA:  Pain after fall EXAM: DG HIP (WITH OR WITHOUT PELVIS) 2-3V LEFT COMPARISON:  None. FINDINGS: There is no evidence of hip fracture or dislocation. There is no evidence of arthropathy or other focal bone abnormality. IMPRESSION: Negative. Electronically Signed   By: Dorise Bullion III M.D   On: 02/13/2019 11:53   Ct Maxillofacial Wo Contrast  Result Date: 02/13/2019 CLINICAL DATA:  [place] to the sidewalk. Trauma to the head, face and neck. EXAM: CT HEAD WITHOUT CONTRAST CT MAXILLOFACIAL WITHOUT CONTRAST CT CERVICAL SPINE WITHOUT CONTRAST TECHNIQUE: Multidetector CT imaging of the head, cervical spine, and maxillofacial structures were performed using the standard protocol without intravenous contrast. Multiplanar CT image reconstructions of the cervical spine and maxillofacial structures were also generated. COMPARISON:  None. FINDINGS: CT HEAD FINDINGS Brain: Age related atrophy. Chronic small-vessel ischemic changes throughout the white matter. No sign of acute infarction, mass lesion, hemorrhage, hydrocephalus or extra-axial collection. Vascular: There is atherosclerotic calcification of the major vessels at the base of the brain. Skull: Negative Other: None CT MAXILLOFACIAL FINDINGS Osseous: No evidence of facial fracture. Orbits: No evidence of orbital injury. Sinuses: Clear Soft tissues: No significant soft tissue finding by CT. CT CERVICAL SPINE FINDINGS Alignment: Normal Skull base and vertebrae: No fracture. Superior endplate Schmorl's nodes at C7. Soft tissues and spinal canal: Negative Disc levels: Ordinary spondylosis and facet osteoarthritis. No apparent bony canal stenosis or significant  foraminal narrowing. Upper chest: Mild scarring.  No active process. Other: None IMPRESSION: Head CT: No acute or traumatic finding. Atrophy and chronic small-vessel ischemic changes. Maxillofacial CT: No acute or traumatic finding. Cervical spine CT: No acute or traumatic finding. Ordinary mild degenerative changes fairly typical for age.  Electronically Signed   By: Nelson Chimes M.D.   On: 02/13/2019 11:21    Assessment/Plan 1. Dementia of the Alzheimer's type, with late onset, with delusions ([place]) -progressing--now at moderate  stage with h/o wandering (not recent), new fall, more difficulty with ADLs--stage 6a--needing help with dressing, forgets her husband -now struggling some with taking pills and is peeling her exelon patches off--recommended alternating b/w only places in central back where she cannot reach and consider change to oral med instead -cont namenda  2. OAB (overactive bladder) -cont myrbetriq  3. Fall, initial encounter -will recommend physical therapy for balance and fall prevention, helping resident to pick up feet when walking  4. Pure hypercholesterolemia -cont simvastatin therapy for now--might stop if she still struggles with pills when given all together in the am -has labs before I see her next time  5. Osteopenia of multiple sites -continue vitamin D and walking for exercise/weightbearing  Labs/tests ordered:  Already has labs ordered before routine visit  Next appt:  04/27/2019  Charlea Nardo L. Doll Frazee, D.O. [facility] [address], [place] [address] Cell Phone (Mon-Fri 8am-5pm):  [phone] On Call:  [phone] & follow prompts after 5pm & weekends Office Phone:  [phone] Office Fax:  [phone]

## 2019-02-23 NOTE — Patient Instructions (Signed)
I recommend moving all the pills to the morning since she tolerates taking her medication earlier on.  Of course, melatonin must be at bedtime.    Discuss the possible change in memory medicine with Dr. Delice Lesch, as well, since you are seeing her coming up.  I'm happy to manage the memory going forward, also.

## 2019-03-02 DIAGNOSIS — Z9181 History of falling: Secondary | ICD-10-CM | POA: Diagnosis not present

## 2019-03-02 DIAGNOSIS — R278 Other lack of coordination: Secondary | ICD-10-CM | POA: Diagnosis not present

## 2019-03-02 DIAGNOSIS — R2689 Other abnormalities of gait and mobility: Secondary | ICD-10-CM | POA: Diagnosis not present

## 2019-03-02 DIAGNOSIS — F0150 Vascular dementia without behavioral disturbance: Secondary | ICD-10-CM | POA: Diagnosis not present

## 2019-03-03 ENCOUNTER — Telehealth: Payer: Self-pay | Admitting: *Deleted

## 2019-03-03 NOTE — Telephone Encounter (Signed)
-----   Message from Gayland Curry, DO sent at 03/03/2019  1:40 PM EST ----- Please call Mr. Correll and let him know that Dr. Delice Lesch and I have communicated and she agrees that trying to switch his wife's exelon patch from the patch to a pill may be a good next step because of her removing the patches frequently. We also discussed whether it was necessary to continue to go to neurology and she agreed it was ok for me (Dr. Mariea Clonts) to manage Camesha's dementia.  Please confirm with Mr. Bowden if he's on board with this plan and then I can send in the exelon pills.    ----- Message ----- From: Cameron Sprang, MD Sent: 02/24/2019  11:22 AM EST To: Gayland Curry, DO  Thanks for the update! I agree that switching to the oral rivastigmine might be best at this point. Did he want [place] to call him about it? I don't think we have heard from him. Thanks for your help in her care, I'm certainly okay with you managing her dementia, if any new neurological issues arise, I'm happy to help.  Santiago Glad ----- Message ----- From: Gayland Curry, DO Sent: 02/23/2019   2:36 PM EST To: Cameron Sprang, MD  Dr. Delice Lesch,  Please see my note.  Mr Licklider was wanting your opinion about changing Mrs' acetylcholinestese inhibitor.  She's been on exelon patch but has begun peeling them off except most centrally on her back (which I recommended alternating only over those locations for now).  He thought their appt was next month, but it's in May.  I'm also happy to manage her dementia here.  He's still of the mindset that a neurologist is needed to treat dementia.   Thanks, [employer]

## 2019-03-09 ENCOUNTER — Other Ambulatory Visit: Payer: Self-pay | Admitting: Internal Medicine

## 2019-03-09 DIAGNOSIS — F22 Delusional disorders: Principal | ICD-10-CM

## 2019-03-09 DIAGNOSIS — F0281 Dementia in other diseases classified elsewhere with behavioral disturbance: Secondary | ICD-10-CM

## 2019-03-09 DIAGNOSIS — F02818 Dementia in other diseases classified elsewhere, unspecified severity, with other behavioral disturbance: Secondary | ICD-10-CM

## 2019-03-09 DIAGNOSIS — G301 Alzheimer's disease with late onset: Secondary | ICD-10-CM

## 2019-03-09 MED ORDER — RIVASTIGMINE TARTRATE 3 MG PO CAPS: 3.0000 mg | ORAL_CAPSULE | Freq: Two times a day (BID) | ORAL | 3 refills | Status: DC

## 2019-03-09 NOTE — Telephone Encounter (Signed)
Karen Mclean can you please help me with this, if you don't get him again today, I'll try again tomorrow.

## 2019-03-09 NOTE — Telephone Encounter (Signed)
Patient husband Karen Mclean Notified and agreed. Stated to go ahead and send the Exelon pills into [facility] that he was on board and thinks its a good idea.

## 2019-03-09 NOTE — Telephone Encounter (Signed)
LMOM for Mr Mccleary to Return call.

## 2019-03-09 NOTE — Progress Notes (Signed)
Rx sent for exelon capsules.  Stop patches when capsules received.  Continue namenda as is.

## 2019-03-09 NOTE — Telephone Encounter (Signed)
-----   Message from Hart Robinsons sent at 03/09/2019 12:12 PM EDT ----- Regarding: Phone Call Contact: [phone] Patient's husband  Mariah Gerstenberger called and he stated that their son has received a couple of phone calls from [facility].  They are unsure of who called, but the caller asked them to return the call.  Patient's next appointment with Dr. Mariea Clonts is 04/26/18.  Mr. Pincock is concerned that calls may be in regards to patient's physical therapy.  Please call patient.

## 2019-03-09 NOTE — Telephone Encounter (Signed)
Rx sent as per Karen Mclean request.

## 2019-04-19 DIAGNOSIS — M150 Primary generalized (osteo)arthritis: Secondary | ICD-10-CM | POA: Diagnosis not present

## 2019-04-19 DIAGNOSIS — E785 Hyperlipidemia, unspecified: Secondary | ICD-10-CM | POA: Diagnosis not present

## 2019-04-19 DIAGNOSIS — D649 Anemia, unspecified: Secondary | ICD-10-CM | POA: Diagnosis not present

## 2019-04-19 DIAGNOSIS — M8589 Other specified disorders of bone density and structure, multiple sites: Secondary | ICD-10-CM | POA: Diagnosis not present

## 2019-04-19 LAB — HEPATIC FUNCTION PANEL
ALT: 12 (ref 7–35)
AST: 22 (ref 13–35)
Alkaline Phosphatase: 64 (ref 25–125)
Bilirubin, Total: 0.7

## 2019-04-19 LAB — BASIC METABOLIC PANEL
BUN: 17 (ref 4–21)
Creatinine: 0.9 (ref 0.5–1.1)
Glucose: 88
Potassium: 4.3 (ref 3.4–5.3)
Sodium: 145 (ref 137–147)

## 2019-04-19 LAB — CBC AND DIFFERENTIAL
HCT: 39 (ref 36–46)
Hemoglobin: 13.5 (ref 12.0–16.0)
Platelets: 221 (ref 150–399)
WBC: 5.2

## 2019-04-19 LAB — LIPID PANEL
Cholesterol: 138 (ref 0–200)
HDL: 59 (ref 35–70)
LDL Cholesterol: 59
Triglycerides: 100 (ref 40–160)

## 2019-04-21 ENCOUNTER — Encounter: Payer: Self-pay | Admitting: Internal Medicine

## 2019-04-27 ENCOUNTER — Encounter: Payer: Self-pay | Admitting: Internal Medicine

## 2019-04-27 ENCOUNTER — Other Ambulatory Visit: Payer: Self-pay

## 2019-04-27 ENCOUNTER — Non-Acute Institutional Stay: Payer: Medicare Other | Admitting: Internal Medicine

## 2019-04-27 VITALS — BP 118/60 | HR 68 | Temp 97.7°F | Ht 60.0 in | Wt 121.0 lb

## 2019-04-27 DIAGNOSIS — F22 Delusional disorders: Secondary | ICD-10-CM | POA: Diagnosis not present

## 2019-04-27 DIAGNOSIS — E7800 Pure hypercholesterolemia, unspecified: Secondary | ICD-10-CM | POA: Diagnosis not present

## 2019-04-27 DIAGNOSIS — M150 Primary generalized (osteo)arthritis: Secondary | ICD-10-CM

## 2019-04-27 DIAGNOSIS — G301 Alzheimer's disease with late onset: Secondary | ICD-10-CM | POA: Diagnosis not present

## 2019-04-27 DIAGNOSIS — R4689 Other symptoms and signs involving appearance and behavior: Secondary | ICD-10-CM

## 2019-04-27 DIAGNOSIS — N3281 Overactive bladder: Secondary | ICD-10-CM | POA: Diagnosis not present

## 2019-04-27 DIAGNOSIS — M8949 Other hypertrophic osteoarthropathy, multiple sites: Secondary | ICD-10-CM

## 2019-04-27 DIAGNOSIS — F0281 Dementia in other diseases classified elsewhere with behavioral disturbance: Secondary | ICD-10-CM

## 2019-04-27 DIAGNOSIS — M159 Polyosteoarthritis, unspecified: Secondary | ICD-10-CM

## 2019-04-27 DIAGNOSIS — M8589 Other specified disorders of bone density and structure, multiple sites: Secondary | ICD-10-CM

## 2019-04-27 DIAGNOSIS — F02818 Dementia in other diseases classified elsewhere, unspecified severity, with other behavioral disturbance: Secondary | ICD-10-CM

## 2019-04-27 NOTE — Progress Notes (Signed)
Location:  Occupational psychologist of Service:  Clinic (12)  Provider: Fonda Rochon L. Mariea Clonts, D.O., C.M.D.  Goals of Care:  Advanced Directives 04/27/2019  Does Patient Have a Medical Advance Directive? Yes  Type of Paramedic of [place];Living will  Does patient want to make changes to medical advance directive? No - Patient declined  Copy of [place] in Chart? Yes - validated most recent copy scanned in chart (See row information)  Would patient like information on creating a medical advance directive? -   Chief Complaint  Patient presents with  . Medical Management of Chronic Issues    33mth follow-up, discuss labs    HPI: Patient is a 85 y.o. female seen today for medical management of chronic diseases.    Yesterday, she was having irritated eyes.  Lubricating drops worked.  Today she is fine.    She had a fall two months ago.  She tripped when they were going to church.  They had parked across from [facility].  She had skinned up her nose and she bled from her nose and mouth.  Hands were scraped up a bit from breaking her fall.  She tends to shuffle more when walking now.    Appetite is variable.  One day last week, she had breakfast but very little otherwise.  Other days, she'll eat more out of the pantry after she had plenty of food.  Not a significant issue.    She still argues with taking pills sometimes especially at night.  He has to explain what they're for.  Uses the pillboxes for her.  She does finally take them eventually.  Pills in place of exelon patch have helped.  He got her an appt with urology for OAB.  She had seen one in [place].  It's gotten considerably worse lately and she has to go in the middle of things if she does not go just before.  She's seeing Dr. Louis Meckel.  She does take myrbetriq 50mg .  It's postponed.    Labs reviewed and doing great.    Her sundowning is quite apparent late in the  day--wants to go home.  They go through that.  Had another episode of not knowing who he was at 3am.  It no longer alarms her if she does not know who he is.  She sometimes thinks he's her boyfriend, Iona Beard, but then he asks who her husband is and she says him.  She knows her 2 children and her names.    Past Medical History:  Diagnosis Date  . Alzheimer's disease ([place])   . High cholesterol   . Uterine cancer [facility])     Past Surgical History:  Procedure Laterality Date  . [facility]  . cataract surgery Bilateral     Allergies  Allergen Reactions  . Penicillins Hives  . Sulfa Antibiotics Hives    Outpatient Encounter Medications as of 04/27/2019  Medication Sig  . Cholecalciferol (VITAMIN D3) 2000 units TABS Take 1 tablet by mouth daily.  . Glucosamine-Chondroit-Vit C-Mn (GLUCOSAMINE CHONDR 1500 COMPLX PO) Take 1 tablet by mouth 2 (two) times daily.  . Melatonin 1 MG TABS Take by mouth at bedtime.  . memantine (NAMENDA XR) 28 MG CP24 24 hr capsule Take 1 tablet daily  . Multiple Vitamins-Minerals (MULTIVITAMIN WITH MINERALS) tablet Take 1 tablet by mouth daily.  Marland Kitchen MYRBETRIQ 50 MG TB24 tablet TAKE 1 TABLET DAILY  .  rivastigmine (EXELON) 3 MG capsule Take 1 capsule (3 mg total) by mouth 2 (two) times daily.  . simvastatin (ZOCOR) 20 MG tablet Take 1 tablet (20 mg total) by mouth daily.   No facility-administered encounter medications on file as of 04/27/2019.     Review of Systems:  Review of Systems  Constitutional: Negative for chills, fever and malaise/fatigue.  HENT: Positive for hearing loss. Negative for congestion.        Itchy burning eyes yesterday, resolved with lubricating drops  Eyes: Negative for blurred vision.  Respiratory: Negative for cough and shortness of breath.   Cardiovascular: Negative for chest pain, palpitations and leg swelling.  Gastrointestinal: Negative for abdominal pain, blood in stool, constipation, diarrhea and  melena.  Genitourinary: Positive for frequency and urgency. Negative for dysuria, flank pain and hematuria.  Musculoskeletal: Negative for falls and joint pain.       I saw her after the fall end of feb  Skin: Negative for itching and rash.  Neurological: Negative for loss of consciousness and headaches.  Endo/Heme/Allergies: Bruises/bleeds easily.  Psychiatric/Behavioral: Positive for memory loss. Negative for depression. The patient is not nervous/anxious and does not have insomnia.        Sundowning in evenings    Health Maintenance  Topic Date Due  . COLONOSCOPY  01/22/2017  . MAMMOGRAM  04/07/2017  . INFLUENZA VACCINE  07/30/2019  . TETANUS/TDAP  10/16/2028  . DEXA SCAN  Completed  . PNA vac Low Risk Adult  Completed    Physical Exam: Vitals:   04/27/19 0919  BP: 118/60  Pulse: 68  Temp: 97.7 F (36.5 C)  TempSrc: Oral  SpO2: 97%  Weight: 121 lb (54.9 kg)  Height: 5' (1.524 m)   Body mass index is 23.63 kg/m. socially distant visit Physical Exam Vitals signs reviewed.  Constitutional:      General: She is not in acute distress.    Appearance: She is normal weight. She is not ill-appearing, toxic-appearing or diaphoretic.  HENT:     Head: Normocephalic and atraumatic.  Eyes:     Conjunctiva/sclera: Conjunctivae normal.     Comments: Some drainage from eyes has dried, but no erythema or active drainage now  Cardiovascular:     Pulses: Normal pulses.  Pulmonary:     Effort: Pulmonary effort is normal.  Musculoskeletal: Normal range of motion.  Skin:    General: Skin is warm and dry.     Coloration: Skin is pale.  Neurological:     General: No focal deficit present.     Mental Status: She is alert.     Comments: Shuffling gait, holds her husband's hand  Psychiatric:        Mood and Affect: Mood normal.     Labs reviewed: Basic Metabolic Panel: Recent Labs    06/22/18 04/19/19 0800  NA 144 145  K 4.6 4.3  BUN 25* 17  CREATININE 0.7 0.9   Liver  Function Tests: Recent Labs    06/22/18 0600 04/19/19 0800  AST 22 22  ALT 14 12  ALKPHOS 72 64   No results for input(s): LIPASE, AMYLASE in the last 8760 hours. No results for input(s): AMMONIA in the last 8760 hours. CBC: Recent Labs    06/22/18 04/19/19 0800  WBC 4.9 5.2  HGB 12.7 13.5  HCT 37 39  PLT 192 221   Lipid Panel: Recent Labs    06/22/18 04/19/19 0800  CHOL 143 138  HDL 56 59  LDLCALC 72  59  TRIG 74 100   Assessment/Plan 1. Dementia of the Alzheimer's type, with late onset, with delusions ([place]) -gradually progressing -cont namenda XR daily, exelon 3mg  po bid which is going better than the patches she was removing even from her back -struggles some with taking pills, but Iona Beard eventually gets them in her  2. OAB (overactive bladder) -cont myrbetriq 50mg  pending urology appt -suspect this is worsening due to functional decline from dementia  3. Pure hypercholesterolemia -cont statin therapy at this point, but would stop if she refuses her pills altogether--[name] did not want to quit giving her this  4. Osteopenia of multiple sites -cont D3 and weightbearing exercise  5. Primary osteoarthritis involving multiple joints -no complaints of pain recently, uses glucosamine-chondroitin  6. Wandering behavior -not an issue here recently -no longer getting upset when she awakens not knowing Iona Beard is her husband--she says he's her boyfriend and goes on with things peacefully, goes back to sleep rather than going out the door like she'd been doing--parameters are in place so this cannot happen without his and security's awareness also  Labs/tests ordered:  No new Next appt: 08/24/2019  Humbert Morozov L. Sheyna Pettibone, D.O. [facility] [address], [place] [address] Cell Phone (Mon-Fri 8am-5pm):  [phone] On Call:  [phone] & follow prompts after 5pm & weekends Office Phone:  [phone] Office Fax:   [phone]

## 2019-05-02 ENCOUNTER — Ambulatory Visit: Payer: Medicare Other | Admitting: Neurology

## 2019-05-02 ENCOUNTER — Encounter

## 2019-05-10 DIAGNOSIS — N3281 Overactive bladder: Secondary | ICD-10-CM | POA: Diagnosis not present

## 2019-05-10 DIAGNOSIS — N390 Urinary tract infection, site not specified: Secondary | ICD-10-CM | POA: Diagnosis not present

## 2019-06-06 ENCOUNTER — Other Ambulatory Visit: Payer: Self-pay | Admitting: Internal Medicine

## 2019-06-11 ENCOUNTER — Other Ambulatory Visit: Payer: Self-pay | Admitting: Internal Medicine

## 2019-06-11 DIAGNOSIS — E7800 Pure hypercholesterolemia, unspecified: Secondary | ICD-10-CM

## 2019-08-24 ENCOUNTER — Non-Acute Institutional Stay: Payer: Medicare Other | Admitting: Internal Medicine

## 2019-08-24 ENCOUNTER — Other Ambulatory Visit: Payer: Self-pay

## 2019-08-24 ENCOUNTER — Encounter: Payer: Self-pay | Admitting: Internal Medicine

## 2019-08-24 VITALS — BP 120/60 | HR 68 | Temp 97.8°F | Ht 60.0 in | Wt 116.0 lb

## 2019-08-24 DIAGNOSIS — E7800 Pure hypercholesterolemia, unspecified: Secondary | ICD-10-CM | POA: Diagnosis not present

## 2019-08-24 DIAGNOSIS — F22 Delusional disorders: Secondary | ICD-10-CM | POA: Diagnosis not present

## 2019-08-24 DIAGNOSIS — F0281 Dementia in other diseases classified elsewhere with behavioral disturbance: Secondary | ICD-10-CM

## 2019-08-24 DIAGNOSIS — M8589 Other specified disorders of bone density and structure, multiple sites: Secondary | ICD-10-CM | POA: Diagnosis not present

## 2019-08-24 DIAGNOSIS — R4689 Other symptoms and signs involving appearance and behavior: Secondary | ICD-10-CM

## 2019-08-24 DIAGNOSIS — M8949 Other hypertrophic osteoarthropathy, multiple sites: Secondary | ICD-10-CM

## 2019-08-24 DIAGNOSIS — N3281 Overactive bladder: Secondary | ICD-10-CM | POA: Diagnosis not present

## 2019-08-24 DIAGNOSIS — G301 Alzheimer's disease with late onset: Secondary | ICD-10-CM | POA: Diagnosis not present

## 2019-08-24 DIAGNOSIS — M150 Primary generalized (osteo)arthritis: Secondary | ICD-10-CM | POA: Diagnosis not present

## 2019-08-24 DIAGNOSIS — F02818 Dementia in other diseases classified elsewhere, unspecified severity, with other behavioral disturbance: Secondary | ICD-10-CM

## 2019-08-24 DIAGNOSIS — M159 Polyosteoarthritis, unspecified: Secondary | ICD-10-CM

## 2019-08-24 MED ORDER — MEMANTINE HCL ER 28 MG PO CP24: 28.0000 mg | ORAL_CAPSULE | Freq: Every day | ORAL | 3 refills | Status: AC

## 2019-08-24 NOTE — Progress Notes (Signed)
Location:  Occupational psychologist of Service:  Clinic (12)  Provider: Yasin Ducat L. Mariea Clonts, D.O., C.M.D.  Code Status: DNR Goals of Care:  Advanced Directives 04/27/2019  Does Patient Have a Medical Advance Directive? Yes  Type of Paramedic of [place];Living will  Does patient want to make changes to medical advance directive? No - Patient declined  Copy of [place] in Chart? Yes - validated most recent copy scanned in chart (See row information)  Would patient like information on creating a medical advance directive? -    Chief Complaint  Patient presents with  . Medical Management of Chronic Issues    56mth follow-up    HPI: Patient is a 85 y.o. female seen today for medical management of chronic diseases.    Her husband, Iona Beard, is having to help her with eating--forgets how to use knife and fork and must eat a lot with her hands.  He also helps her with getting dressed.  She fusses about taking her pills.  She is sneaky and hides her pills occasionally.  Yesterday, she put her pills in with his when he put them out on the two plates.  She also needs a reminder of how to take the pills properly also.  She mixes up her shoes--two different ones or on the wrong feet.  Appetite is variable--ate everything today, but sometimes won't hardly eat anything.  Loves dessert.  She will "raid the cookie jar" late afternoon if he's not paying attention for a few minutes.  He cannot lock the kitchen door.  Things are occasionally moved from the fridge to the freezer.  She does like to help out in the kitchen, but then puts things in the wrong place so having her help is not useful.  She does mean well.  She still makes the bed daily.  Quilt may go on sideways.    He does have some help if he has to go out.  He has an eye appt and a caregiver is coming next week.    Overactive bladder--continued on the myrbetriq when seen by urology.  She  had seen urology on her own long ago.  No complaints.  No accidents.  She stuffs wads of toilet paper in her underwear despite pads he bought, but she not had leakage.  Has frequency.  He takes her to go before they go anywhere or start anything.  No problem with sleeping at night lately.  She also nods off during tv and in daytime at times.  She falls asleep if he's in the other room.  She does not really understand the tv anymore and can't read the magazines.  No recent wandering b/c of the tune playing when the door opens.  She is ready for bed early, but may want to "go home" at that time.    She has no pain lately.    She has lost 5 lbs in 4 months.  They are getting all take out and they get the two meals at lunch and they eat one meal b/w them and the other at night.  Past Medical History:  Diagnosis Date  . Alzheimer's disease ([place])   . High cholesterol   . Uterine cancer [facility])     Past Surgical History:  Procedure Laterality Date  . [facility]  . cataract surgery Bilateral     Allergies  Allergen Reactions  . Penicillins Hives  .  Sulfa Antibiotics Hives    Outpatient Encounter Medications as of 08/24/2019  Medication Sig  . Cholecalciferol (VITAMIN D3) 2000 units TABS Take 1 tablet by mouth daily.  . Glucosamine-Chondroit-Vit C-Mn (GLUCOSAMINE CHONDR 1500 COMPLX PO) Take 1 tablet by mouth 2 (two) times daily.  . Melatonin 1 MG TABS Take by mouth at bedtime.  . memantine (NAMENDA XR) 28 MG CP24 24 hr capsule Take 1 capsule (28 mg total) by mouth daily.  . Multiple Vitamins-Minerals (MULTIVITAMIN WITH MINERALS) tablet Take 1 tablet by mouth daily.  Marland Kitchen MYRBETRIQ 50 MG TB24 tablet TAKE 1 TABLET DAILY  . rivastigmine (EXELON) 3 MG capsule Take 1 capsule (3 mg total) by mouth 2 (two) times daily.  . simvastatin (ZOCOR) 20 MG tablet TAKE 1 TABLET DAILY  . [DISCONTINUED] memantine (NAMENDA XR) 28 MG CP24 24 hr capsule Take 1 tablet daily   No  facility-administered encounter medications on file as of 08/24/2019.     Review of Systems:  Review of Systems  Constitutional: Positive for weight loss. Negative for chills, fever and malaise/fatigue.  HENT: Negative for congestion.   Eyes: Negative for blurred vision.  Respiratory: Negative for cough and shortness of breath.   Cardiovascular: Negative for chest pain, palpitations and leg swelling.  Gastrointestinal: Negative for abdominal pain, blood in stool, constipation, diarrhea, melena, nausea and vomiting.  Genitourinary: Positive for frequency and urgency. Negative for dysuria.       OAB  Musculoskeletal: Negative for falls and joint pain.  Skin: Negative for itching and rash.  Neurological: Negative for dizziness and loss of consciousness.  Psychiatric/Behavioral: Positive for memory loss. Negative for depression. The patient is not nervous/anxious and does not have insomnia.     Health Maintenance  Topic Date Due  . COLONOSCOPY  01/22/2017  . MAMMOGRAM  04/07/2017  . INFLUENZA VACCINE  07/30/2019  . TETANUS/TDAP  10/16/2028  . DEXA SCAN  Completed  . PNA vac Low Risk Adult  Completed    Physical Exam: Vitals:   08/24/19 0931  BP: 120/60  Pulse: 68  Temp: 97.8 F (36.6 C)  TempSrc: Oral  SpO2: 97%  Weight: 116 lb (52.6 kg)  Height: 5' (1.524 m)   Body mass index is 22.65 kg/m. Physical Exam Constitutional:      General: She is not in acute distress.    Appearance: Normal appearance. She is normal weight. She is not toxic-appearing.  HENT:     Head: Normocephalic and atraumatic.  Cardiovascular:     Rate and Rhythm: Normal rate and regular rhythm.     Pulses: Normal pulses.     Heart sounds: Normal heart sounds.  Pulmonary:     Effort: Pulmonary effort is normal.     Breath sounds: Normal breath sounds. No wheezing, rhonchi or rales.  Abdominal:     General: Bowel sounds are normal. There is no distension.     Palpations: Abdomen is soft. There is no  mass.     Tenderness: There is no abdominal tenderness.  Musculoskeletal: Normal range of motion.     Right lower leg: No edema.     Left lower leg: No edema.  Skin:    General: Skin is warm and dry.     Capillary Refill: Capillary refill takes less than 2 seconds.  Neurological:     General: No focal deficit present.     Mental Status: She is alert.     Motor: No weakness.  Psychiatric:  Mood and Affect: Mood normal.     Labs reviewed: Basic Metabolic Panel: Recent Labs    04/19/19  NA 145  K 4.3  BUN 17  CREATININE 0.9   Liver Function Tests: Recent Labs    04/19/19 0800  AST 22  ALT 12  ALKPHOS 64   No results for input(s): LIPASE, AMYLASE in the last 8760 hours. No results for input(s): AMMONIA in the last 8760 hours. CBC: Recent Labs    04/19/19  WBC 5.2  HGB 13.5  HCT 39  PLT 221   Lipid Panel: Recent Labs    04/19/19  CHOL 138  HDL 59  LDLCALC 59  TRIG 100   No results found for: HGBA1C  Procedures since last visit: No results found.  Assessment/Plan 1. Dementia of the Alzheimer's type, with late onset, with delusions ([place]) -continues on namenda XR and excelon -still struggling with pill taking at times and needs special instructions from Iona Beard to take them, typically he will eventually get them in her with encouragement -has caregivers only if he has appts -Iona Beard is not ready for her to move to memory care in health care or to have more regular help though he's even helping her eat now and dress  2. OAB (overactive bladder) -cont myrbetriq which has helped her  3. Pure hypercholesterolemia -continues on zocor therapy for this, check flp -loves desserts -? 5 year benefit there for this  4. Osteopenia of multiple sites -continues on vitamin D3, walks with her husband  5. Wandering behavior -less of an issue recently,has an id bracelet and Iona Beard has an alarm system to keep her from escaping the apt -security is aware of her  behavior  6. Primary osteoarthritis involving multiple joints -continues on glucosamine-chondroitin, no signs of pain   Labs/tests ordered:  Cbc, cmp, flp Next appt:  Visit date not found  Bryar Dahms L. Krystyne Tewksbury, D.O. [facility] [address], [place] [address] Cell Phone (Mon-Fri 8am-5pm):  [phone] On Call:  [phone] & follow prompts after 5pm & weekends Office Phone:  [phone] Office Fax:  [phone]

## 2019-10-13 DIAGNOSIS — Z23 Encounter for immunization: Secondary | ICD-10-CM | POA: Diagnosis not present

## 2019-10-20 ENCOUNTER — Telehealth: Payer: Self-pay

## 2019-10-20 NOTE — Telephone Encounter (Signed)
Form completed and placed to be faxed 10/22 evening.

## 2019-10-20 NOTE — Telephone Encounter (Signed)
Message left on clinical intake voicemail:   Karen Mclean with [employer] called to confirm form received, faxed last week.  I reviewed Dr.Reed's paperwork to review and form was received.  I called Karen Mclean to inform her form was received and we will return once reviewed and signed.

## 2019-10-24 ENCOUNTER — Ambulatory Visit (INDEPENDENT_AMBULATORY_CARE_PROVIDER_SITE_OTHER): Payer: Medicare Other | Admitting: Family

## 2019-10-24 ENCOUNTER — Encounter: Payer: Self-pay | Admitting: Family

## 2019-10-24 ENCOUNTER — Other Ambulatory Visit: Payer: Self-pay

## 2019-10-24 DIAGNOSIS — Z0000 Encounter for general adult medical examination without abnormal findings: Secondary | ICD-10-CM

## 2019-10-24 NOTE — Progress Notes (Signed)
This service is provided via telemedicine  No vital signs collected/recorded due to the encounter was a telemedicine visit.   Location of patient (ex: home, work):  Home  Patient consents to a telephone visit:  Yes  Location of the provider (ex: office, home):  Office   Name of any referring provider:  Hollace Kinnier, [place] of all persons participating in the telemedicine service and their role in the encounter: Marlowe Sax, NP, Ruthell Rummage CMA, and Gilda Crease and husband  Time spent on call:  Ruthell Rummage CMA, spent 12 minutes on phone with patient.

## 2019-10-24 NOTE — Patient Instructions (Addendum)
Karen Mclean , Thank you for taking time to come for your Medicare Wellness Visit. I appreciate your ongoing commitment to your health goals. Please review the following plan we discussed and let me know if I can assist you in the future.   Screening recommendations/referrals: Colonoscopy: Aged out  Mammogram: Up to date  Bone Density : Up to date  Recommended yearly ophthalmology/optometry visit for glaucoma screening and checkup Recommended yearly dental visit for hygiene and checkup  Vaccinations: Influenza vaccine: Up to date  Pneumococcal vaccine : Up to date  Tdap vaccine : Up to date  Shingles vaccine : Up to date    Advanced directives: Yes   Conditions/risks identified: Advanced Age female > 38 yrs,Dyslipidemia   Next appointment: 1 year   Preventive Care 25 Years and Older, Female Preventive care refers to lifestyle choices and visits with your health care provider that can promote health and wellness. What does preventive care include?  A yearly physical exam. This is also called an annual well check.  Dental exams once or twice a year.  Routine eye exams. Ask your health care provider how often you should have your eyes checked.  Personal lifestyle choices, including:  Daily care of your teeth and gums.  Regular physical activity.  Eating a healthy diet.  Avoiding tobacco and drug use.  Limiting alcohol use.  Practicing safe sex.  Taking low-dose aspirin every day.  Taking vitamin and mineral supplements as recommended by your health care provider. What happens during an annual well check? The services and screenings done by your health care provider during your annual well check will depend on your age, overall health, lifestyle risk factors, and family history of disease. Counseling  Your health care provider may ask you questions about your:  Alcohol use.  Tobacco use.  Drug use.  Emotional well-being.  Home and relationship well-being.   Sexual activity.  Eating habits.  History of falls.  Memory and ability to understand (cognition).  Work and work Statistician.  Reproductive health. Screening  You may have the following tests or measurements:  Height, weight, and BMI.  Blood pressure.  Lipid and cholesterol levels. These may be checked every 5 years, or more frequently if you are over 62 years old.  Skin check.  Lung cancer screening. You may have this screening every year starting at age 27 if you have a 30-pack-year history of smoking and currently smoke or have quit within the past 15 years.  Fecal occult blood test (FOBT) of the stool. You may have this test every year starting at age 9.  Flexible sigmoidoscopy or colonoscopy. You may have a sigmoidoscopy every 5 years or a colonoscopy every 10 years starting at age 92.  Hepatitis C blood test.  Hepatitis B blood test.  Sexually transmitted disease (STD) testing.  Diabetes screening. This is done by checking your blood sugar (glucose) after you have not eaten for a while (fasting). You may have this done every 1-3 years.  Bone density scan. This is done to screen for osteoporosis. You may have this done starting at age 41.  Mammogram. This may be done every 1-2 years. Talk to your health care provider about how often you should have regular mammograms. Talk with your health care provider about your test results, treatment options, and if necessary, the need for more tests. Vaccines  Your health care provider may recommend certain vaccines, such as:  Influenza vaccine. This is recommended every year.  Tetanus, diphtheria, and  acellular pertussis (Tdap, Td) vaccine. You may need a Td booster every 10 years.  Zoster vaccine. You may need this after age 50.  Pneumococcal 13-valent conjugate (PCV13) vaccine. One dose is recommended after age 74.  Pneumococcal polysaccharide (PPSV23) vaccine. One dose is recommended after age 27. Talk to your  health care provider about which screenings and vaccines you need and how often you need them. This information is not intended to replace advice given to you by your health care provider. Make sure you discuss any questions you have with your health care provider. Document Released: 01/11/2016 Document Revised: 09/03/2016 Document Reviewed: 10/16/2015 Elsevier Interactive Patient Education  2017 [employer] Prevention in the Home Falls can cause injuries. They can happen to people of all ages. There are many things you can do to make your home safe and to help prevent falls. What can I do on the outside of my home?  Regularly fix the edges of walkways and driveways and fix any cracks.  Remove anything that might make you trip as you walk through a door, such as a raised step or threshold.  Trim any bushes or trees on the path to your home.  Use bright outdoor lighting.  Clear any walking paths of anything that might make someone trip, such as rocks or tools.  Regularly check to see if handrails are loose or broken. Make sure that both sides of any steps have handrails.  Any raised decks and porches should have guardrails on the edges.  Have any leaves, snow, or ice cleared regularly.  Use sand or salt on walking paths during winter.  Clean up any spills in your garage right away. This includes oil or grease spills. What can I do in the bathroom?  Use night lights.  Install grab bars by the toilet and in the tub and shower. Do not use towel bars as grab bars.  Use non-skid mats or decals in the tub or shower.  If you need to sit down in the shower, use a plastic, non-slip stool.  Keep the floor dry. Clean up any water that spills on the floor as soon as it happens.  Remove soap buildup in the tub or shower regularly.  Attach bath mats securely with double-sided non-slip rug tape.  Do not have throw rugs and other things on the floor that can make you trip. What  can I do in the bedroom?  Use night lights.  Make sure that you have a light by your bed that is easy to reach.  Do not use any sheets or blankets that are too big for your bed. They should not hang down onto the floor.  Have a firm chair that has side arms. You can use this for support while you get dressed.  Do not have throw rugs and other things on the floor that can make you trip. What can I do in the kitchen?  Clean up any spills right away.  Avoid walking on wet floors.  Keep items that you use a lot in easy-to-reach places.  If you need to reach something above you, use a strong step stool that has a grab bar.  Keep electrical cords out of the way.  Do not use floor polish or wax that makes floors slippery. If you must use wax, use non-skid floor wax.  Do not have throw rugs and other things on the floor that can make you trip. What can I do with my stairs?  Do not leave any items on the stairs.  Make sure that there are handrails on both sides of the stairs and use them. Fix handrails that are broken or loose. Make sure that handrails are as long as the stairways.  Check any carpeting to make sure that it is firmly attached to the stairs. Fix any carpet that is loose or worn.  Avoid having throw rugs at the top or bottom of the stairs. If you do have throw rugs, attach them to the floor with carpet tape.  Make sure that you have a light switch at the top of the stairs and the bottom of the stairs. If you do not have them, ask someone to add them for you. What else can I do to help prevent falls?  Wear shoes that:  Do not have high heels.  Have rubber bottoms.  Are comfortable and fit you well.  Are closed at the toe. Do not wear sandals.  If you use a stepladder:  Make sure that it is fully opened. Do not climb a closed stepladder.  Make sure that both sides of the stepladder are locked into place.  Ask someone to hold it for you, if possible.   Clearly mark and make sure that you can see:  Any grab bars or handrails.  First and last steps.  Where the edge of each step is.  Use tools that help you move around (mobility aids) if they are needed. These include:  Canes.  Walkers.  Scooters.  Crutches.  Turn on the lights when you go into a dark area. Replace any light bulbs as soon as they burn out.  Set up your furniture so you have a clear path. Avoid moving your furniture around.  If any of your floors are uneven, fix them.  If there are any pets around you, be aware of where they are.  Review your medicines with your doctor. Some medicines can make you feel dizzy. This can increase your chance of falling. Ask your doctor what other things that you can do to help prevent falls. This information is not intended to replace advice given to you by your health care provider. Make sure you discuss any questions you have with your health care provider. Document Released: 10/11/2009 Document Revised: 05/22/2016 Document Reviewed: 01/19/2015 Elsevier Interactive Patient Education  2017 [employer].

## 2019-10-24 NOTE — Progress Notes (Signed)
Subjective:   Karen Mclean is a 85 y.o. female who presents for Medicare Annual (Subsequent) preventive examination.  Review of Systems:   Cardiac Risk Factors include: advanced age (>90men, >73 women);dyslipidemia     Objective:     Vitals: There were no vitals taken for this visit.  There is no height or weight on file to calculate BMI.  Advanced Directives 10/24/2019 04/27/2019 02/13/2019 07/06/2018 06/16/2018 06/30/2017 03/04/2017  Does Patient Have a Medical Advance Directive? Yes Yes No Yes Yes Yes Yes  Type of Paramedic of Organ;Living will;Out of facility DNR (pink MOST or yellow form) [place];Living will - [place];Living will [place];Living will [place];Living will [place];Living will  Does patient want to make changes to medical advance directive? No - Patient declined No - Patient declined - - No - Patient declined No - Patient declined -  Copy of [place] in Chart? Yes - validated most recent copy scanned in chart (See row information) Yes - validated most recent copy scanned in chart (See row information) - Yes Yes No - copy requested Yes  Would patient like information on creating a medical advance directive? - - No - Patient declined - - - -    Tobacco Social History   Tobacco Use  Smoking Status Never Smoker  Smokeless Tobacco Former Engineer, structural given: Not Answered   Clinical Intake:  Pre-visit preparation completed: No  Pain : No/denies pain  BMI - recorded: 22.65 Nutritional Status: BMI of 19-24  Normal Nutritional Risks: None Diabetes: No  How often do you need to have someone help you when you read instructions, pamphlets, or other written materials from your doctor or pharmacy?: 5 - Always What is the last grade level you completed in school?: BSi  Interpreter Needed?: No  Information entered  by :: [place] Jayce Boyko FNP-C  Past Medical History:  Diagnosis Date   Alzheimer's disease ([place])    High cholesterol    Uterine cancer ([place])    Past Surgical History:  Procedure Laterality Date   [facility]   cataract surgery Bilateral    Family History  Problem Relation Age of Onset   Suicidality Brother    Heart failure Brother    Leukemia Sister    Social History   Socioeconomic History   Marital status: Married    Spouse name: Not on file   Number of children: 2   Years of education: Not on file   Highest education level: Not on file  Occupational History   Occupation: Education officer, museum  Scientist, product/process development strain: Not hard at all   Food insecurity    Worry: Never true    Inability: Never true   Transportation needs    Medical: No    Non-medical: No  Tobacco Use   Smoking status: Never Smoker   Smokeless tobacco: Former Network engineer and Sexual Activity   Alcohol use: Not Currently    Alcohol/week: 2.0 - 3.0 standard drinks    Types: 1 Glasses of wine, 1 - 2 Standard drinks or equivalent per week    Comment: 1/2 glass of wine a night   Drug use: No   Sexual activity: Not on file  Lifestyle   Physical activity    Days per week: 5 days    Minutes per session: 30  min   Stress: Only a little  Relationships   Social connections    Talks on phone: More than three times a week    Gets together: More than three times a week    Attends religious service: More than 4 times per year    Active member of club or organization: No    Attends meetings of clubs or organizations: Never    Relationship status: Married  Other Topics Concern   Not on file  Social History Narrative   Diet? No      Do you drink/eat things with caffeine?  No      Marital status?     Married                               What year were you married?  1960      Do you live in a house, apartment, assisted living, condo,  trailer, etc.?  [place]      Is it one or more stories? One      How many persons live in your home? 2      Do you have any pets in your home? (please list) No      Current or past profession:  Retired Education officer, museum      Do you exercise?               yes                       Type & how often?  Walk daily      Do you have a living will? no      Do you have a DNR form?     no                             If not, do you want to discuss one? yes      Do you have signed POA/HPOA for forms? yes       Outpatient Encounter Medications as of 10/24/2019  Medication Sig   Cholecalciferol (VITAMIN D3) 2000 units TABS Take 1 tablet by mouth daily.   Glucosamine-Chondroit-Vit C-Mn (GLUCOSAMINE CHONDR 1500 COMPLX PO) Take 1 tablet by mouth 2 (two) times daily.   Melatonin 1 MG TABS Take by mouth at bedtime.   memantine (NAMENDA XR) 28 MG CP24 24 hr capsule Take 1 capsule (28 mg total) by mouth daily.   Multiple Vitamins-Minerals (MULTIVITAMIN WITH MINERALS) tablet Take 1 tablet by mouth daily.   MYRBETRIQ 50 MG TB24 tablet TAKE 1 TABLET DAILY   rivastigmine (EXELON) 3 MG capsule Take 1 capsule (3 mg total) by mouth 2 (two) times daily.   simvastatin (ZOCOR) 20 MG tablet TAKE 1 TABLET DAILY   No facility-administered encounter medications on file as of 10/24/2019.     Activities of Daily Living In your present state of health, do you have any difficulty performing the following activities: 10/24/2019  Hearing? N  Vision? N  Difficulty concentrating or making decisions? Y  Comment concentration and remembering  Walking or climbing stairs? N  Dressing or bathing? Y  Comment needs assist  Doing errands, shopping? Y  Comment Needs assistance  Preparing Food and eating ? Y  Comment needs assistance  Using the Toilet? N  In the past six months, have you accidently leaked urine? Y  Do you  have problems with loss of bowel control? N  Managing your Medications? Y   Comment Needs assistance  Managing your Finances? Y  Housekeeping or managing your Housekeeping? Y  Comment needs assistance  Some recent data might be hidden    Patient Care Team: Gayland Curry, DO as PCP - General (Geriatric Medicine) Addison Lank, MD as Consulting Physician (Dermatology)    Assessment:   This is a routine wellness examination for [place].  Exercise Activities and Dietary recommendations Current Exercise Habits: Home exercise routine, Type of exercise: stretching, Time (Minutes): 25, Frequency (Times/Week): 2, Weekly Exercise (Minutes/Week): 50, Intensity: Mild, Exercise limited by: Other - see comments(cognitive impairment)  Goals     Maintain Lifestyle       Fall Risk Fall Risk  10/24/2019 08/24/2019 04/27/2019 02/23/2019 10/13/2018  Falls in the past year? 1 0 1 1 No  Number falls in past yr: 0 0 0 0 -  Injury with Fall? 1 0 1 1 -  Risk for fall due to : - - - History of fall(s);Impaired balance/gait;Impaired mobility;Mental status change;Medication side effect -  Follow up - - - Falls evaluation completed;Education provided;Falls prevention discussed -  Comment - - - PT referral -   Is the patient's home free of loose throw rugs in walkways, pet beds, electrical cords, etc?   no      Grab bars in the bathroom? yes      Handrails on the stairs?   no      Adequate lighting?   yes  Depression Screen PHQ 2/9 Scores 10/24/2019 08/24/2019 04/27/2019 02/23/2019  PHQ - 2 Score 0 0 0 0     Cognitive Function MMSE - Mini Mental State Exam 09/03/2018 02/23/2018 08/24/2017 02/23/2017 02/23/2017  Orientation to time 0 0 1 0 0  Orientation to Place 1 1 4 3 3   Registration 3 3 3 3 3   Attention/ Calculation 0 0 0 0 0  Recall 0 0 0 0 0  Language- name 2 objects 1 1 2 2 2   Language- repeat 1 1 1 1 1   Language- follow 3 step command 3 2 3 3 3   Language- read & follow direction 1 1 1 1 1   Write a sentence 1 1 1 1 1   Copy design 0 0 0 1 1  Total score 11 10 16 15 15       6CIT Screen 10/24/2019  What Year? (No Data)  What month? (No Data)  What time? (No Data)  Count back from 20 (No Data)  Months in reverse 4 points  Repeat phrase (No Data)    Immunization History  Administered Date(s) Administered   Influenza, High Dose Seasonal PF 10/07/2019   Influenza,inj,Quad PF,6+ Mos 10/22/2018   Influenza-Unspecified 10/31/2015, 10/23/2016, 10/19/2017   Pneumococcal Conjugate-13 06/16/2018   Pneumococcal Polysaccharide-23 02/04/2012   Tdap 10/16/2018   Zoster Recombinat (Shingrix) 07/10/2017, 11/13/2017, 06/27/2018    Qualifies for Shingles Vaccine? Up to date   Screening Tests Health Maintenance  Topic Date Due   TETANUS/TDAP  10/16/2028   INFLUENZA VACCINE  Completed   DEXA SCAN  Completed   PNA vac Low Risk Adult  Completed    Cancer Screenings: Lung: Low Dose CT Chest recommended if Age 21-80 years, 30 pack-year currently smoking OR have quit w/in 15years. Patient does not qualify. Breast:  Up to date on Mammogram? Yes   Up to date of Bone Density/Dexa? Yes Colorectal: Age Out   Additional Screenings:  Hepatitis C Screening: Low  risk   Plan:   I have personally reviewed and noted the following in the patients chart:    Medical and social history  Use of alcohol, tobacco or illicit drugs   Current medications and supplements  Functional ability and status  Nutritional status  Physical activity  Advanced directives  List of other physicians  Hospitalizations, surgeries, and ER visits in previous 12 months  Vitals  Screenings to include cognitive, depression, and falls  Referrals and appointments  In addition, I have reviewed and discussed with patient certain preventive protocols, quality metrics, and best practice recommendations. A written personalized care plan for preventive services as well as general preventive health recommendations were provided to patient.  Sandrea Hughs,  NP  10/24/2019

## 2019-10-28 DIAGNOSIS — Z20828 Contact with and (suspected) exposure to other viral communicable diseases: Secondary | ICD-10-CM | POA: Diagnosis not present

## 2020-01-03 DIAGNOSIS — Z79899 Other long term (current) drug therapy: Secondary | ICD-10-CM | POA: Diagnosis not present

## 2020-01-03 DIAGNOSIS — G309 Alzheimer's disease, unspecified: Secondary | ICD-10-CM | POA: Diagnosis not present

## 2020-01-03 DIAGNOSIS — D649 Anemia, unspecified: Secondary | ICD-10-CM | POA: Diagnosis not present

## 2020-01-03 DIAGNOSIS — E785 Hyperlipidemia, unspecified: Secondary | ICD-10-CM | POA: Diagnosis not present

## 2020-01-03 DIAGNOSIS — M8580 Other specified disorders of bone density and structure, unspecified site: Secondary | ICD-10-CM | POA: Diagnosis not present

## 2020-01-04 IMAGING — CR DG WRIST COMPLETE 3+V*R*
4 series · 4 of 4 positions shown · non-contrast
Comparison: None.

CLINICAL DATA: Pain after fall

EXAM:
RIGHT WRIST - COMPLETE 3+ VIEW

[wrist pa]
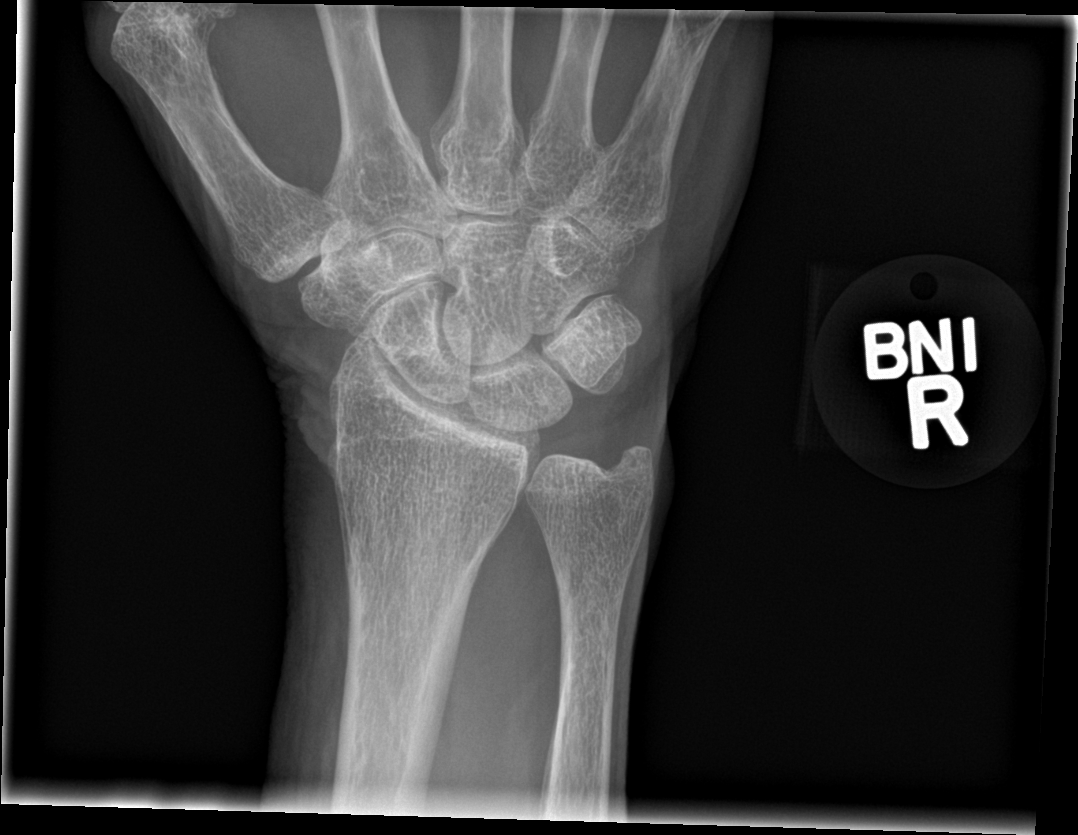

[wrist obl]
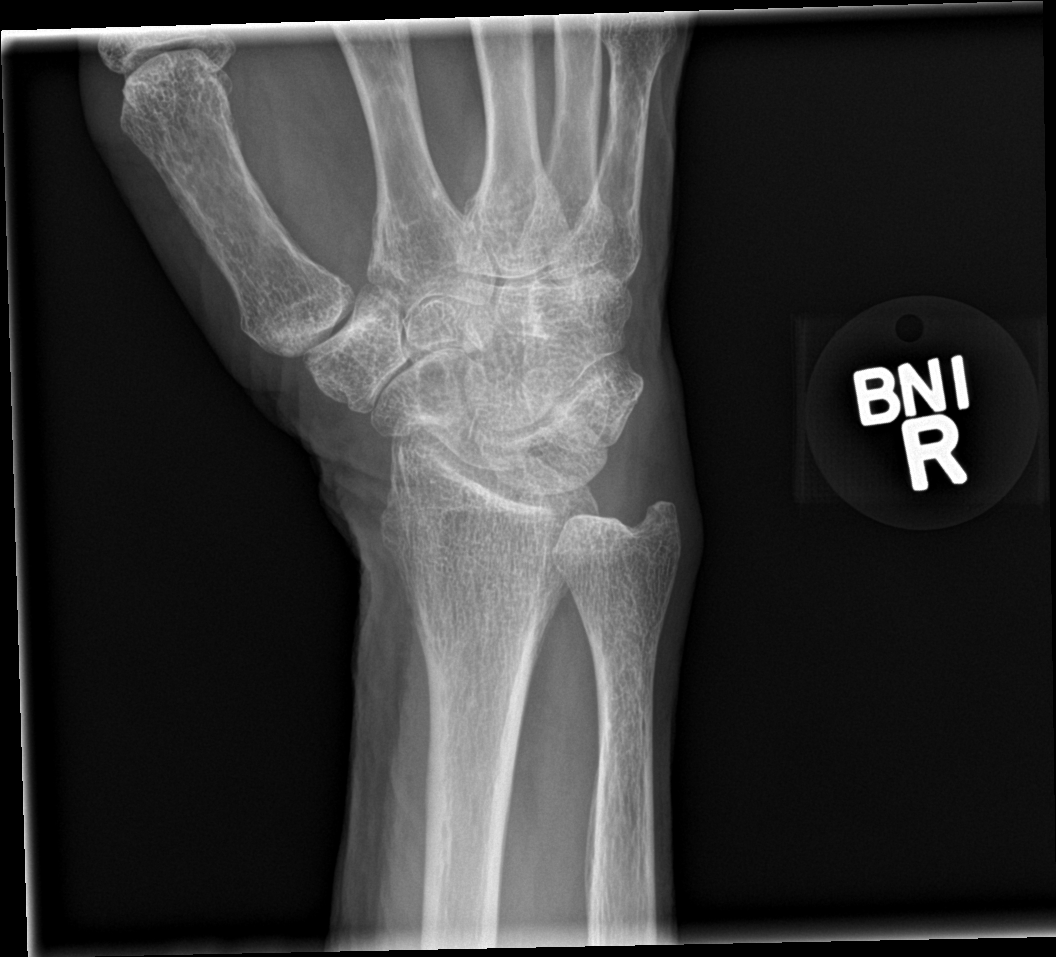

[wrist lat]
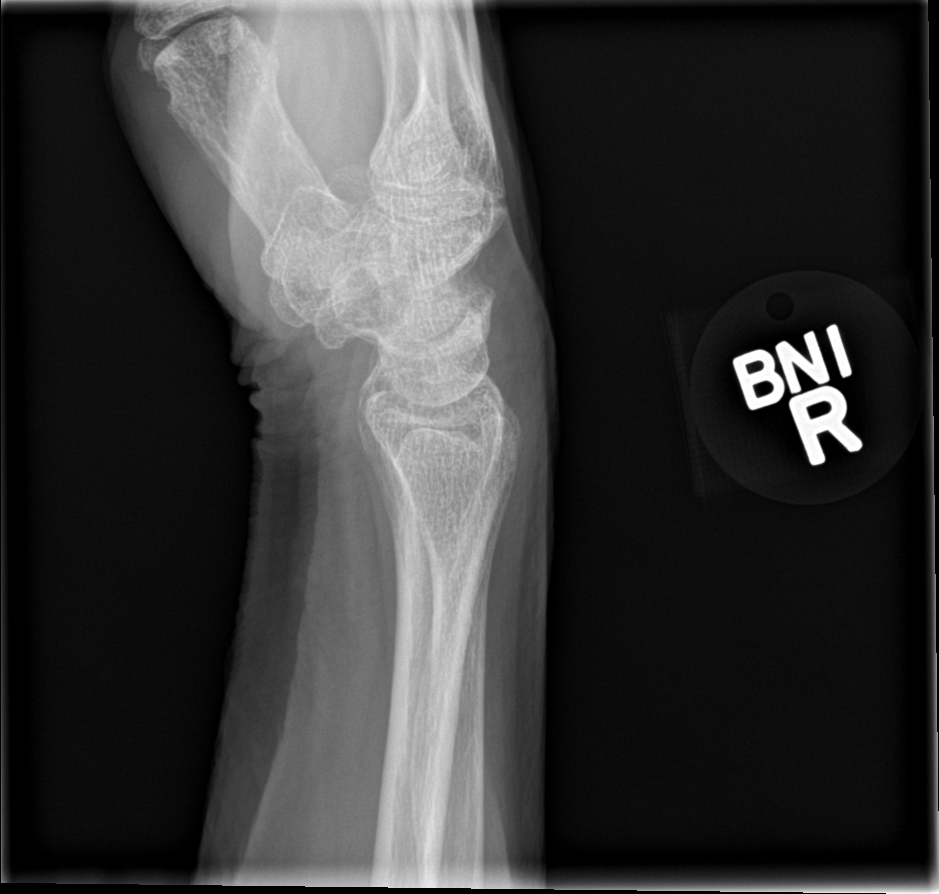

[wrist navicular]
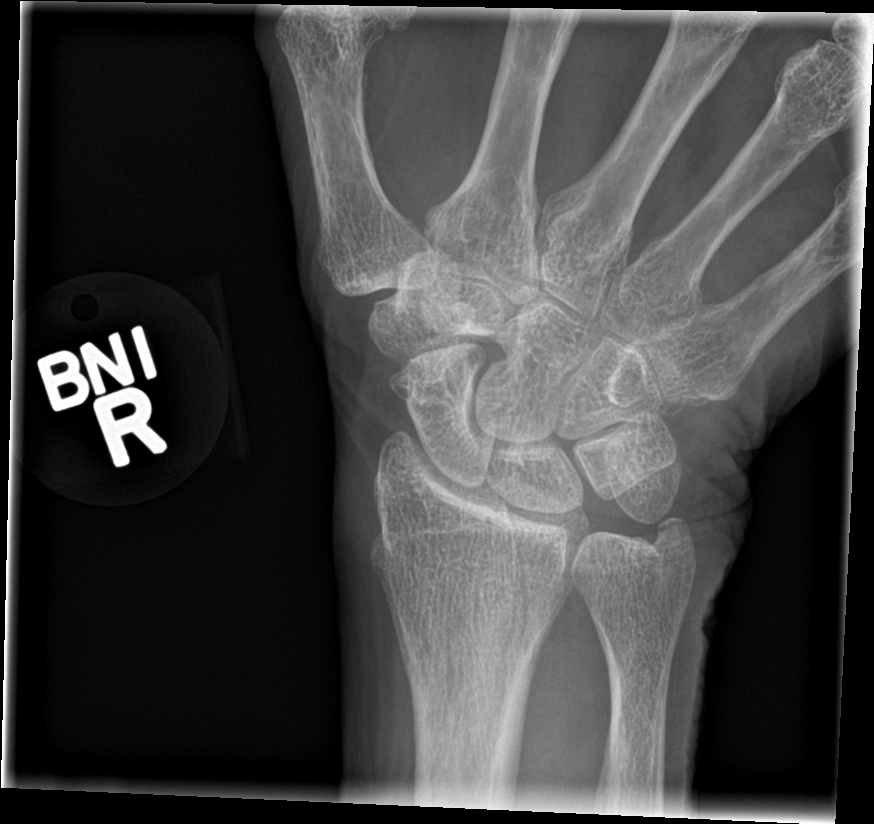

[4 of 4 positions shown; findings below may reference images not displayed]

FINDINGS: There is no evidence of fracture or dislocation. There is no
evidence of arthropathy or other focal bone abnormality. Soft
tissues are unremarkable.
IMPRESSION: Negative.

## 2020-01-04 IMAGING — CR DG CHEST 1V
1 series · 1 of 1 positions shown · non-contrast
Comparison: None.

CLINICAL DATA: Pain after fall

EXAM:
CHEST  1 VIEW

[chest ap]
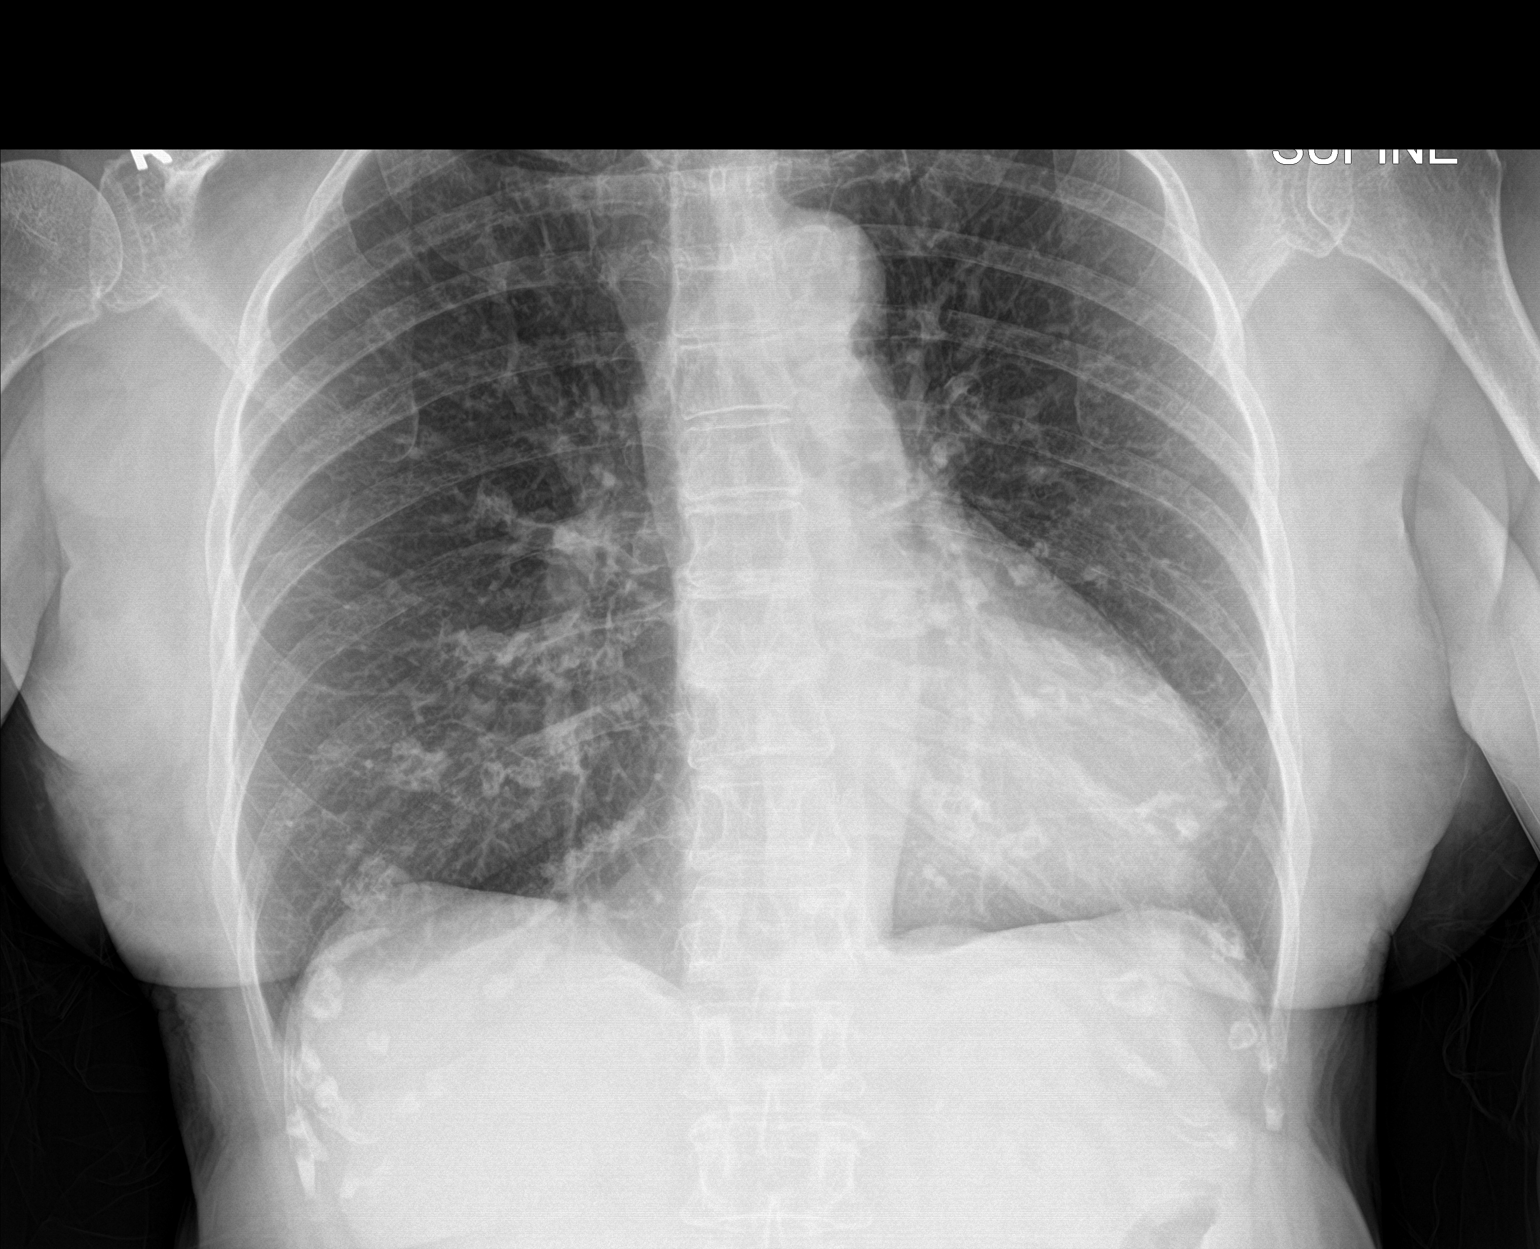

[1 of 1 positions shown; findings below may reference images not displayed]

FINDINGS: The heart size and mediastinal contours are within normal limits.
Both lungs are clear. The visualized skeletal structures are
unremarkable.
IMPRESSION: No active disease.

## 2020-01-04 IMAGING — CR DG HAND COMPLETE 3+V*R*
3 series · 3 of 3 positions shown · non-contrast
Comparison: None.

CLINICAL DATA: Pain after fall

EXAM:
RIGHT HAND - COMPLETE 3+ VIEW

[hand pa]
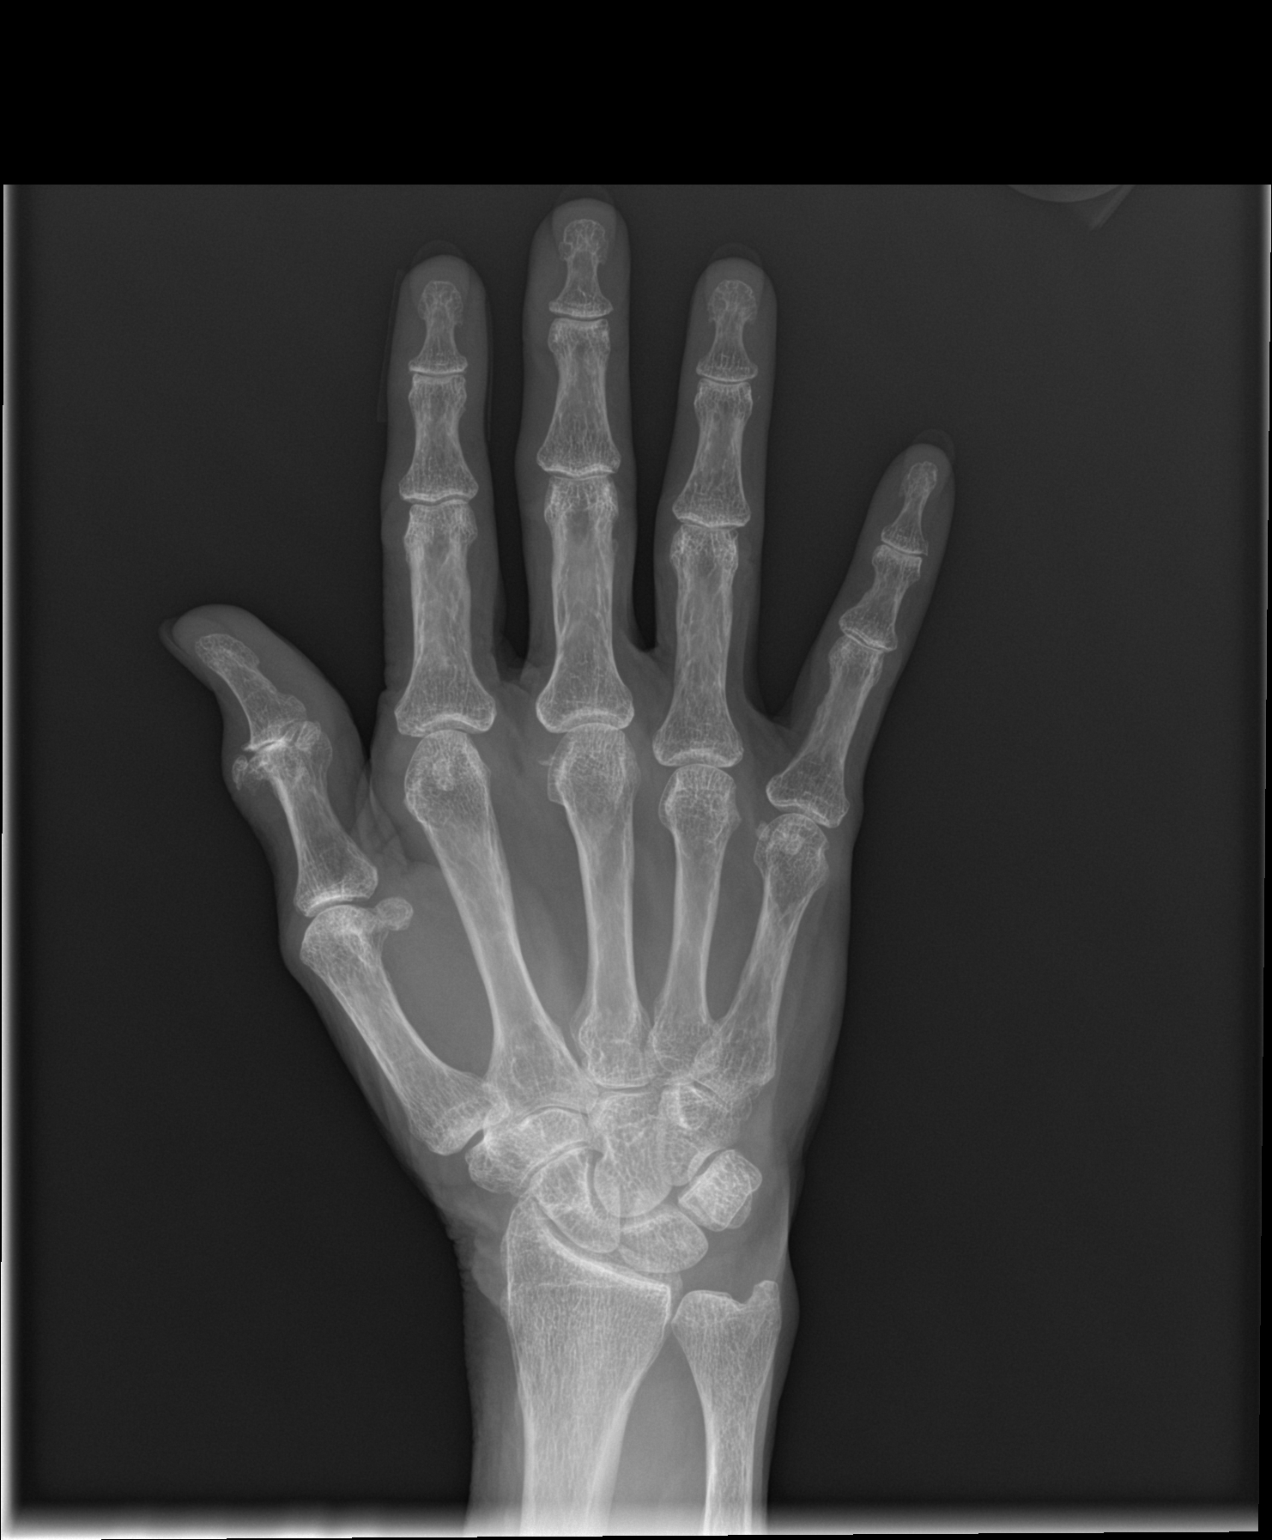

[hand obl]
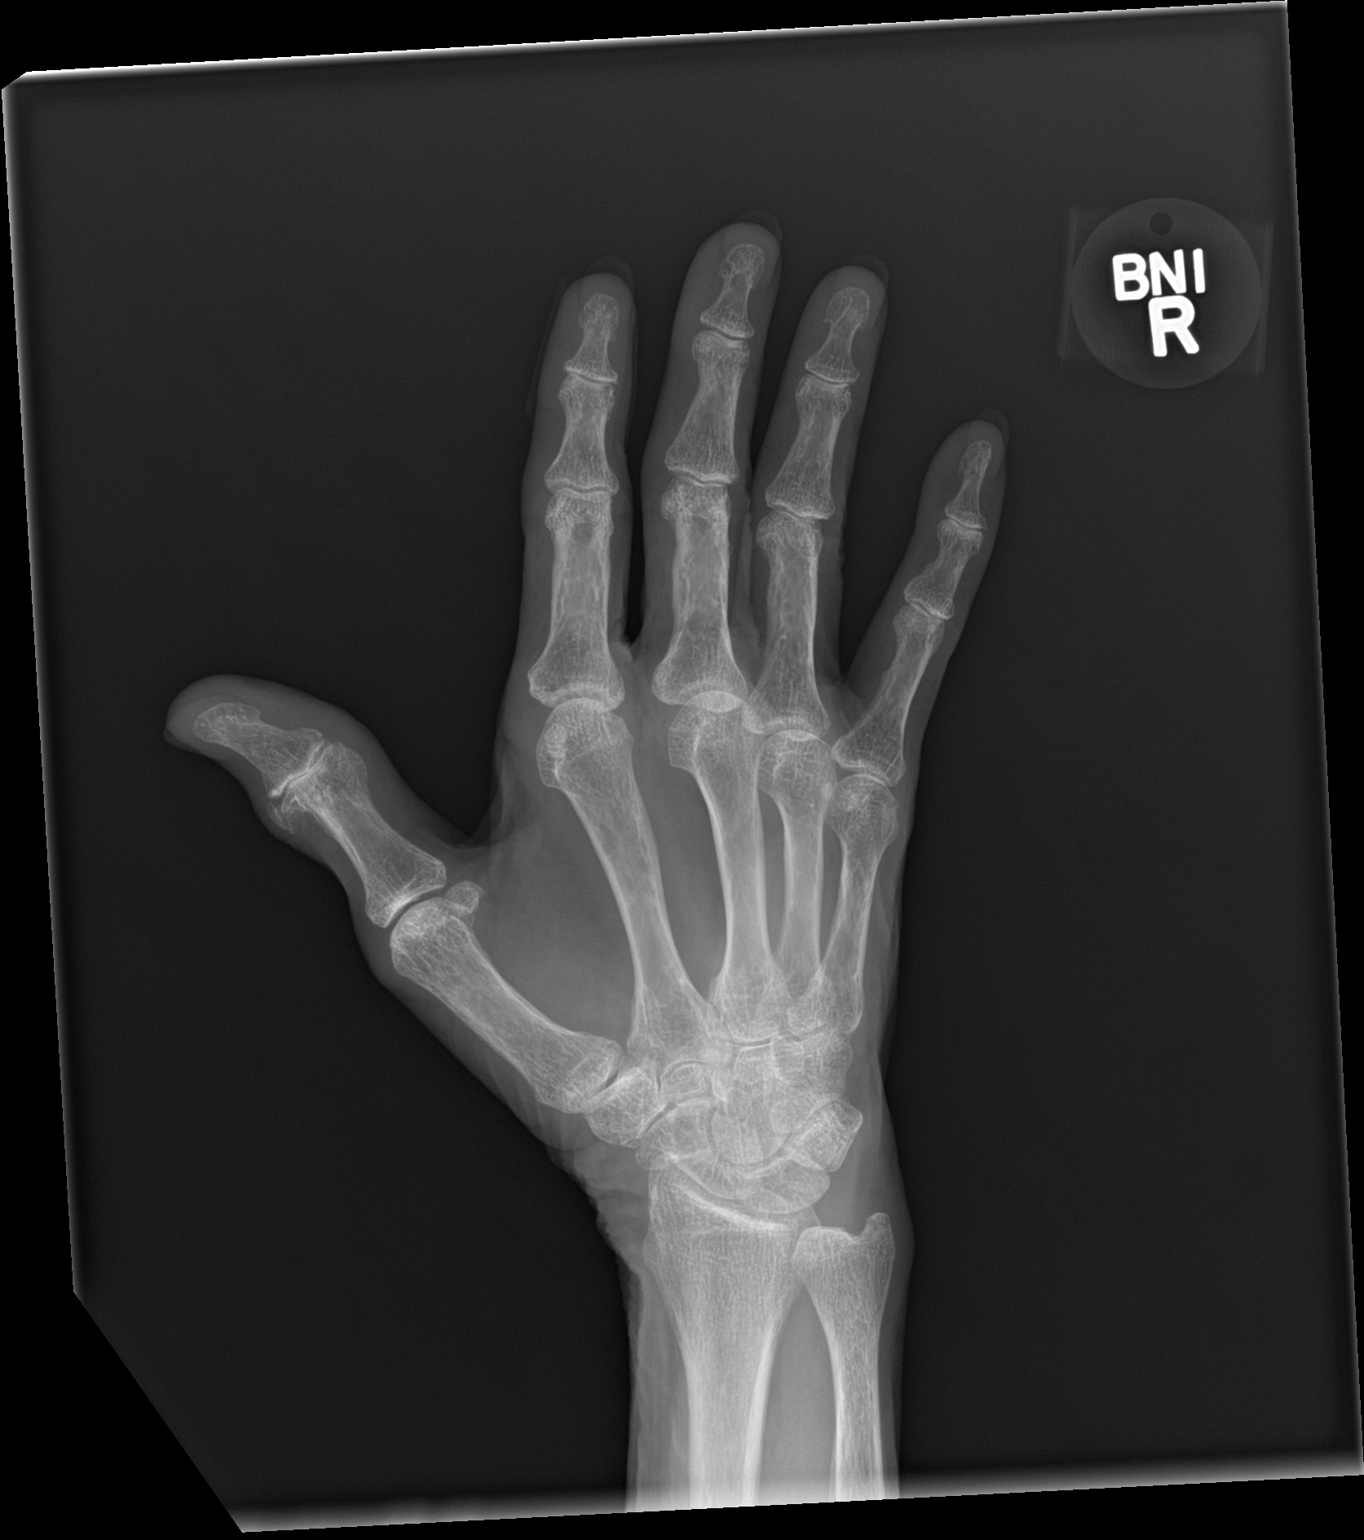

[hand lat]
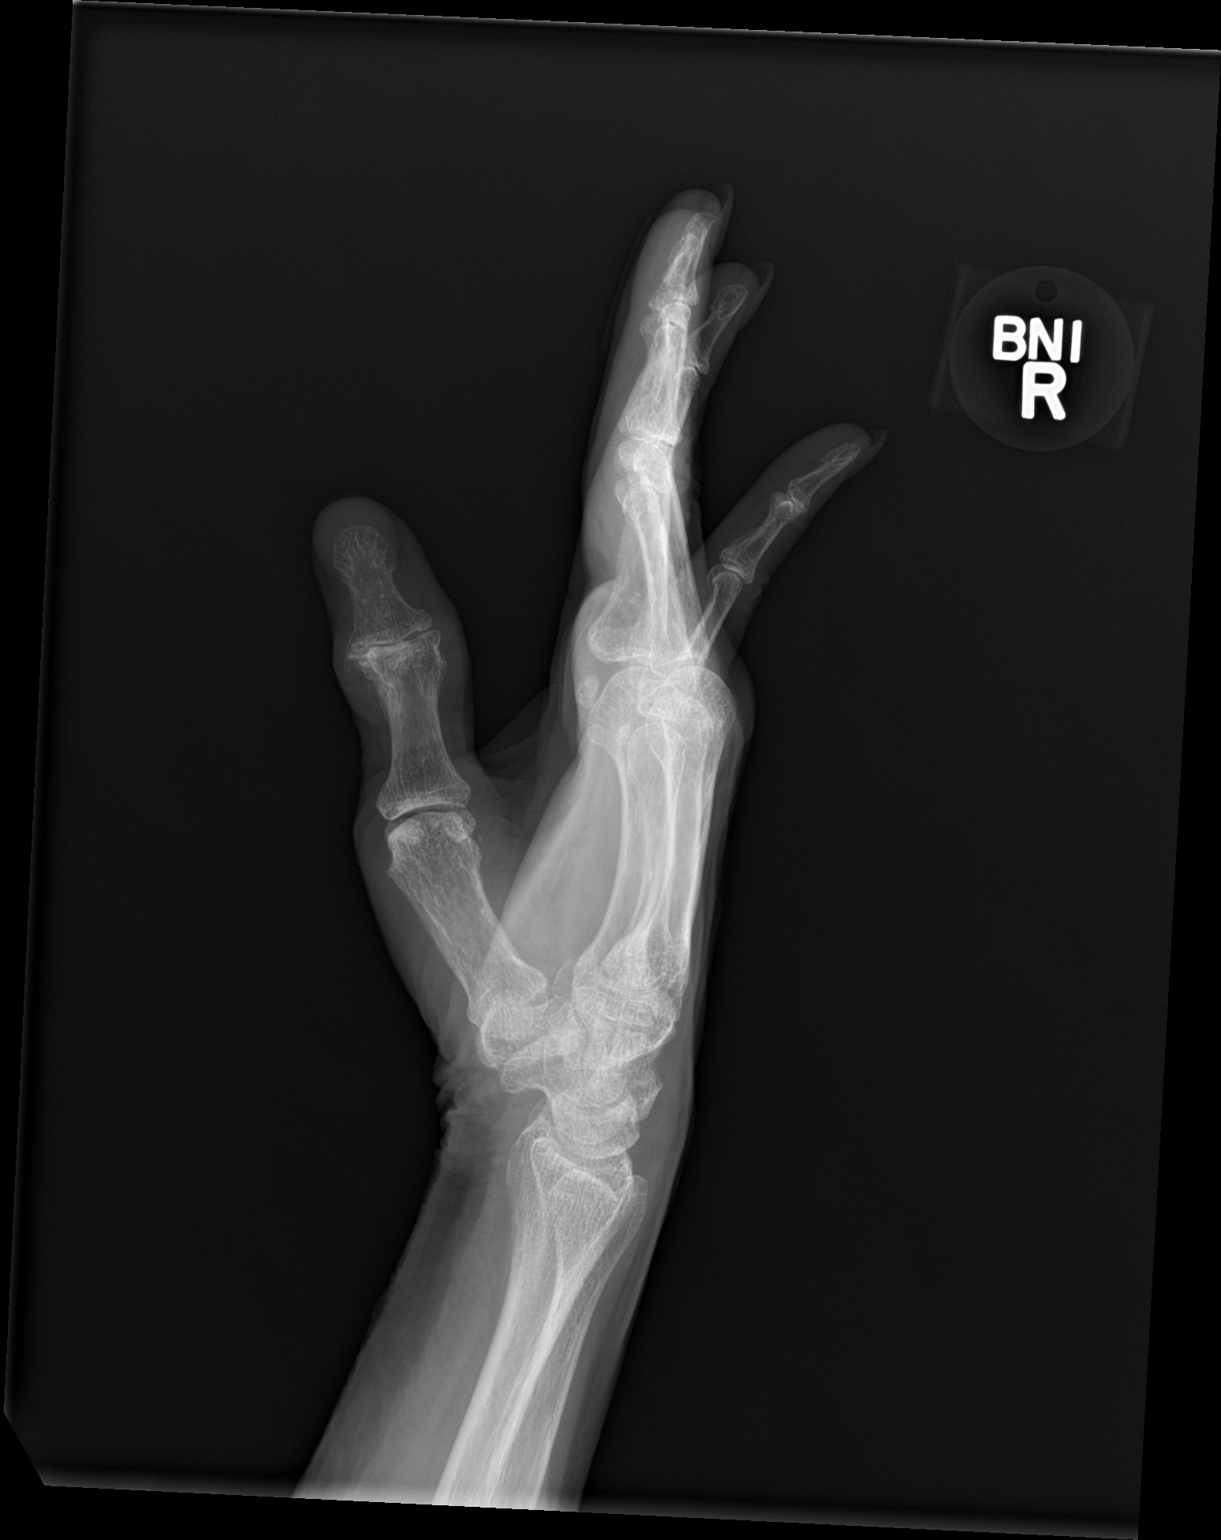

[3 of 3 positions shown; findings below may reference images not displayed]

FINDINGS: Degenerative changes primarily in the thumb. No fracture
dislocation.
IMPRESSION: Degenerative changes in the thumb.  No fracture or dislocation.

## 2020-01-09 DIAGNOSIS — R630 Anorexia: Secondary | ICD-10-CM | POA: Diagnosis not present

## 2020-01-09 DIAGNOSIS — D649 Anemia, unspecified: Secondary | ICD-10-CM | POA: Diagnosis not present

## 2020-01-09 LAB — BASIC METABOLIC PANEL
BUN: 17 (ref 4–21)
CO2: 29 — AB (ref 13–22)
Chloride: 102 (ref 99–108)
Creatinine: 0.9 (ref 0.5–1.1)
Glucose: 90
Potassium: 4.3 (ref 3.4–5.3)
Sodium: 140 (ref 137–147)

## 2020-01-09 LAB — CBC AND DIFFERENTIAL
HCT: 36 (ref 36–46)
Hemoglobin: 12.3 (ref 12.0–16.0)
Platelets: 256 (ref 150–399)
WBC: 4.9

## 2020-01-09 LAB — CBC: RBC: 3.88 (ref 3.87–5.11)

## 2020-01-09 LAB — COMPREHENSIVE METABOLIC PANEL: Calcium: 9.3 (ref 8.7–10.7)

## 2020-01-10 ENCOUNTER — Encounter: Payer: Self-pay | Admitting: Internal Medicine

## 2020-01-10 ENCOUNTER — Non-Acute Institutional Stay (SKILLED_NURSING_FACILITY): Payer: Medicare Other | Admitting: Internal Medicine

## 2020-01-10 DIAGNOSIS — G301 Alzheimer's disease with late onset: Secondary | ICD-10-CM | POA: Diagnosis not present

## 2020-01-10 DIAGNOSIS — M8949 Other hypertrophic osteoarthropathy, multiple sites: Secondary | ICD-10-CM

## 2020-01-10 DIAGNOSIS — Z66 Do not resuscitate: Secondary | ICD-10-CM

## 2020-01-10 DIAGNOSIS — U071 COVID-19: Secondary | ICD-10-CM | POA: Diagnosis not present

## 2020-01-10 DIAGNOSIS — N3281 Overactive bladder: Secondary | ICD-10-CM | POA: Diagnosis not present

## 2020-01-10 DIAGNOSIS — E7800 Pure hypercholesterolemia, unspecified: Secondary | ICD-10-CM

## 2020-01-10 DIAGNOSIS — F0281 Dementia in other diseases classified elsewhere with behavioral disturbance: Secondary | ICD-10-CM | POA: Diagnosis not present

## 2020-01-10 DIAGNOSIS — F22 Delusional disorders: Secondary | ICD-10-CM | POA: Diagnosis not present

## 2020-01-10 DIAGNOSIS — M8589 Other specified disorders of bone density and structure, multiple sites: Secondary | ICD-10-CM | POA: Diagnosis not present

## 2020-01-10 DIAGNOSIS — R4689 Other symptoms and signs involving appearance and behavior: Secondary | ICD-10-CM | POA: Diagnosis not present

## 2020-01-10 DIAGNOSIS — F02818 Dementia in other diseases classified elsewhere, unspecified severity, with other behavioral disturbance: Secondary | ICD-10-CM

## 2020-01-10 DIAGNOSIS — M159 Polyosteoarthritis, unspecified: Secondary | ICD-10-CM

## 2020-01-10 NOTE — Progress Notes (Signed)
Patient ID: Karen Mclean, female   DOB: [birth year], 86 y.o.   MRN: [id number]  Provider:  Rexene Edison. Mariea Clonts, D.O., C.M.D. Location:  [facility] Room Number: 160A Place of Service:  SNF (31)  PCP: Gayland Curry, DO Patient Care Team: Gayland Curry, DO as PCP - General (Geriatric Medicine) Addison Lank, MD as Consulting Physician (Dermatology)  Extended Emergency Contact Information Primary Emergency Contact: Mclean,Karen Address: [address] [place], [place] [address] [name] Phone: [phone] Relation: Son  Code Status: DNR Goals of Care: Advanced Directive information Advanced Directives 10/24/2019  Does Patient Have a Medical Advance Directive? Yes  Type of Paramedic of [place];Living will;Out of facility DNR (pink MOST or yellow form)  Does patient want to make changes to medical advance directive? No - Patient declined  Copy of Andrew in Chart? Yes - validated most recent copy scanned in chart (See row information)  Would patient like information on creating a medical advance directive? -   Chief Complaint  Patient presents with  . rehab admission    Covid 19    HPI: Patient is a 86 y.o. female seen today for admission to [facility] covid unit due to new diagnosis of covid-19.  [name] tested positive at the memory center.  She was not eating well and her husband was also positive so they initially both came over here to rehab.  She is still not eating well, but not having fever, chills, does not feel short of breath, not coughing during our visit.  She's unable to tell us about change in taste or smell.  She has not eaten a lot for at least a year.    Past Medical History:  Diagnosis Date  . Alzheimer's disease ([place])   . High cholesterol   . Uterine cancer [facility])    Past Surgical History:  Procedure Laterality Date  . [facility]  . cataract surgery Bilateral     reports that she has never smoked. She has quit using smokeless tobacco. She reports previous alcohol use of about 2.0 - 3.0 standard drinks of alcohol per week. She reports that she does not use drugs. Social History   Socioeconomic History  . Marital status: Married    Spouse name: Not on file  . Number of children: 2  . Years of education: Not on file  . Highest education level: Not on file  Occupational History  . Occupation: Education officer, museum  Tobacco Use  . Smoking status: Never Smoker  . Smokeless tobacco: Former Network engineer and Sexual Activity  . Alcohol use: Not Currently    Alcohol/week: 2.0 - 3.0 standard drinks    Types: 1 Glasses of wine, 1 - 2 Standard drinks or equivalent per week    Comment: 1/2 glass of wine a night  . Drug use: No  . Sexual activity: Not on file  Other Topics Concern  . Not on file  Social History Narrative   Diet? No      Do you drink/eat things with caffeine?  No      Marital status?     Married                               What year were you married?  1960      Do you live in a house, apartment, assisted living, condo, trailer, etc.?  [place]      Is it one or more stories? One      How many persons live in your home? 2      Do you have any pets in your home? (please list) No      Current or past profession:  Retired Education officer, museum      Do you exercise?               yes                       Type & how often?  Walk daily      Do you have a living will? no      Do you have a DNR form?     no                             If not, do you want to discuss one? yes      Do you have signed POA/HPOA for forms? yes      Social Determinants of Health   Financial Resource Strain:   . Difficulty of Paying Living Expenses: Not on file  Food Insecurity:   . Worried About Charity fundraiser in the Last Year: Not on file  . Ran Out of Food in the  Last Year: Not on file  Transportation Needs:   . Lack of Transportation (Medical): Not on file  . Lack of Transportation (Non-Medical): Not on file  Physical Activity:   . Days of Exercise per Week: Not on file  . Minutes of Exercise per Session: Not on file  Stress:   . Feeling of Stress : Not on file  Social Connections:   . Frequency of Communication with Friends and Family: Not on file  . Frequency of Social Gatherings with Friends and Family: Not on file  . Attends Religious Services: Not on file  . Active Member of Clubs or Organizations: Not on file  . Attends Archivist Meetings: Not on file  . Marital Status: Not on file  Intimate Partner Violence:   . Fear of Current or Ex-Partner: Not on file  . Emotionally Abused: Not on file  . Physically Abused: Not on file  . Sexually Abused: Not on file    Functional Status Survey:    Family History  Problem Relation Age of Onset  . Suicidality Brother   . Heart failure Brother   . Leukemia Sister     Health Maintenance  Topic Date Due  . Samul Dada  10/16/2028  . INFLUENZA VACCINE  Completed  . DEXA SCAN  Completed  . PNA vac Low Risk Adult  Completed    Allergies  Allergen Reactions  . Penicillins Hives  . Sulfa Antibiotics Hives    Outpatient Encounter Medications as of 01/10/2020  Medication Sig  . Cholecalciferol (VITAMIN D3) 2000 units TABS Take 1 tablet by mouth daily.  . Glucosamine-Chondroit-Vit C-Mn (GLUCOSAMINE CHONDR 1500 COMPLX PO) Take 1 tablet by mouth 2 (two) times daily.  . Melatonin 1 MG TABS Take by mouth at bedtime.  . memantine (NAMENDA XR) 28 MG CP24 24 hr capsule Take 1 capsule (28 mg total) by mouth daily.  . Multiple Vitamins-Minerals (MULTIVITAMIN WITH MINERALS) tablet Take 1 tablet by mouth daily.  Marland Kitchen MYRBETRIQ 50  MG TB24 tablet TAKE 1 TABLET DAILY  . rivastigmine (EXELON) 3 MG capsule Take 1 capsule (3 mg total) by mouth 2 (two) times daily.  . simvastatin (ZOCOR) 20 MG  tablet TAKE 1 TABLET DAILY   No facility-administered encounter medications on file as of 01/10/2020.    Review of Systems  Constitutional: Positive for appetite change. Negative for activity change, chills, fever and unexpected weight change.  HENT: Negative for congestion and sore throat.   Eyes: Negative for visual disturbance.  Respiratory: Positive for cough. Negative for chest tightness and shortness of breath.        Had some cough but not witnessed by me  Cardiovascular: Negative for chest pain, palpitations and leg swelling.  Gastrointestinal: Negative for abdominal pain, diarrhea, nausea and vomiting.  Genitourinary: Negative for dysuria.  Musculoskeletal: Positive for gait problem.       Usually holds husband's hand when walking  Skin: Positive for pallor.    Vitals:   01/10/20 1320  BP: (!) 115/59  Pulse: 62  Temp: 98.1 F (36.7 C)  TempSrc: Oral  SpO2: 94%  Weight: 132 lb (59.9 kg)  Height: 5\' 1"  (1.549 m)   Body mass index is 24.94 kg/m. Physical Exam Constitutional:      General: She is not in acute distress.    Appearance: She is not ill-appearing or toxic-appearing.  HENT:     Head: Normocephalic and atraumatic.     Right Ear: External ear normal.     Left Ear: External ear normal.     Nose: Nose normal.     Mouth/Throat:     Pharynx: Oropharynx is clear.  Eyes:     Extraocular Movements: Extraocular movements intact.     Pupils: Pupils are equal, round, and reactive to light.  Cardiovascular:     Rate and Rhythm: Normal rate and regular rhythm.     Pulses: Normal pulses.     Heart sounds: Normal heart sounds.  Pulmonary:     Effort: Pulmonary effort is normal.     Breath sounds: Normal breath sounds. No wheezing, rhonchi or rales.  Abdominal:     General: Bowel sounds are normal.     Palpations: Abdomen is soft.  Musculoskeletal:        General: Normal range of motion.     Right lower leg: No edema.     Left lower leg: No edema.  Skin:     General: Skin is warm and dry.     Capillary Refill: Capillary refill takes less than 2 seconds.     Coloration: Skin is pale.  Neurological:     General: No focal deficit present.     Mental Status: She is alert.     Cranial Nerves: No cranial nerve deficit.     Motor: Weakness present.     Gait: Gait abnormal.  Psychiatric:        Mood and Affect: Mood normal.     Labs reviewed: Basic Metabolic Panel: Recent Labs    04/19/19 0000 01/09/20 0000  NA 145 140  K 4.3 4.3  CL  --  102  CO2  --  29*  BUN 17 17  CREATININE 0.9 0.9  CALCIUM  --  9.3   Liver Function Tests: Recent Labs    04/19/19 0800  AST 22  ALT 12  ALKPHOS 64   No results for input(s): LIPASE, AMYLASE in the last 8760 hours. No results for input(s): AMMONIA in the last 8760 hours. CBC:  Recent Labs    04/19/19 0000 01/09/20 0000  WBC 5.2 4.9  HGB 13.5 12.3  HCT 39 36  PLT 221 256   Cardiac Enzymes: No results for input(s): CKTOTAL, CKMB, CKMBINDEX, TROPONINI in the last 8760 hours. BNP: Invalid input(s): POCBNP No results found for: HGBA1C Lab Results  Component Value Date   TSH 3.70 02/24/2017   Lab Results  Component Value Date   [id number] 02/24/2017   No results found for: FOLATE No results found for: IRON, TIBC, FERRITIN  Imaging and Procedures obtained prior to SNF admission: DG Chest 1 View  Result Date: 02/13/2019 CLINICAL DATA:  Pain after fall EXAM: CHEST  1 VIEW COMPARISON:  None. FINDINGS: The heart size and mediastinal contours are within normal limits. Both lungs are clear. The visualized skeletal structures are unremarkable. IMPRESSION: No active disease. Electronically Signed   By: Dorise Bullion III M.D   On: 02/13/2019 11:55   DG Wrist Complete Left  Result Date: 02/13/2019 CLINICAL DATA:  Fall.  Pain. EXAM: LEFT WRIST - COMPLETE 3+ VIEW COMPARISON:  None. FINDINGS: There is a mildly unusual configuration of the scaphoid but the configuration is similar to the  right. No convincing evidence of scaphoid fracture. No other fractures identified. IMPRESSION: There is a mildly unusual configuration to the scaphoid which is similar on the left to the right and probably normal for this patient. Recommend clinical correlation to exclude snuffbox tenderness. No convincing evidence of fracture on this study. Electronically Signed   By: Dorise Bullion III M.D   On: 02/13/2019 11:47   DG Wrist Complete Right  Result Date: 02/13/2019 CLINICAL DATA:  Pain after fall EXAM: RIGHT WRIST - COMPLETE 3+ VIEW COMPARISON:  None. FINDINGS: There is no evidence of fracture or dislocation. There is no evidence of arthropathy or other focal bone abnormality. Soft tissues are unremarkable. IMPRESSION: Negative. Electronically Signed   By: Dorise Bullion III M.D   On: 02/13/2019 11:52   CT Head Wo Contrast  Result Date: 02/13/2019 CLINICAL DATA:  [place] to the sidewalk. Trauma to the head, face and neck. EXAM: CT HEAD WITHOUT CONTRAST CT MAXILLOFACIAL WITHOUT CONTRAST CT CERVICAL SPINE WITHOUT CONTRAST TECHNIQUE: Multidetector CT imaging of the head, cervical spine, and maxillofacial structures were performed using the standard protocol without intravenous contrast. Multiplanar CT image reconstructions of the cervical spine and maxillofacial structures were also generated. COMPARISON:  None. FINDINGS: CT HEAD FINDINGS Brain: Age related atrophy. Chronic small-vessel ischemic changes throughout the white matter. No sign of acute infarction, mass lesion, hemorrhage, hydrocephalus or extra-axial collection. Vascular: There is atherosclerotic calcification of the major vessels at the base of the brain. Skull: Negative Other: None CT MAXILLOFACIAL FINDINGS Osseous: No evidence of facial fracture. Orbits: No evidence of orbital injury. Sinuses: Clear Soft tissues: No significant soft tissue finding by CT. CT CERVICAL SPINE FINDINGS Alignment: Normal Skull base and vertebrae: No fracture. Superior  endplate Schmorl's nodes at C7. Soft tissues and spinal canal: Negative Disc levels: Ordinary spondylosis and facet osteoarthritis. No apparent bony canal stenosis or significant foraminal narrowing. Upper chest: Mild scarring.  No active process. Other: None IMPRESSION: Head CT: No acute or traumatic finding. Atrophy and chronic small-vessel ischemic changes. Maxillofacial CT: No acute or traumatic finding. Cervical spine CT: No acute or traumatic finding. Ordinary mild degenerative changes fairly typical for age. Electronically Signed   By: Nelson Chimes M.D.   On: 02/13/2019 11:21   CT Cervical Spine Wo Contrast  Result Date: 02/13/2019 CLINICAL  DATA:  [place] to the sidewalk. Trauma to the head, face and neck. EXAM: CT HEAD WITHOUT CONTRAST CT MAXILLOFACIAL WITHOUT CONTRAST CT CERVICAL SPINE WITHOUT CONTRAST TECHNIQUE: Multidetector CT imaging of the head, cervical spine, and maxillofacial structures were performed using the standard protocol without intravenous contrast. Multiplanar CT image reconstructions of the cervical spine and maxillofacial structures were also generated. COMPARISON:  None. FINDINGS: CT HEAD FINDINGS Brain: Age related atrophy. Chronic small-vessel ischemic changes throughout the white matter. No sign of acute infarction, mass lesion, hemorrhage, hydrocephalus or extra-axial collection. Vascular: There is atherosclerotic calcification of the major vessels at the base of the brain. Skull: Negative Other: None CT MAXILLOFACIAL FINDINGS Osseous: No evidence of facial fracture. Orbits: No evidence of orbital injury. Sinuses: Clear Soft tissues: No significant soft tissue finding by CT. CT CERVICAL SPINE FINDINGS Alignment: Normal Skull base and vertebrae: No fracture. Superior endplate Schmorl's nodes at C7. Soft tissues and spinal canal: Negative Disc levels: Ordinary spondylosis and facet osteoarthritis. No apparent bony canal stenosis or significant foraminal narrowing. Upper chest: Mild  scarring.  No active process. Other: None IMPRESSION: Head CT: No acute or traumatic finding. Atrophy and chronic small-vessel ischemic changes. Maxillofacial CT: No acute or traumatic finding. Cervical spine CT: No acute or traumatic finding. Ordinary mild degenerative changes fairly typical for age. Electronically Signed   By: Nelson Chimes M.D.   On: 02/13/2019 11:21   DG Hand Complete Left  Result Date: 02/13/2019 CLINICAL DATA:  Pain after fall EXAM: LEFT HAND - COMPLETE 3+ VIEW COMPARISON:  None. FINDINGS: There is no evidence of fracture or dislocation. There is no evidence of arthropathy or other focal bone abnormality. Soft tissues are unremarkable. IMPRESSION: Negative. Electronically Signed   By: Dorise Bullion III M.D   On: 02/13/2019 11:54   DG Hand Complete Right  Result Date: 02/13/2019 CLINICAL DATA:  Pain after fall EXAM: RIGHT HAND - COMPLETE 3+ VIEW COMPARISON:  None. FINDINGS: Degenerative changes primarily in the thumb. No fracture dislocation. IMPRESSION: Degenerative changes in the thumb.  No fracture or dislocation. Electronically Signed   By: Dorise Bullion III M.D   On: 02/13/2019 11:54   DG Hip Unilat With Pelvis 2-3 Views Left  Result Date: 02/13/2019 CLINICAL DATA:  Pain after fall EXAM: DG HIP (WITH OR WITHOUT PELVIS) 2-3V LEFT COMPARISON:  None. FINDINGS: There is no evidence of hip fracture or dislocation. There is no evidence of arthropathy or other focal bone abnormality. IMPRESSION: Negative. Electronically Signed   By: Dorise Bullion III M.D   On: 02/13/2019 11:53   CT Maxillofacial Wo Contrast  Result Date: 02/13/2019 CLINICAL DATA:  [place] to the sidewalk. Trauma to the head, face and neck. EXAM: CT HEAD WITHOUT CONTRAST CT MAXILLOFACIAL WITHOUT CONTRAST CT CERVICAL SPINE WITHOUT CONTRAST TECHNIQUE: Multidetector CT imaging of the head, cervical spine, and maxillofacial structures were performed using the standard protocol without intravenous contrast.  Multiplanar CT image reconstructions of the cervical spine and maxillofacial structures were also generated. COMPARISON:  None. FINDINGS: CT HEAD FINDINGS Brain: Age related atrophy. Chronic small-vessel ischemic changes throughout the white matter. No sign of acute infarction, mass lesion, hemorrhage, hydrocephalus or extra-axial collection. Vascular: There is atherosclerotic calcification of the major vessels at the base of the brain. Skull: Negative Other: None CT MAXILLOFACIAL FINDINGS Osseous: No evidence of facial fracture. Orbits: No evidence of orbital injury. Sinuses: Clear Soft tissues: No significant soft tissue finding by CT. CT CERVICAL SPINE FINDINGS Alignment: Normal Skull base and vertebrae: No  fracture. Superior endplate Schmorl's nodes at C7. Soft tissues and spinal canal: Negative Disc levels: Ordinary spondylosis and facet osteoarthritis. No apparent bony canal stenosis or significant foraminal narrowing. Upper chest: Mild scarring.  No active process. Other: None IMPRESSION: Head CT: No acute or traumatic finding. Atrophy and chronic small-vessel ischemic changes. Maxillofacial CT: No acute or traumatic finding. Cervical spine CT: No acute or traumatic finding. Ordinary mild degenerative changes fairly typical for age. Electronically Signed   By: Nelson Chimes M.D.   On: 02/13/2019 11:21   Assessment/Plan 1. COVID-19 -initiate simplified covid standing orders -encourage po intake of water and food as tolerated -has enjoyed sweet snacks at home  -Very confused and counting on her fingers (does not seem different from baseline), but no covid symptoms outside of poor po intake of food and drink -Labs looked great nonetheless (intake not great at baseline--eats snacks behind Karen's back)  2. Dementia of the Alzheimer's type, with late onset, with delusions ([place]) -continue support in SNF, Iona Beard has been resistant to placement in memory care, but she is progressing so we will see if this  leads to that or if [place]'s return to [place] with visits to [place] memory center  3. OAB (overactive bladder) -cont myrbetriq therapy, depends  4. Pure hypercholesterolemia -remains on zocor--if it becomes too challenging for her to take this, would d/c  5. Osteopenia of multiple sites -continue on vitamin D3 therapy and regular walking   6. Wandering behavior -has not been an issue here, but she had problems with escaping their home and getting out  7. Primary osteoarthritis involving multiple joints -may use tylenol for any pain  8. DNR (do not resuscitate) - DNR (Do Not Resuscitate) order renewed  Family/ staff Communication: discussed with rehab nurses--her husband had just left to go get some respite and self-isolate so I opted not to call him back just to tell him she was doing ok  Labs/tests ordered:  No new--monitor intake and recheck bmp if declines  Buzz Axel L. Marquel Spoto, D.O. [facility] [address], [place] [address] Cell Phone (Mon-Fri 8am-5pm):  [phone] On Call:  [phone] & follow prompts after 5pm & weekends Office Phone:  [phone] Office Fax:  [phone]

## 2020-01-11 ENCOUNTER — Encounter: Payer: Self-pay | Admitting: Internal Medicine

## 2020-01-24 ENCOUNTER — Encounter: Payer: Self-pay | Admitting: Internal Medicine

## 2020-01-24 ENCOUNTER — Non-Acute Institutional Stay (SKILLED_NURSING_FACILITY): Payer: Medicare Other | Admitting: Internal Medicine

## 2020-01-24 DIAGNOSIS — F02818 Dementia in other diseases classified elsewhere, unspecified severity, with other behavioral disturbance: Secondary | ICD-10-CM

## 2020-01-24 DIAGNOSIS — N3281 Overactive bladder: Secondary | ICD-10-CM

## 2020-01-24 DIAGNOSIS — E7800 Pure hypercholesterolemia, unspecified: Secondary | ICD-10-CM

## 2020-01-24 DIAGNOSIS — G301 Alzheimer's disease with late onset: Secondary | ICD-10-CM

## 2020-01-24 DIAGNOSIS — F22 Delusional disorders: Secondary | ICD-10-CM | POA: Diagnosis not present

## 2020-01-24 DIAGNOSIS — M8589 Other specified disorders of bone density and structure, multiple sites: Secondary | ICD-10-CM

## 2020-01-24 DIAGNOSIS — F0281 Dementia in other diseases classified elsewhere with behavioral disturbance: Secondary | ICD-10-CM | POA: Diagnosis not present

## 2020-01-24 DIAGNOSIS — U071 COVID-19: Secondary | ICD-10-CM | POA: Diagnosis not present

## 2020-01-24 DIAGNOSIS — R4689 Other symptoms and signs involving appearance and behavior: Secondary | ICD-10-CM

## 2020-01-24 NOTE — Progress Notes (Signed)
Patient ID: Karen Mclean, female   DOB: 12-10-1933, 86 y.o.   MRN: [id number]  Location:  [place] Room Number: [id number] Place of Service:  SNF ([phone]) Provider:   Gayland Curry, DO  Patient Care Team: Gayland Curry, DO as PCP - General (Geriatric Medicine) Addison Lank, MD as Consulting Physician (Dermatology)  Extended Emergency Contact Information Primary Emergency Contact: Tiznado,[name] Address: [address] [place], [place] [address] [name] of [place] Phone: [phone] Relation: Son  Code Status: DNR Goals of care: Advanced Directive information Advanced Directives 01/24/2020  Does Patient Have a Medical Advance Directive? Yes  Type of Advance Directive Out of facility DNR (pink MOST or yellow form)  Does patient want to make changes to medical advance directive? No - Patient declined  Copy of [place] in Chart? -  Would patient like information on creating a medical advance directive? -     Chief Complaint  Patient presents with  . Discharge Note    d/c from memory care sn[place] per his request    HPI:  Pt is a 86 y.o. female seen today to evaluate for possible discharge home with her husband.  She has advanced dementia dependent in adls.  He even feeds her some at home.  She is still ambulatory w/o assistive device.  She had been in rehab for covid and really did not have any cough, sob, hypoxia, fever, but did not have an appetite.  She lost 13 lbs amid this.  She then transferred to memory care so we could see how she would do there.  Her husband is not yet ready for her to stay there especially since he cannot visit regularly with covid quarantine rules.  She is now eating as usual, requires encouragement.  She is wandering about the unit and counting as she does habitually.  She is very pleasant and sociable.  Discussed plans with Iona Beard during his appt.  He is going to  make arrangements for her to go to the memory center Mon thru Fri so he will have more time for himself and be less stressed taking care of her.  She will stay in memory care until the weekend before they reopen (this coming weekend he'll take her home).  When covid isolation ends, he is willing to move her to [facility] long-term since she's done so well as long as he can visit her.  Past Medical History:  Diagnosis Date  . Alzheimer's disease ([place])   . High cholesterol   . Uterine cancer [facility])    Past Surgical History:  Procedure Laterality Date  . [facility]  . cataract surgery Bilateral     Allergies  Allergen Reactions  . Penicillins Hives  . Sulfa Antibiotics Hives    Outpatient Encounter Medications as of 01/24/2020  Medication Sig  . Cholecalciferol (VITAMIN D3) 2000 units TABS Take 1 tablet by mouth daily.  . Glucosamine-Chondroit-Vit C-Mn (GLUCOSAMINE CHONDR 1500 COMPLX PO) Take 1 tablet by mouth 2 (two) times daily.  . Melatonin 1 MG TABS Take by mouth at bedtime.  . memantine (NAMENDA XR) 28 MG CP24 24 hr capsule Take 1 capsule (28 mg total) by mouth daily.  . Multiple Vitamins-Minerals (MULTIVITAMIN WITH MINERALS) tablet Take 1 tablet by mouth daily.  Marland Kitchen MYRBETRIQ 50 MG TB24 tablet TAKE 1 TABLET DAILY  .  rivastigmine (EXELON) 3 MG capsule Take 1 capsule (3 mg total) by mouth 2 (two) times daily.  . simvastatin (ZOCOR) 20 MG tablet TAKE 1 TABLET DAILY   No facility-administered encounter medications on file as of 01/24/2020.    Review of Systems  Constitutional: Positive for appetite change and unexpected weight change. Negative for activity change, chills and fever.  HENT: Negative for sore throat and trouble swallowing.   Eyes: Negative for visual disturbance.  Respiratory: Negative for cough, chest tightness and shortness of breath.   Gastrointestinal: Negative for abdominal pain, constipation, diarrhea, nausea and vomiting.    Genitourinary: Positive for urgency. Negative for dysuria.       Incontinence if not taken faithfully on a schedule   Musculoskeletal: Negative for arthralgias, back pain and gait problem.  Skin: Negative for color change.  Neurological: Negative for dizziness and weakness.  Psychiatric/Behavioral: Negative for agitation, behavioral problems, confusion and sleep disturbance.    Immunization History  Administered Date(s) Administered  . Influenza, High Dose Seasonal PF 10/07/2019  . Influenza,inj,Quad PF,6+ Mos 10/22/2018  . Influenza-Unspecified 10/31/2015, 10/23/2016, 10/19/2017  . Pneumococcal Conjugate-13 06/16/2018  . Pneumococcal Polysaccharide-23 02/04/2012  . Tdap 10/16/2018  . Zoster Recombinat (Shingrix) 07/10/2017, 11/13/2017, 06/27/2018   Pertinent  Health Maintenance Due  Topic Date Due  . INFLUENZA VACCINE  Completed  . DEXA SCAN  Completed  . PNA vac Low Risk Adult  Completed   Fall Risk  10/24/2019 08/24/2019 04/27/2019 02/23/2019 10/13/2018  Falls in the past year? 1 0 1 1 No  Number falls in past yr: 0 0 0 0 -  Injury with Fall? 1 0 1 1 -  Risk for fall due to : - - - History of fall(s);Impaired balance/gait;Impaired mobility;Mental status change;Medication side effect -  Follow up - - - Falls evaluation completed;Education provided;Falls prevention discussed -  Comment - - - PT referral -   Functional Status Survey:    Vitals:   01/24/20 1129  BP: 122/67  Pulse: 71  Temp: (!) 97.3 F (36.3 C)  Weight: 109 lb 6.4 oz (49.6 kg)   Body mass index is 20.67 kg/m. Physical Exam Vitals reviewed.  Constitutional:      General: She is not in acute distress.    Appearance: Normal appearance. She is not toxic-appearing.  HENT:     Head: Normocephalic and atraumatic.  Cardiovascular:     Rate and Rhythm: Normal rate and regular rhythm.     Pulses: Normal pulses.     Heart sounds: Normal heart sounds. No murmur.  Pulmonary:     Effort: Pulmonary effort is  normal.     Breath sounds: Normal breath sounds.  Abdominal:     General: Bowel sounds are normal.     Palpations: Abdomen is soft.  Musculoskeletal:        General: Normal range of motion.     Right lower leg: No edema.     Left lower leg: No edema.  Skin:    General: Skin is warm and dry.     Coloration: Skin is pale.  Neurological:     General: No focal deficit present.     Mental Status: She is alert. Mental status is at baseline.     Cranial Nerves: No cranial nerve deficit.     Gait: Gait abnormal.     Comments: Unsteady gait, does best holding someone's hand  Psychiatric:        Mood and Affect: Mood normal.  Labs reviewed: Recent Labs    04/19/19 0000 01/09/20 0000  NA 145 140  K 4.3 4.3  CL  --  102  CO2  --  29*  BUN 17 17  CREATININE 0.9 0.9  CALCIUM  --  9.3   Recent Labs    04/19/19 0800  AST 22  ALT 12  ALKPHOS 64   Recent Labs    04/19/19 0000 01/09/20 0000  WBC 5.2 4.9  HGB 13.5 12.3  HCT 39 36  PLT 221 256   Lab Results  Component Value Date   TSH 3.70 02/24/2017   No results found for: HGBA1C Lab Results  Component Value Date   CHOL 138 04/19/2019   HDL 59 04/19/2019   LDLCALC 59 04/19/2019   TRIG 100 04/19/2019    Assessment/Plan 1. COVID-19 -recovered  2. Dementia of the Alzheimer's type, with late onset, with delusions ([place]) -advanced stage, ambulatory and social, wanders, pleasantly confused, likes to sit and count, needs encouragement to eat and scheduled toileting  3. OAB (overactive bladder) -on myrbetriq but does best with q 2 h toileting  4. Pure hypercholesterolemia -remains on zocor at this point, if taking pills becomes more of a problem again, would stop this (at one time Iona Beard was having trouble getting her to take her pills)  5. Osteopenia of multiple sites -cont mvi and D3  6. Wandering behavior -does great where she can do this w/o restrictions like in [address] and at memory center -husband has  alarms on doors due to this  Family/ staff Communication: discussed with memory care nurse, DON, nurse managers, social work and pt's husband  Labs/tests ordered:  No new  Andraya Frigon L. Temara Lanum, D.O. [facility] [address], [place] [address] Cell Phone (Mon-Fri 8am-5pm):  [phone] On Call:  [phone] & follow prompts after 5pm & weekends Office Phone:  [phone] Office Fax:  [phone]

## 2020-04-04 ENCOUNTER — Other Ambulatory Visit: Payer: Self-pay

## 2020-04-04 MED ORDER — MIRABEGRON ER 50 MG PO TB24: 50.0000 mg | ORAL_TABLET | Freq: Every day | ORAL | 3 refills | Status: DC

## 2020-04-23 DIAGNOSIS — Z961 Presence of intraocular lens: Secondary | ICD-10-CM | POA: Diagnosis not present

## 2020-05-01 ENCOUNTER — Encounter: Payer: Self-pay | Admitting: Internal Medicine

## 2020-05-01 ENCOUNTER — Non-Acute Institutional Stay: Payer: Medicare Other | Admitting: Internal Medicine

## 2020-05-01 DIAGNOSIS — N3281 Overactive bladder: Secondary | ICD-10-CM

## 2020-05-01 DIAGNOSIS — F22 Delusional disorders: Secondary | ICD-10-CM

## 2020-05-01 DIAGNOSIS — E7800 Pure hypercholesterolemia, unspecified: Secondary | ICD-10-CM | POA: Diagnosis not present

## 2020-05-01 DIAGNOSIS — M8949 Other hypertrophic osteoarthropathy, multiple sites: Secondary | ICD-10-CM

## 2020-05-01 DIAGNOSIS — R4689 Other symptoms and signs involving appearance and behavior: Secondary | ICD-10-CM | POA: Diagnosis not present

## 2020-05-01 DIAGNOSIS — F02818 Dementia in other diseases classified elsewhere, unspecified severity, with other behavioral disturbance: Secondary | ICD-10-CM

## 2020-05-01 DIAGNOSIS — G301 Alzheimer's disease with late onset: Secondary | ICD-10-CM | POA: Diagnosis not present

## 2020-05-01 DIAGNOSIS — M8589 Other specified disorders of bone density and structure, multiple sites: Secondary | ICD-10-CM

## 2020-05-01 DIAGNOSIS — F0281 Dementia in other diseases classified elsewhere with behavioral disturbance: Secondary | ICD-10-CM

## 2020-05-01 DIAGNOSIS — M159 Polyosteoarthritis, unspecified: Secondary | ICD-10-CM

## 2020-05-01 NOTE — Progress Notes (Signed)
Patient ID: JAXSYN ERBES, female   DOB: 01-06-1933, 86 y.o.   MRN: [id number]  Provider:  Rexene Edison. Mariea Clonts, D.O., C.M.D. Location:  [facility] Room Number: [id number] Place of Service:  ALF (13)  PCP: Gayland Curry, DO Patient Care Team: Gayland Curry, DO as PCP - General (Geriatric Medicine) Addison Lank, MD as Consulting Physician (Dermatology)  Extended Emergency Contact Information Primary Emergency Contact: Tenpas,[name] Address: [address] [place], [place] [address] [name] of [place] Phone: [phone] Relation: Son  Code Status: DNR goldenrod form signed for paper chart and will be uploaded by Social work into [employer] of Care: Advanced Directive information Advanced Directives 05/01/2020  Does Patient Have a Medical Advance Directive? Yes  Type of Paramedic of [place];Living will;Out of facility DNR (pink MOST or yellow form)  Does patient want to make changes to medical advance directive? No - Patient declined  Copy of [place] in Chart? Yes - validated most recent copy scanned in chart (See row information)  Would patient like information on creating a medical advance directive? -  Pre-existing out of facility DNR order (yellow form or pink MOST form) Pink MOST/Yellow Form most recent copy in chart - Physician notified to receive inpatient order      Chief Complaint  Patient presents with  . New Admit To AL    New admit for progressive dementia     HPI: Patient is a 86 y.o. female seen today for admission to [facility] memory care AL bed due to progressive dementia.  Mrs. Thurn has been living in [place] with her husband, Iona Beard, but this has become increasingly challenging.  She's struggled with taking her meds at home.  She has had wandering difficulty.  Iona Beard has been having more difficulty caring for her and a room became available for [facility].  She had stayed for a respite stay after she had covid in Jan.  Iona Beard had agreed for her to move in after covid isolation ended so he could still come visit her.  She remains pleasant and sociable.  The clinic nurse had been playing the piano in memory care and pt loved it, was dancing and pretending herself to play yesterday.    She's had overactive bladder and has been on myrbetriq.  I've never been able to get a clear story of whether it's helping her when she's been at home.    She has struggled with rest at night and that's when one of her wandering off spells occurred at home.  She has been on melatonin.  At this point, she's content wandering around and was following the housekeeper last night.  One of the other residents is also up at night.    Weight is stable since Jan of this year.    Past Medical History:  Diagnosis Date  . Alzheimer's disease ([place])   . High cholesterol   . Uterine cancer [facility])    Past Surgical History:  Procedure Laterality Date  . [facility]  . cataract surgery Bilateral     Social History   Socioeconomic History  . Marital status: Married    Spouse name: Not on file  . Number of children: 2  . Years of education: Not on file  . Highest education level: Not on file  Occupational History  .  Occupation: Education officer, museum  Tobacco Use  . Smoking status: Never Smoker  . Smokeless tobacco: Former Network engineer and Sexual Activity  . Alcohol use: Not Currently    Alcohol/week: 2.0 - 3.0 standard drinks    Types: 1 Glasses of wine, 1 - 2 Standard drinks or equivalent per week    Comment: 1/2 glass of wine a night  . Drug use: No  . Sexual activity: Not on file  Other Topics Concern  . Not on file  Social History Narrative   Diet? No      Do you drink/eat things with caffeine?  No      Marital status?     Married                               What year were you married?  1960      Do you live in a  house, apartment, assisted living, condo, trailer, etc.?  [place]      Is it one or more stories? One      How many persons live in your home? 2      Do you have any pets in your home? (please list) No      Current or past profession:  Retired Education officer, museum      Do you exercise?               yes                       Type & how often?  Walk daily      Do you have a living will? no      Do you have a DNR form?     no                             If not, do you want to discuss one? yes      Do you have signed POA/HPOA for forms? yes      Social Determinants of Health   Financial Resource Strain:   . Difficulty of Paying Living Expenses:   Food Insecurity:   . Worried About Charity fundraiser in the Last Year:   . Arboriculturist in the Last Year:   Transportation Needs:   . Film/video editor (Medical):   Marland Kitchen Lack of Transportation (Non-Medical):   Physical Activity:   . Days of Exercise per Week:   . Minutes of Exercise per Session:   Stress:   . Feeling of Stress :   Social Connections:   . Frequency of Communication with Friends and Family:   . Frequency of Social Gatherings with Friends and Family:   . Attends Religious Services:   . Active Member of Clubs or Organizations:   . Attends Archivist Meetings:   Marland Kitchen Marital Status:     reports that she has never smoked. She has quit using smokeless tobacco. She reports previous alcohol use of about 2.0 - 3.0 standard drinks of alcohol per week. She reports that she does not use drugs.  Functional Status Survey:    Family History  Problem Relation Age of Onset  . Suicidality Brother   . Heart failure Brother   . Leukemia Sister     Health Maintenance  Topic Date Due  . COVID-19 Vaccine (1) Never done  .  INFLUENZA VACCINE  07/29/2020  . TETANUS/TDAP  10/16/2028  . DEXA SCAN  Completed  . PNA vac Low Risk Adult  Completed    Allergies  Allergen Reactions  . Penicillins Hives    . Sulfa Antibiotics Hives    Outpatient Encounter Medications as of 05/01/2020  Medication Sig  . Cholecalciferol (VITAMIN D3) 2000 units TABS Take 1 tablet by mouth daily.  . Glucosamine-Chondroit-Vit C-Mn (GLUCOSAMINE CHONDR 1500 COMPLX PO) Take 1 tablet by mouth 2 (two) times daily.  . Melatonin 1 MG TABS Take by mouth at bedtime.  . memantine (NAMENDA XR) 28 MG CP24 24 hr capsule Take 1 capsule (28 mg total) by mouth daily.  . mirabegron ER (MYRBETRIQ) 50 MG TB24 tablet Take 1 tablet (50 mg total) by mouth daily.  . Multiple Vitamins-Minerals (MULTIVITAMIN WITH MINERALS) tablet Take 1 tablet by mouth daily.  . rivastigmine (EXELON) 3 MG capsule Take 1 capsule (3 mg total) by mouth 2 (two) times daily.  . simvastatin (ZOCOR) 20 MG tablet TAKE 1 TABLET DAILY   No facility-administered encounter medications on file as of 05/01/2020.    Review of Systems  Constitutional: Negative for chills, fever and malaise/fatigue.  HENT: Negative for congestion and sore throat.   Eyes: Negative for blurred vision.  Respiratory: Negative for cough and shortness of breath.   Cardiovascular: Negative for chest pain, palpitations and leg swelling.  Gastrointestinal: Negative for abdominal pain, constipation, diarrhea, heartburn, nausea and vomiting.  Genitourinary: Positive for frequency and urgency. Negative for dysuria.       Overactive bladder  Musculoskeletal: Negative for falls and joint pain.  Skin: Negative for itching and rash.  Neurological: Negative for dizziness and loss of consciousness.  Endo/Heme/Allergies: Bruises/bleeds easily.  Psychiatric/Behavioral: Positive for memory loss. Negative for depression and hallucinations. The patient has insomnia. The patient is not nervous/anxious.     Vitals:   05/01/20 1203  BP: 127/70  Pulse: 62  Temp: (!) 97 F (36.1 C)  SpO2: 99%  Weight: 109 lb 6.4 oz (49.6 kg)  Height: 5\' 1"  (1.549 m)   Body mass index is 20.67 kg/m. Physical  Exam Vitals reviewed.  Constitutional:      General: She is not in acute distress.    Appearance: Normal appearance. She is not toxic-appearing.  HENT:     Head: Normocephalic and atraumatic.     Right Ear: External ear normal.     Left Ear: External ear normal.     Nose: Nose normal. No congestion.     Mouth/Throat:     Pharynx: Oropharynx is clear. No oropharyngeal exudate.  Eyes:     Extraocular Movements: Extraocular movements intact.     Pupils: Pupils are equal, round, and reactive to light.  Cardiovascular:     Rate and Rhythm: Normal rate and regular rhythm.     Pulses: Normal pulses.     Heart sounds: Normal heart sounds.  Pulmonary:     Effort: Pulmonary effort is normal.     Breath sounds: Normal breath sounds. No wheezing, rhonchi or rales.  Abdominal:     General: Bowel sounds are normal. There is no distension.     Palpations: Abdomen is soft. There is no mass.     Tenderness: There is no abdominal tenderness. There is no guarding or rebound.  Musculoskeletal:        General: Normal range of motion.     Cervical back: Neck supple.     Right lower leg: No edema.  Left lower leg: No edema.  Skin:    General: Skin is warm and dry.  Neurological:     General: No focal deficit present.     Mental Status: She is alert.     Cranial Nerves: No cranial nerve deficit.     Sensory: No sensory deficit.     Motor: No weakness.     Coordination: Coordination normal.     Gait: Gait normal.     Deep Tendon Reflexes: Reflexes normal.     Comments: Walks w/o assistive device  Psychiatric:        Mood and Affect: Mood normal.     Comments: Pleasant      Labs reviewed: Basic Metabolic Panel: Recent Labs    01/09/20 0000  NA 140  K 4.3  CL 102  CO2 29*  BUN 17  CREATININE 0.9  CALCIUM 9.3   Liver Function Tests: No results for input(s): AST, ALT, ALKPHOS, BILITOT, PROT, ALBUMIN in the last 8760 hours. No results for input(s): LIPASE, AMYLASE in the last  8760 hours. No results for input(s): AMMONIA in the last 8760 hours. CBC: Recent Labs    01/09/20 0000  WBC 4.9  HGB 12.3  HCT 36  PLT 256   Cardiac Enzymes: No results for input(s): CKTOTAL, CKMB, CKMBINDEX, TROPONINI in the last 8760 hours. BNP: Invalid input(s): POCBNP No results found for: HGBA1C Lab Results  Component Value Date   TSH 3.70 02/24/2017   Lab Results  Component Value Date   [id number] 02/24/2017   No results found for: FOLATE No results found for: IRON, TIBC, FERRITIN  Imaging and Procedures obtained prior to SNF admission: DG Chest 1 View  Result Date: 02/13/2019 CLINICAL DATA:  Pain after fall EXAM: CHEST  1 VIEW COMPARISON:  None. FINDINGS: The heart size and mediastinal contours are within normal limits. Both lungs are clear. The visualized skeletal structures are unremarkable. IMPRESSION: No active disease. Electronically Signed   By: Dorise Bullion III M.D   On: 02/13/2019 11:55   DG Wrist Complete Left  Result Date: 02/13/2019 CLINICAL DATA:  Fall.  Pain. EXAM: LEFT WRIST - COMPLETE 3+ VIEW COMPARISON:  None. FINDINGS: There is a mildly unusual configuration of the scaphoid but the configuration is similar to the right. No convincing evidence of scaphoid fracture. No other fractures identified. IMPRESSION: There is a mildly unusual configuration to the scaphoid which is similar on the left to the right and probably normal for this patient. Recommend clinical correlation to exclude snuffbox tenderness. No convincing evidence of fracture on this study. Electronically Signed   By: Dorise Bullion III M.D   On: 02/13/2019 11:47   DG Wrist Complete Right  Result Date: 02/13/2019 CLINICAL DATA:  Pain after fall EXAM: RIGHT WRIST - COMPLETE 3+ VIEW COMPARISON:  None. FINDINGS: There is no evidence of fracture or dislocation. There is no evidence of arthropathy or other focal bone abnormality. Soft tissues are unremarkable. IMPRESSION: Negative.  Electronically Signed   By: Dorise Bullion III M.D   On: 02/13/2019 11:52   CT Head Wo Contrast  Result Date: 02/13/2019 CLINICAL DATA:  [place] to the sidewalk. Trauma to the head, face and neck. EXAM: CT HEAD WITHOUT CONTRAST CT MAXILLOFACIAL WITHOUT CONTRAST CT CERVICAL SPINE WITHOUT CONTRAST TECHNIQUE: Multidetector CT imaging of the head, cervical spine, and maxillofacial structures were performed using the standard protocol without intravenous contrast. Multiplanar CT image reconstructions of the cervical spine and maxillofacial structures were also generated. COMPARISON:  None. FINDINGS: CT HEAD FINDINGS Brain: Age related atrophy. Chronic small-vessel ischemic changes throughout the white matter. No sign of acute infarction, mass lesion, hemorrhage, hydrocephalus or extra-axial collection. Vascular: There is atherosclerotic calcification of the major vessels at the base of the brain. Skull: Negative Other: None CT MAXILLOFACIAL FINDINGS Osseous: No evidence of facial fracture. Orbits: No evidence of orbital injury. Sinuses: Clear Soft tissues: No significant soft tissue finding by CT. CT CERVICAL SPINE FINDINGS Alignment: Normal Skull base and vertebrae: No fracture. Superior endplate Schmorl's nodes at C7. Soft tissues and spinal canal: Negative Disc levels: Ordinary spondylosis and facet osteoarthritis. No apparent bony canal stenosis or significant foraminal narrowing. Upper chest: Mild scarring.  No active process. Other: None IMPRESSION: Head CT: No acute or traumatic finding. Atrophy and chronic small-vessel ischemic changes. Maxillofacial CT: No acute or traumatic finding. Cervical spine CT: No acute or traumatic finding. Ordinary mild degenerative changes fairly typical for age. Electronically Signed   By: Nelson Chimes M.D.   On: 02/13/2019 11:21   CT Cervical Spine Wo Contrast  Result Date: 02/13/2019 CLINICAL DATA:  [place] to the sidewalk. Trauma to the head, face and neck. EXAM: CT HEAD  WITHOUT CONTRAST CT MAXILLOFACIAL WITHOUT CONTRAST CT CERVICAL SPINE WITHOUT CONTRAST TECHNIQUE: Multidetector CT imaging of the head, cervical spine, and maxillofacial structures were performed using the standard protocol without intravenous contrast. Multiplanar CT image reconstructions of the cervical spine and maxillofacial structures were also generated. COMPARISON:  None. FINDINGS: CT HEAD FINDINGS Brain: Age related atrophy. Chronic small-vessel ischemic changes throughout the white matter. No sign of acute infarction, mass lesion, hemorrhage, hydrocephalus or extra-axial collection. Vascular: There is atherosclerotic calcification of the major vessels at the base of the brain. Skull: Negative Other: None CT MAXILLOFACIAL FINDINGS Osseous: No evidence of facial fracture. Orbits: No evidence of orbital injury. Sinuses: Clear Soft tissues: No significant soft tissue finding by CT. CT CERVICAL SPINE FINDINGS Alignment: Normal Skull base and vertebrae: No fracture. Superior endplate Schmorl's nodes at C7. Soft tissues and spinal canal: Negative Disc levels: Ordinary spondylosis and facet osteoarthritis. No apparent bony canal stenosis or significant foraminal narrowing. Upper chest: Mild scarring.  No active process. Other: None IMPRESSION: Head CT: No acute or traumatic finding. Atrophy and chronic small-vessel ischemic changes. Maxillofacial CT: No acute or traumatic finding. Cervical spine CT: No acute or traumatic finding. Ordinary mild degenerative changes fairly typical for age. Electronically Signed   By: Nelson Chimes M.D.   On: 02/13/2019 11:21   DG Hand Complete Left  Result Date: 02/13/2019 CLINICAL DATA:  Pain after fall EXAM: LEFT HAND - COMPLETE 3+ VIEW COMPARISON:  None. FINDINGS: There is no evidence of fracture or dislocation. There is no evidence of arthropathy or other focal bone abnormality. Soft tissues are unremarkable. IMPRESSION: Negative. Electronically Signed   By: Dorise Bullion III  M.D   On: 02/13/2019 11:54   DG Hand Complete Right  Result Date: 02/13/2019 CLINICAL DATA:  Pain after fall EXAM: RIGHT HAND - COMPLETE 3+ VIEW COMPARISON:  None. FINDINGS: Degenerative changes primarily in the thumb. No fracture dislocation. IMPRESSION: Degenerative changes in the thumb.  No fracture or dislocation. Electronically Signed   By: Dorise Bullion III M.D   On: 02/13/2019 11:54   DG Hip Unilat With Pelvis 2-3 Views Left  Result Date: 02/13/2019 CLINICAL DATA:  Pain after fall EXAM: DG HIP (WITH OR WITHOUT PELVIS) 2-3V LEFT COMPARISON:  None. FINDINGS: There is no evidence of hip fracture or dislocation. There  is no evidence of arthropathy or other focal bone abnormality. IMPRESSION: Negative. Electronically Signed   By: Dorise Bullion III M.D   On: 02/13/2019 11:53   CT Maxillofacial Wo Contrast  Result Date: 02/13/2019 CLINICAL DATA:  [place] to the sidewalk. Trauma to the head, face and neck. EXAM: CT HEAD WITHOUT CONTRAST CT MAXILLOFACIAL WITHOUT CONTRAST CT CERVICAL SPINE WITHOUT CONTRAST TECHNIQUE: Multidetector CT imaging of the head, cervical spine, and maxillofacial structures were performed using the standard protocol without intravenous contrast. Multiplanar CT image reconstructions of the cervical spine and maxillofacial structures were also generated. COMPARISON:  None. FINDINGS: CT HEAD FINDINGS Brain: Age related atrophy. Chronic small-vessel ischemic changes throughout the white matter. No sign of acute infarction, mass lesion, hemorrhage, hydrocephalus or extra-axial collection. Vascular: There is atherosclerotic calcification of the major vessels at the base of the brain. Skull: Negative Other: None CT MAXILLOFACIAL FINDINGS Osseous: No evidence of facial fracture. Orbits: No evidence of orbital injury. Sinuses: Clear Soft tissues: No significant soft tissue finding by CT. CT CERVICAL SPINE FINDINGS Alignment: Normal Skull base and vertebrae: No fracture. Superior endplate  Schmorl's nodes at C7. Soft tissues and spinal canal: Negative Disc levels: Ordinary spondylosis and facet osteoarthritis. No apparent bony canal stenosis or significant foraminal narrowing. Upper chest: Mild scarring.  No active process. Other: None IMPRESSION: Head CT: No acute or traumatic finding. Atrophy and chronic small-vessel ischemic changes. Maxillofacial CT: No acute or traumatic finding. Cervical spine CT: No acute or traumatic finding. Ordinary mild degenerative changes fairly typical for age. Electronically Signed   By: Nelson Chimes M.D.   On: 02/13/2019 11:21    Assessment/Plan 1. Dementia of the Alzheimer's type, with late onset, with delusions ([place]) -has been progressing, requiring more assistance with ADLs -remains independently ambulatory and at least sometimes continent of urine -continues on exelon capsule 3mg  po bid and namenda XR 28mg  daily -has some challenges with sleep at night--on melatonin, but I see no reason to treat her with sedatives if it does not affect her quality of life   2. OAB (overactive bladder) -cont myrbetriq pending more info from CNAs in memory care--if frequently incontinent, it may not make sense cost-wise  3. Pure hypercholesterolemia -remains on zocor for her cholesterol--if she has challenges with pills here, would d/c this -last cholesterol was at goal Lab Results  Component Value Date   LDLCALC 59 04/19/2019    4. Osteopenia of multiple sites -continue vitamin D3 and walking for exercise; encourage other exercise programs, as well  5. Wandering behavior -was problematic at home, but should not be here in a locked unit designed to allow residents with dementia wander  6. Primary osteoarthritis involving multiple joints -continues on glucosamine/chondroitin; may use tylenol if needed for arthritis pain or topicals like voltaren if she has discomfort  Family/ staff Communication: discussed with memory care nurse  Labs/tests  ordered:  Chibuikem Thang L. Shilpa Bushee, D.O. [facility] [address] [place], [place] [address] Cell Phone (Mon-Fri 8am-5pm):  [phone] On Call:  [phone] & follow prompts after 5pm & weekends Office Phone:  [phone] Office Fax:  [phone]

## 2020-05-08 DIAGNOSIS — L602 Onychogryphosis: Secondary | ICD-10-CM | POA: Diagnosis not present

## 2020-05-08 DIAGNOSIS — Q6689 Other  specified congenital deformities of feet: Secondary | ICD-10-CM | POA: Diagnosis not present

## 2020-07-13 ENCOUNTER — Non-Acute Institutional Stay: Payer: Medicare Other | Admitting: Adult Health

## 2020-07-13 ENCOUNTER — Encounter: Payer: Self-pay | Admitting: Adult Health

## 2020-07-13 DIAGNOSIS — N3281 Overactive bladder: Secondary | ICD-10-CM

## 2020-07-13 DIAGNOSIS — G301 Alzheimer's disease with late onset: Secondary | ICD-10-CM | POA: Diagnosis not present

## 2020-07-13 DIAGNOSIS — G4700 Insomnia, unspecified: Secondary | ICD-10-CM | POA: Insufficient documentation

## 2020-07-13 DIAGNOSIS — F5101 Primary insomnia: Secondary | ICD-10-CM | POA: Diagnosis not present

## 2020-07-13 DIAGNOSIS — F0281 Dementia in other diseases classified elsewhere with behavioral disturbance: Secondary | ICD-10-CM

## 2020-07-13 DIAGNOSIS — F02818 Dementia in other diseases classified elsewhere, unspecified severity, with other behavioral disturbance: Secondary | ICD-10-CM

## 2020-07-13 DIAGNOSIS — E7800 Pure hypercholesterolemia, unspecified: Secondary | ICD-10-CM | POA: Diagnosis not present

## 2020-07-13 NOTE — Progress Notes (Addendum)
Location:  Occupational psychologist of Service:  ALF (13) Provider:   Cindi Carbon, ANP [place] [phone]   Gayland Curry, DO  Patient Care Team: Gayland Curry, DO as PCP - General (Geriatric Medicine) Addison Lank, MD as Consulting Physician (Dermatology)  Extended Emergency Contact Information Primary Emergency Contact: Tamella, Tuccillo Address: [address] [place], [place] [address] [name] of [place] Phone: [phone] Relation: Son  Code Status:  DNR Goals of care: Advanced Directive information Advanced Directives 05/01/2020  Does Patient Have a Medical Advance Directive? Yes  Type of Paramedic of [place];Living will;Out of facility DNR (pink MOST or yellow form)  Does patient want to make changes to medical advance directive? No - Patient declined  Copy of [place] in Chart? Yes - validated most recent copy scanned in chart (See row information)  Would patient like information on creating a medical advance directive? -  Pre-existing out of facility DNR order (yellow form or pink MOST form) Pink MOST/Yellow Form most recent copy in chart - Physician notified to receive inpatient order     Chief Complaint  Patient presents with  . Medical Management of Chronic Issues    HPI:  Pt is a 86 y.o. female seen today for medical management of chronic diseases.   Ms. Camus moved to memory care two months ago. She remains ambulatory and able to feed herself. She has adjusted well and is pleasant. The nurse reports that she repetitively counts and ask "Am I ok?" frequently. She does not have any delusional behavior or aggression. Her weight has been stable since admission and she is sleeping well per staff.   Wt Readings from Last 3 Encounters:  07/13/20 112 lb 6.4 oz (51 kg)  05/01/20 109 lb 6.4 oz (49.6 kg)  01/24/20 109 lb 6.4 oz (49.6 kg)   She was on myrbetriq but  this was discontinued as she would not swallow it. There are no reports if bladder problems at this time.   Past Medical History:  Diagnosis Date  . Alzheimer's disease ([place])   . High cholesterol   . Uterine cancer [facility])    Past Surgical History:  Procedure Laterality Date  . [facility]  . cataract surgery Bilateral     Allergies  Allergen Reactions  . Penicillins Hives  . Sulfa Antibiotics Hives    Outpatient Encounter Medications as of 07/13/2020  Medication Sig  . Cholecalciferol (VITAMIN D3) 2000 units TABS Take 1 tablet by mouth daily.  . Glucosamine-Chondroit-Vit C-Mn (GLUCOSAMINE CHONDR 1500 COMPLX PO) Take 1 tablet by mouth 2 (two) times daily.  . Melatonin 1 MG TABS Take by mouth at bedtime.  . memantine (NAMENDA XR) 28 MG CP24 24 hr capsule Take 1 capsule (28 mg total) by mouth daily.  . Multiple Vitamins-Minerals (MULTIVITAMIN WITH MINERALS) tablet Take 1 tablet by mouth daily.  . rivastigmine (EXELON) 3 MG capsule Take 1 capsule (3 mg total) by mouth 2 (two) times daily.  . simvastatin (ZOCOR) 20 MG tablet TAKE 1 TABLET DAILY  . [DISCONTINUED] mirabegron ER (MYRBETRIQ) 50 MG TB24 tablet Take 1 tablet (50 mg total) by mouth daily.   No facility-administered encounter medications on file as of 07/13/2020.    Review of Systems  Constitutional: Negative for activity change, appetite change, chills, diaphoresis, fatigue, fever and unexpected weight change.  HENT:  Negative for congestion.   Respiratory: Negative for cough, shortness of breath and wheezing.   Cardiovascular: Negative for chest pain, palpitations and leg swelling.  Gastrointestinal: Negative for abdominal distention, abdominal pain, constipation and diarrhea.  Genitourinary: Negative for difficulty urinating and dysuria.  Musculoskeletal: Negative for arthralgias, back pain, gait problem, joint swelling and myalgias.  Neurological: Negative for dizziness, tremors,  seizures, syncope, facial asymmetry, speech difficulty, weakness, light-headedness, numbness and headaches.  Psychiatric/Behavioral: Positive for confusion. Negative for agitation and behavioral problems.    Immunization History  Administered Date(s) Administered  . Influenza, High Dose Seasonal PF 10/07/2019  . Influenza,inj,Quad PF,6+ Mos 10/22/2018  . Influenza-Unspecified 10/31/2015, 10/23/2016, 10/19/2017  . Moderna SARS-COVID-2 Vaccination 01/31/2020, 02/28/2020  . Pneumococcal Conjugate-13 06/16/2018  . Pneumococcal Polysaccharide-23 02/04/2012  . Tdap 10/16/2018  . Zoster Recombinat (Shingrix) 07/10/2017, 11/13/2017, 06/27/2018   Pertinent  Health Maintenance Due  Topic Date Due  . INFLUENZA VACCINE  07/29/2020  . DEXA SCAN  Completed  . PNA vac Low Risk Adult  Completed   Fall Risk  10/24/2019 08/24/2019 04/27/2019 02/23/2019 10/13/2018  Falls in the past year? 1 0 1 1 No  Number falls in past yr: 0 0 0 0 -  Injury with Fall? 1 0 1 1 -  Risk for fall due to : - - - History of fall(s);Impaired balance/gait;Impaired mobility;Mental status change;Medication side effect -  Follow up - - - Falls evaluation completed;Education provided;Falls prevention discussed -  Comment - - - PT referral -   Functional Status Survey:    Vitals:   07/13/20 1446  Weight: 112 lb 6.4 oz (51 kg)   Body mass index is 21.24 kg/m. Physical Exam Vitals and nursing note reviewed.  Constitutional:      General: She is not in acute distress.    Appearance: She is not diaphoretic.  HENT:     Head: Normocephalic and atraumatic.     Mouth/Throat:     Mouth: Mucous membranes are moist.     Pharynx: Oropharynx is clear. No oropharyngeal exudate.  Neck:     Vascular: No JVD.  Cardiovascular:     Rate and Rhythm: Normal rate and regular rhythm.     Heart sounds: No murmur heard.   Pulmonary:     Effort: Pulmonary effort is normal. No respiratory distress.     Breath sounds: Normal breath  sounds. No wheezing.  Abdominal:     General: Abdomen is flat. Bowel sounds are normal.     Palpations: Abdomen is soft.  Musculoskeletal:     Right lower leg: No edema.     Left lower leg: No edema.     Comments: BUE strength 5/5 BLE strength 5/5  Skin:    General: Skin is warm and dry.  Neurological:     General: No focal deficit present.     Mental Status: She is alert. Mental status is at baseline.  Psychiatric:        Mood and Affect: Mood normal.     Labs reviewed: Recent Labs    01/09/20 0000  NA 140  K 4.3  CL 102  CO2 29*  BUN 17  CREATININE 0.9  CALCIUM 9.3   No results for input(s): AST, ALT, ALKPHOS, BILITOT, PROT, ALBUMIN in the last 8760 hours. Recent Labs    01/09/20 0000  WBC 4.9  HGB 12.3  HCT 36  PLT 256   Lab Results  Component Value Date   TSH 3.70 02/24/2017   No results  found for: HGBA1C Lab Results  Component Value Date   CHOL 138 04/19/2019   HDL 59 04/19/2019   LDLCALC 59 04/19/2019   TRIG 100 04/19/2019    Significant Diagnostic Results in last 30 days:  No results found.  Assessment/Plan 1. Late onset Alzheimer's disease with behavioral disturbance ([place]) Could not answer q's for MMSE She seems to have adjusted well to the memory care unit. She remains ambulatory and likely benefits from her current regimen. Will continue exelon and Namenda.   2. OAB (overactive bladder) Not noted in visit Not currently taking any meds.   3. Pure hypercholesterolemia Lab Results  Component Value Date   LDLCALC 59 04/19/2019  Continue Zocor 20 mg qd   4. Primary insomnia Sleeping well, continue melatonin 1 mg qhs     Family/ staff Communication: discussed with her nurse Danielle  Labs/tests ordered:  NA

## 2020-08-13 DIAGNOSIS — Z9189 Other specified personal risk factors, not elsewhere classified: Secondary | ICD-10-CM | POA: Diagnosis not present

## 2020-08-13 DIAGNOSIS — Z20828 Contact with and (suspected) exposure to other viral communicable diseases: Secondary | ICD-10-CM | POA: Diagnosis not present

## 2020-08-20 DIAGNOSIS — Z20828 Contact with and (suspected) exposure to other viral communicable diseases: Secondary | ICD-10-CM | POA: Diagnosis not present

## 2020-08-20 DIAGNOSIS — Z9189 Other specified personal risk factors, not elsewhere classified: Secondary | ICD-10-CM | POA: Diagnosis not present

## 2020-09-07 DIAGNOSIS — Z9189 Other specified personal risk factors, not elsewhere classified: Secondary | ICD-10-CM | POA: Diagnosis not present

## 2020-09-07 DIAGNOSIS — Z20828 Contact with and (suspected) exposure to other viral communicable diseases: Secondary | ICD-10-CM | POA: Diagnosis not present

## 2020-09-13 ENCOUNTER — Non-Acute Institutional Stay: Payer: Medicare Other | Admitting: Adult Health

## 2020-09-13 DIAGNOSIS — N3281 Overactive bladder: Secondary | ICD-10-CM

## 2020-09-13 DIAGNOSIS — F02818 Dementia in other diseases classified elsewhere, unspecified severity, with other behavioral disturbance: Secondary | ICD-10-CM

## 2020-09-13 DIAGNOSIS — E7800 Pure hypercholesterolemia, unspecified: Secondary | ICD-10-CM | POA: Diagnosis not present

## 2020-09-13 DIAGNOSIS — G301 Alzheimer's disease with late onset: Secondary | ICD-10-CM | POA: Diagnosis not present

## 2020-09-13 DIAGNOSIS — Z9189 Other specified personal risk factors, not elsewhere classified: Secondary | ICD-10-CM | POA: Diagnosis not present

## 2020-09-13 DIAGNOSIS — Z20828 Contact with and (suspected) exposure to other viral communicable diseases: Secondary | ICD-10-CM | POA: Diagnosis not present

## 2020-09-13 DIAGNOSIS — F5101 Primary insomnia: Secondary | ICD-10-CM

## 2020-09-13 DIAGNOSIS — F0281 Dementia in other diseases classified elsewhere with behavioral disturbance: Secondary | ICD-10-CM | POA: Diagnosis not present

## 2020-09-13 DIAGNOSIS — M8589 Other specified disorders of bone density and structure, multiple sites: Secondary | ICD-10-CM

## 2020-09-13 DIAGNOSIS — Z8542 Personal history of malignant neoplasm of other parts of uterus: Secondary | ICD-10-CM

## 2020-09-14 ENCOUNTER — Encounter: Payer: Self-pay | Admitting: Adult Health

## 2020-09-14 NOTE — Progress Notes (Signed)
Location:  Occupational psychologist of Service:  ALF (13) Provider:   Cindi Carbon, ANP [place] [phone]   Gayland Curry, DO  Patient Care Team: Gayland Curry, DO as PCP - General (Geriatric Medicine) Addison Lank, MD as Consulting Physician (Dermatology)  Extended Emergency Contact Information Primary Emergency Contact: Skilar, Marcou Address: [address] [place], [place] [address] [name] of [place] Phone: [phone] Relation: Son  Code Status:  DNR Goals of care: Advanced Directive information Advanced Directives 05/01/2020  Does Patient Have a Medical Advance Directive? Yes  Type of Paramedic of [place];Living will;Out of facility DNR (pink MOST or yellow form)  Does patient want to make changes to medical advance directive? No - Patient declined  Copy of [place] in Chart? Yes - validated most recent copy scanned in chart (See row information)  Would patient like information on creating a medical advance directive? -  Pre-existing out of facility DNR order (yellow form or pink MOST form) Pink MOST/Yellow Form most recent copy in chart - Physician notified to receive inpatient order     Chief Complaint  Patient presents with  . Medical Management of Chronic Issues    HPI:  Pt is a 86 y.o. female seen today for medical management of chronic diseases.   Karen Mclean has a hx of AD diagnosed in 2016. Most recent MMSE 05/03/20 1/30 with a failed clock. She remains ambulatory and continues to count repetitively. No recent delusions or hallucinations are reported. She tends to be "busy" per the nurse as she tries to play with the med cart, flowers on the unit, etc. She has gained 5 lbs since arrival to the unit and is eating and drinking well. No sleep issues are reported. Noted hx of ostepenia, no longer going out for dexa scans due to age/dementia.  Past Medical History:   Diagnosis Date  . Alzheimer's disease ([place])   . High cholesterol   . Uterine cancer [facility])    Past Surgical History:  Procedure Laterality Date  . [facility]  . cataract surgery Bilateral     Allergies  Allergen Reactions  . Penicillins Hives  . Sulfa Antibiotics Hives    Outpatient Encounter Medications as of 09/13/2020  Medication Sig  . Cholecalciferol (VITAMIN D3) 2000 units TABS Take 1 tablet by mouth daily.  . Melatonin 1 MG TABS Take by mouth at bedtime.  . memantine (NAMENDA XR) 28 MG CP24 24 hr capsule Take 1 capsule (28 mg total) by mouth daily.  . rivastigmine (EXELON) 3 MG capsule Take 1 capsule (3 mg total) by mouth 2 (two) times daily.  . simvastatin (ZOCOR) 20 MG tablet TAKE 1 TABLET DAILY  . Glucosamine-Chondroit-Vit C-Mn (GLUCOSAMINE CHONDR 1500 COMPLX PO) Take 1 tablet by mouth 2 (two) times daily.  . Multiple Vitamins-Minerals (MULTIVITAMIN WITH MINERALS) tablet Take 1 tablet by mouth daily.   No facility-administered encounter medications on file as of 09/13/2020.    Review of Systems  Constitutional: Negative for activity change, appetite change, chills, diaphoresis, fatigue, fever and unexpected weight change.  HENT: Negative for congestion.   Respiratory: Negative for cough, shortness of breath and wheezing.   Cardiovascular: Negative for chest pain, palpitations and leg swelling.  Gastrointestinal: Negative for abdominal distention, abdominal pain, constipation and diarrhea.  Genitourinary: Negative for difficulty urinating and dysuria.  Musculoskeletal: Negative  for arthralgias, back pain, gait problem, joint swelling and myalgias.  Neurological: Negative for dizziness, tremors, seizures, syncope, facial asymmetry, speech difficulty, weakness, light-headedness, numbness and headaches.  Psychiatric/Behavioral: Positive for confusion. Negative for agitation and behavioral problems.    Immunization History    Administered Date(s) Administered  . Influenza, High Dose Seasonal PF 10/07/2019  . Influenza,inj,Quad PF,6+ Mos 10/22/2018  . Influenza-Unspecified 10/31/2015, 10/23/2016, 10/19/2017  . Moderna SARS-COVID-2 Vaccination 01/31/2020, 02/28/2020  . Pneumococcal Conjugate-13 06/16/2018  . Pneumococcal Polysaccharide-23 02/04/2012  . Tdap 10/16/2018  . Zoster Recombinat (Shingrix) 07/10/2017, 11/13/2017, 06/27/2018   Pertinent  Health Maintenance Due  Topic Date Due  . INFLUENZA VACCINE  07/29/2020  . DEXA SCAN  Completed  . PNA vac Low Risk Adult  Completed   Fall Risk  10/24/2019 08/24/2019 04/27/2019 02/23/2019 10/13/2018  Falls in the past year? 1 0 1 1 No  Number falls in past yr: 0 0 0 0 -  Injury with Fall? 1 0 1 1 -  Risk for fall due to : - - - History of fall(s);Impaired balance/gait;Impaired mobility;Mental status change;Medication side effect -  Follow up - - - Falls evaluation completed;Education provided;Falls prevention discussed -  Comment - - - PT referral -   Functional Status Survey:    Vitals:   09/14/20 1604  Weight: 117 lb 12.8 oz (53.4 kg)   Body mass index is 22.26 kg/m. Physical Exam Vitals and nursing note reviewed.  Constitutional:      General: She is not in acute distress.    Appearance: She is not diaphoretic.  HENT:     Head: Normocephalic and atraumatic.     Mouth/Throat:     Mouth: Mucous membranes are moist.     Pharynx: Oropharynx is clear. No oropharyngeal exudate.  Neck:     Vascular: No JVD.  Cardiovascular:     Rate and Rhythm: Normal rate and regular rhythm.     Heart sounds: No murmur heard.   Pulmonary:     Effort: Pulmonary effort is normal. No respiratory distress.     Breath sounds: Normal breath sounds. No wheezing.  Abdominal:     General: Abdomen is flat. Bowel sounds are normal.     Palpations: Abdomen is soft.  Musculoskeletal:     Right lower leg: No edema.     Left lower leg: No edema.  Skin:    General: Skin is  warm and dry.  Neurological:     General: No focal deficit present.     Mental Status: She is alert. Mental status is at baseline.  Psychiatric:        Mood and Affect: Mood normal.     Labs reviewed: Recent Labs    01/09/20 0000  NA 140  K 4.3  CL 102  CO2 29*  BUN 17  CREATININE 0.9  CALCIUM 9.3   No results for input(s): AST, ALT, ALKPHOS, BILITOT, PROT, ALBUMIN in the last 8760 hours. Recent Labs    01/09/20 0000  WBC 4.9  HGB 12.3  HCT 36  PLT 256   Lab Results  Component Value Date   TSH 3.70 02/24/2017   No results found for: HGBA1C Lab Results  Component Value Date   CHOL 138 04/19/2019   HDL 59 04/19/2019   LDLCALC 59 04/19/2019   TRIG 100 04/19/2019    Significant Diagnostic Results in last 30 days:  No results found.  Assessment/Plan  1. Late onset Alzheimer's disease with behavioral disturbance ([facility]) Continue Exelon  patch 3 mg bid and Namenda 28 mg daily. Likely still benefits as she is ambulatory and verbal.   2. Osteopenia of multiple sites Continue Vit D 2000 units daily   3. OAB (overactive bladder) No current symptoms reported  4. History of uterine cancer Noted s/p hysterectomy  5. Pure hypercholesterolemia Lab Results  Component Value Date   LDLCALC 59 04/19/2019   Continue Zocor 20 mg qd. Check lipid panel with next lab draws (Jan)  6. Primary insomnia No current issues sleeping reported.    Family/ staff Communication: discussed with her nurse Danielle  Labs/tests ordered:  NA

## 2020-09-17 DIAGNOSIS — Z9189 Other specified personal risk factors, not elsewhere classified: Secondary | ICD-10-CM | POA: Diagnosis not present

## 2020-09-17 DIAGNOSIS — Z20828 Contact with and (suspected) exposure to other viral communicable diseases: Secondary | ICD-10-CM | POA: Diagnosis not present

## 2020-09-24 DIAGNOSIS — Z9189 Other specified personal risk factors, not elsewhere classified: Secondary | ICD-10-CM | POA: Diagnosis not present

## 2020-09-24 DIAGNOSIS — Z20828 Contact with and (suspected) exposure to other viral communicable diseases: Secondary | ICD-10-CM | POA: Diagnosis not present

## 2020-10-19 DIAGNOSIS — Z23 Encounter for immunization: Secondary | ICD-10-CM | POA: Diagnosis not present

## 2020-10-24 ENCOUNTER — Encounter: Payer: Medicare Other | Admitting: Family

## 2020-11-09 ENCOUNTER — Encounter: Payer: Medicare Other | Admitting: Orthopedic Surgery

## 2020-11-15 ENCOUNTER — Encounter: Payer: Self-pay | Admitting: Adult Health

## 2020-11-15 ENCOUNTER — Non-Acute Institutional Stay: Payer: Medicare Other | Admitting: Adult Health

## 2020-11-15 DIAGNOSIS — F5101 Primary insomnia: Secondary | ICD-10-CM

## 2020-11-15 DIAGNOSIS — R4689 Other symptoms and signs involving appearance and behavior: Secondary | ICD-10-CM

## 2020-11-15 DIAGNOSIS — F02818 Dementia in other diseases classified elsewhere, unspecified severity, with other behavioral disturbance: Secondary | ICD-10-CM

## 2020-11-15 DIAGNOSIS — F0281 Dementia in other diseases classified elsewhere with behavioral disturbance: Secondary | ICD-10-CM | POA: Diagnosis not present

## 2020-11-15 DIAGNOSIS — M8949 Other hypertrophic osteoarthropathy, multiple sites: Secondary | ICD-10-CM | POA: Diagnosis not present

## 2020-11-15 DIAGNOSIS — G301 Alzheimer's disease with late onset: Secondary | ICD-10-CM | POA: Diagnosis not present

## 2020-11-15 DIAGNOSIS — E7800 Pure hypercholesterolemia, unspecified: Secondary | ICD-10-CM

## 2020-11-15 DIAGNOSIS — F22 Delusional disorders: Secondary | ICD-10-CM

## 2020-11-15 DIAGNOSIS — M159 Polyosteoarthritis, unspecified: Secondary | ICD-10-CM

## 2020-11-15 NOTE — Progress Notes (Signed)
Location:  Occupational psychologist of Service:  ALF (13) Provider:   Cindi Carbon, ANP [place] [phone]  Gayland Curry, DO  Patient Care Team: Gayland Curry, DO as PCP - General (Geriatric Medicine) Addison Lank, MD as Consulting Physician (Dermatology)  Extended Emergency Contact Information Primary Emergency Contact: Pearl, Bents Address: [address] [place] [place], [place] [address] [name] of [place] Phone: [phone] Relation: Son  Code Status:  DNR Goals of care: Advanced Directive information Advanced Directives 11/15/2020  Does Patient Have a Medical Advance Directive? Yes  Type of Paramedic of [place];Living will;Out of facility DNR (pink MOST or yellow form)  Does patient want to make changes to medical advance directive? -  Copy of [place] in Chart? -  Would patient like information on creating a medical advance directive? -  Pre-existing out of facility DNR order (yellow form or pink MOST form) -     Chief Complaint  Patient presents with  . Medical Management of Chronic Issues    HPI:  Pt is a 86 y.o. female seen today for medical management of chronic diseases.    Ms. Berk resides in memory care due to progressive AD. She remains ambulatory with a walker and can feed herself but needs cuing and reminders. She wanders throughout the unit. Counts repetitively and has difficulty answering questions. Recently she has been spending occasional nights with her husband. There are no reports of issues with swallowing or appetite. Vitals reviewed in matrix are WNL.    Past Medical History:  Diagnosis Date  . Alzheimer's disease ([place])   . High cholesterol   . Uterine cancer [facility])    Past Surgical History:  Procedure Laterality Date  . [facility]  . cataract surgery Bilateral     Allergies  Allergen Reactions  .  Penicillins Hives  . Sulfa Antibiotics Hives    Outpatient Encounter Medications as of 11/15/2020  Medication Sig  . Cholecalciferol (VITAMIN D3) 2000 units TABS Take 1 tablet by mouth daily.  . Glucosamine-Chondroit-Vit C-Mn (GLUCOSAMINE CHONDR 1500 COMPLX PO) Take 1 tablet by mouth 2 (two) times daily.  . Melatonin 1 MG TABS Take by mouth at bedtime.  . memantine (NAMENDA XR) 28 MG CP24 24 hr capsule Take 1 capsule (28 mg total) by mouth daily.  . Multiple Vitamins-Minerals (MULTIVITAMIN WITH MINERALS) tablet Take 1 tablet by mouth daily.  . rivastigmine (EXELON) 3 MG capsule Take 1 capsule (3 mg total) by mouth 2 (two) times daily.  . simvastatin (ZOCOR) 20 MG tablet TAKE 1 TABLET DAILY   No facility-administered encounter medications on file as of 11/15/2020.    Review of Systems  Unable to perform ROS: Dementia    Immunization History  Administered Date(s) Administered  . Influenza, High Dose Seasonal PF 10/07/2019  . Influenza,inj,Quad PF,6+ Mos 10/22/2018  . Influenza-Unspecified 10/31/2015, 10/23/2016, 10/19/2017, 10/23/2020  . Moderna SARS-COVID-2 Vaccination 01/31/2020, 02/28/2020  . Pneumococcal Conjugate-13 06/16/2018  . Pneumococcal Polysaccharide-23 02/04/2012  . Tdap 10/16/2018  . Zoster Recombinat (Shingrix) 07/10/2017, 11/13/2017, 06/27/2018   Pertinent  Health Maintenance Due  Topic Date Due  . INFLUENZA VACCINE  Completed  . DEXA SCAN  Completed  . PNA vac Low Risk Adult  Completed   Fall Risk  10/24/2019 08/24/2019 04/27/2019 02/23/2019 10/13/2018  Falls in the past year? 1 0 1 1 No  Number falls in past yr: 0 0 0 0 -  Injury with Fall? 1 0 1 1 -  Risk for fall due to : - - - History of fall(s);Impaired balance/gait;Impaired mobility;Mental status change;Medication side effect -  Follow up - - - Falls evaluation completed;Education provided;Falls prevention discussed -  Comment - - - PT referral -   Functional Status Survey:    Vitals:   11/15/20  1654  Weight: 121 lb 6.4 oz (55.1 kg)   Body mass index is 22.94 kg/m. Physical Exam Vitals and nursing note reviewed.  Constitutional:      General: She is not in acute distress.    Appearance: She is not diaphoretic.  HENT:     Head: Normocephalic and atraumatic.     Mouth/Throat:     Mouth: Mucous membranes are moist.     Pharynx: Oropharynx is clear.  Eyes:     Conjunctiva/sclera: Conjunctivae normal.     Pupils: Pupils are equal, round, and reactive to light.  Neck:     Vascular: No carotid bruit or JVD.  Cardiovascular:     Rate and Rhythm: Normal rate and regular rhythm.     Heart sounds: No murmur heard.   Pulmonary:     Effort: Pulmonary effort is normal. No respiratory distress.     Breath sounds: Normal breath sounds. No wheezing.  Abdominal:     General: Abdomen is flat. Bowel sounds are normal.     Palpations: Abdomen is soft.  Musculoskeletal:     Cervical back: No rigidity or tenderness.     Right lower leg: No edema.     Left lower leg: No edema.  Lymphadenopathy:     Cervical: No cervical adenopathy.  Skin:    General: Skin is warm and dry.  Neurological:     General: No focal deficit present.     Mental Status: She is alert. Mental status is at baseline.     Comments: Oriented to self. Counts when asked questions   Psychiatric:        Mood and Affect: Mood normal.     Labs reviewed: Recent Labs    01/09/20 0000  NA 140  K 4.3  CL 102  CO2 29*  BUN 17  CREATININE 0.9  CALCIUM 9.3   No results for input(s): AST, ALT, ALKPHOS, BILITOT, PROT, ALBUMIN in the last 8760 hours. Recent Labs    01/09/20 0000  WBC 4.9  HGB 12.3  HCT 36  PLT 256   Lab Results  Component Value Date   TSH 3.70 02/24/2017   No results found for: HGBA1C Lab Results  Component Value Date   CHOL 138 04/19/2019   HDL 59 04/19/2019   LDLCALC 59 04/19/2019   TRIG 100 04/19/2019    Significant Diagnostic Results in last 30 days:  No results  found.  Assessment/Plan   1. Dementia of the Alzheimer's type, with late onset, with delusions ([place]) Not able to complete MMSE testing. Moderate to severe cognitively but very functional  Continue Namenda 28 mg daily Continue Exelon 3 mg bid   2. Wandering behavior Continues to benefit from the memory care unit   3. Pure hypercholesterolemia Lab Results  Component Value Date   LDLCALC 59 04/19/2019  Will recheck with labs in Jan  Continue Zocor 20 mg qd    4. Primary insomnia No reports of difficulty sleeping Takes melatonin 1 mg qhs   5. Primary osteoarthritis involving multiple joints No reports of joint  pain.  Continue glucosamine   Family/ staff Communication: discussed with her nurse Andee Poles   Labs/tests ordered:  Due in Jan

## 2020-12-06 ENCOUNTER — Telehealth: Payer: Self-pay

## 2020-12-06 NOTE — Telephone Encounter (Deleted)
Called to schedule AWV patient informed me that patient is in memory care at [facility]

## 2020-12-06 NOTE — Telephone Encounter (Addendum)
-----   Message from Lauree Chandler, NP sent at 12/06/2020  3:20 PM EST ----- Due for AWV at this time [facility] resident    Called to schedule AWV and was informed by husband that patient is in Memory care at [facility].

## 2020-12-07 NOTE — Telephone Encounter (Signed)
Erin from [facility] called. Husband wants to help do an AWV for patient who is in memory care. I told Junie Panning that it was not likely that we would do an AWV for this patient but that I would check and let her know ( [email or web address]).

## 2021-01-03 ENCOUNTER — Encounter: Payer: Self-pay | Admitting: Adult Health

## 2021-01-03 ENCOUNTER — Non-Acute Institutional Stay: Payer: Medicare Other | Admitting: Adult Health

## 2021-01-03 DIAGNOSIS — M8949 Other hypertrophic osteoarthropathy, multiple sites: Secondary | ICD-10-CM | POA: Diagnosis not present

## 2021-01-03 DIAGNOSIS — F0281 Dementia in other diseases classified elsewhere with behavioral disturbance: Secondary | ICD-10-CM

## 2021-01-03 DIAGNOSIS — G301 Alzheimer's disease with late onset: Secondary | ICD-10-CM | POA: Diagnosis not present

## 2021-01-03 DIAGNOSIS — F02818 Dementia in other diseases classified elsewhere, unspecified severity, with other behavioral disturbance: Secondary | ICD-10-CM

## 2021-01-03 DIAGNOSIS — R4689 Other symptoms and signs involving appearance and behavior: Secondary | ICD-10-CM | POA: Insufficient documentation

## 2021-01-03 DIAGNOSIS — F22 Delusional disorders: Secondary | ICD-10-CM

## 2021-01-03 DIAGNOSIS — E7800 Pure hypercholesterolemia, unspecified: Secondary | ICD-10-CM | POA: Diagnosis not present

## 2021-01-03 DIAGNOSIS — M159 Polyosteoarthritis, unspecified: Secondary | ICD-10-CM

## 2021-01-03 NOTE — Progress Notes (Signed)
Location:      Place of Service:    Provider:   Peggye Ley, ANP [facility] [phone]  Kermit Balo, DO  Patient Care Team: Kermit Balo, DO as PCP - General (Geriatric Medicine) Girard Cooter, MD as Consulting Physician (Dermatology)  Extended Emergency Contact Information Primary Emergency Contact: [place], [place] Address: [address] [place], [place] [address] [place] of [place] Home Phone: [phone] Relation: Son  Code Status:  DNR Goals of care: Advanced Directive information Advanced Directives 11/15/2020  Does Patient Have a Medical Advance Directive? Yes  Type of Estate agent of [place];Living will;Out of facility DNR (pink MOST or yellow form)  Does patient want to make changes to medical advance directive? -  Copy of Healthcare Power of Attorney in Chart? -  Would patient like information on creating a medical advance directive? -  Pre-existing out of facility DNR order (yellow form or pink MOST form) -     No chief complaint on file.   HPI:  Pt is a 87 y.o. female seen today for medical management of chronic diseases.    Ms. Foulk resides in memory care due to progressive AD. She remains ambulatory with a walker and can feed herself but needs cuing and reminders. She wanders throughout the unit. Counts repetitively and has difficulty answering questions. Recently she has been spending occasional nights with her husband. There are no reports of issues with swallowing or appetite. Vitals reviewed in matrix are WNL.    Past Medical History:  Diagnosis Date  . Alzheimer's disease (HCC)   . High cholesterol   . Uterine cancer [facility])    Past Surgical History:  Procedure Laterality Date  . ABDOMINAL HYSTERECTOMY  [facility]  . cataract surgery Bilateral     Allergies  Allergen Reactions  . Penicillins Hives  . Sulfa Antibiotics Hives    Outpatient Encounter Medications as of  01/03/2021  Medication Sig  . Cholecalciferol (VITAMIN D3) 2000 units TABS Take 1 tablet by mouth daily.  . Glucosamine-Chondroit-Vit C-Mn (GLUCOSAMINE CHONDR 1500 COMPLX PO) Take 1 tablet by mouth 2 (two) times daily.  . Melatonin 1 MG TABS Take by mouth at bedtime.  . memantine (NAMENDA XR) 28 MG CP24 24 hr capsule Take 1 capsule (28 mg total) by mouth daily.  . Multiple Vitamins-Minerals (MULTIVITAMIN WITH MINERALS) tablet Take 1 tablet by mouth daily.  . rivastigmine (EXELON) 3 MG capsule Take 1 capsule (3 mg total) by mouth 2 (two) times daily.  . simvastatin (ZOCOR) 20 MG tablet TAKE 1 TABLET DAILY   No facility-administered encounter medications on file as of 01/03/2021.    Review of Systems  Unable to perform ROS: Dementia    Immunization History  Administered Date(s) Administered  . Influenza, High Dose Seasonal PF 10/07/2019  . Influenza,inj,Quad PF,6+ Mos 10/22/2018  . Influenza-Unspecified 10/31/2015, 10/23/2016, 10/19/2017, 10/23/2020  . Moderna Sars-Covid-2 Vaccination 01/31/2020, 02/28/2020, 10/16/2020  . Pneumococcal Conjugate-13 06/16/2018  . Pneumococcal Polysaccharide-23 02/04/2012  . Tdap 10/16/2018  . Zoster Recombinat (Shingrix) 07/10/2017, 11/13/2017, 06/27/2018   Pertinent  Health Maintenance Due  Topic Date Due  . INFLUENZA VACCINE  Completed  . DEXA SCAN  Completed  . PNA vac Low Risk Adult  Completed   Fall Risk  10/24/2019 08/24/2019 04/27/2019 02/23/2019 10/13/2018  Falls in the past year? 1 0 1 1 No  Number falls in past yr: 0 0 0 0 -  Injury with Fall? 1 0 1 1 -  Risk for fall due to : - - - History of fall(s);Impaired balance/gait;Impaired mobility;Mental status change;Medication side effect -  Follow up - - - Falls evaluation completed;Education provided;Falls prevention discussed -  Comment - - - PT referral -   Functional Status Survey:    There were no vitals filed for this visit. There is no height or weight on file to calculate  BMI. Physical Exam Vitals and nursing note reviewed.  Constitutional:      General: She is not in acute distress.    Appearance: She is not diaphoretic.  HENT:     Head: Normocephalic and atraumatic.     Mouth/Throat:     Mouth: Mucous membranes are moist.     Pharynx: Oropharynx is clear.  Eyes:     Conjunctiva/sclera: Conjunctivae normal.     Pupils: Pupils are equal, round, and reactive to light.  Neck:     Vascular: No carotid bruit or JVD.  Cardiovascular:     Rate and Rhythm: Normal rate and regular rhythm.     Heart sounds: No murmur heard.   Pulmonary:     Effort: Pulmonary effort is normal. No respiratory distress.     Breath sounds: Normal breath sounds. No wheezing.  Abdominal:     General: Abdomen is flat. Bowel sounds are normal.     Palpations: Abdomen is soft.  Musculoskeletal:     Cervical back: No rigidity or tenderness.     Right lower leg: No edema.     Left lower leg: No edema.  Lymphadenopathy:     Cervical: No cervical adenopathy.  Skin:    General: Skin is warm and dry.  Neurological:     General: No focal deficit present.     Mental Status: She is alert. Mental status is at baseline.     Comments: Oriented to self only. MAE  Psychiatric:        Mood and Affect: Mood normal.     Labs reviewed: Recent Labs    01/09/20 0000  NA 140  K 4.3  CL 102  CO2 29*  BUN 17  CREATININE 0.9  CALCIUM 9.3   No results for input(s): AST, ALT, ALKPHOS, BILITOT, PROT, ALBUMIN in the last 8760 hours. Recent Labs    01/09/20 0000  WBC 4.9  HGB 12.3  HCT 36  PLT 256   Lab Results  Component Value Date   TSH 3.70 02/24/2017   No results found for: HGBA1C Lab Results  Component Value Date   CHOL 138 04/19/2019   HDL 59 04/19/2019   LDLCALC 59 04/19/2019   TRIG 100 04/19/2019    Significant Diagnostic Results in last 30 days:  No results found.  Assessment/Plan   1. Dementia of the Alzheimer's type, with late onset, with delusions  ([place]) Continues on Exelon 3 mg bid and Namenda 28 mg XR qd Continues to benefit from this regimen as he remains ambulatory and able to participate in activities  2. Primary osteoarthritis involving multiple joints No current complaints.  Continue Glucosamine, remains active  3. Pure hypercholesterolemia Needs lipid panel, currently on Zocor   4. Wandering behavior Continues to remain ambulatory and benefits from the memory care environment    Labs/tests ordered:  CBC BMP Lipids

## 2021-01-07 DIAGNOSIS — E785 Hyperlipidemia, unspecified: Secondary | ICD-10-CM | POA: Diagnosis not present

## 2021-01-07 DIAGNOSIS — G309 Alzheimer's disease, unspecified: Secondary | ICD-10-CM | POA: Diagnosis not present

## 2021-01-07 LAB — CBC AND DIFFERENTIAL
HCT: 37 (ref 36–46)
Hemoglobin: 13 (ref 12.0–16.0)
Platelets: 202 (ref 150–399)
WBC: 5.3

## 2021-01-07 LAB — LIPID PANEL
Cholesterol: 152 (ref 0–200)
HDL: 48 (ref 35–70)
LDL Cholesterol: 85
Triglycerides: 96 (ref 40–160)

## 2021-01-07 LAB — COMPREHENSIVE METABOLIC PANEL: Calcium: 9.6 (ref 8.7–10.7)

## 2021-01-07 LAB — BASIC METABOLIC PANEL
BUN: 21 (ref 4–21)
CO2: 26 — AB (ref 13–22)
Chloride: 108 (ref 99–108)
Creatinine: 0.8 (ref 0.5–1.1)
Glucose: 91
Potassium: 4.4 (ref 3.4–5.3)
Sodium: 145 (ref 137–147)

## 2021-01-07 LAB — CBC: RBC: 4.05 (ref 3.87–5.11)

## 2021-01-28 DIAGNOSIS — Z961 Presence of intraocular lens: Secondary | ICD-10-CM | POA: Diagnosis not present

## 2021-02-18 ENCOUNTER — Encounter: Payer: Self-pay | Admitting: Internal Medicine

## 2021-03-08 ENCOUNTER — Other Ambulatory Visit: Payer: Self-pay

## 2021-03-08 ENCOUNTER — Ambulatory Visit (INDEPENDENT_AMBULATORY_CARE_PROVIDER_SITE_OTHER): Payer: Medicare Other | Admitting: Nurse Practitioner

## 2021-03-08 DIAGNOSIS — E2839 Other primary ovarian failure: Secondary | ICD-10-CM

## 2021-03-08 DIAGNOSIS — Z0000 Encounter for general adult medical examination without abnormal findings: Secondary | ICD-10-CM

## 2021-03-08 NOTE — Progress Notes (Signed)
This service is provided via telemedicine  No vital signs collected/recorded due to the encounter was a telemedicine visit.   Location of patient (ex: home, work): [facility]  Patient consents to a telephone visit:  Yes, husband consents for patient See encounter dated 03/08/2021  Location of the provider (ex: office, home): [facility]  Name of any referring provider:  Hollace Kinnier, DO  Names of all persons participating in the telemedicine service and their role in the encounter:  Sherrie Mustache, Nurse Practitioner, Carroll Kinds, CMA, patient and Husband Iona Beard.  Time spent on call: 15 minutes with medical assistant

## 2021-03-08 NOTE — Progress Notes (Signed)
Subjective:   Karen Mclean is a 87 y.o. female who presents for Medicare Annual (Subsequent) preventive examination.  Review of Systems     Cardiac Risk Factors include: advanced age (>95men, >63 women);dyslipidemia;sedentary lifestyle     Objective:    There were no vitals filed for this visit. There is no height or weight on file to calculate BMI.  Advanced Directives 03/08/2021 11/15/2020 05/01/2020 01/24/2020 10/24/2019 04/27/2019 02/13/2019  Does Patient Have a Medical Advance Directive? Yes Yes Yes Yes Yes Yes No  Type of Paramedic of [place];Living will [place];Living will;Out of facility DNR (pink MOST or yellow form) [place];Living will;Out of facility DNR (pink MOST or yellow form) Out of facility DNR (pink MOST or yellow form) [place];Living will;Out of facility DNR (pink MOST or yellow form) [place];Living will -  Does patient want to make changes to medical advance directive? No - Patient declined - No - Patient declined No - Patient declined No - Patient declined No - Patient declined -  Copy of [place] in Chart? Yes - validated most recent copy scanned in chart (See row information) - Yes - validated most recent copy scanned in chart (See row information) - Yes - validated most recent copy scanned in chart (See row information) Yes - validated most recent copy scanned in chart (See row information) -  Would patient like information on creating a medical advance directive? - - - - - - No - Patient declined  Pre-existing out of facility DNR order (yellow form or pink MOST form) - - Pink MOST/Yellow Form most recent copy in chart - Physician notified to receive inpatient order - - - -    Current Medications (verified) Outpatient Encounter Medications as of 03/08/2021  Medication Sig  . Cholecalciferol (VITAMIN D3) 2000 units TABS Take 1 tablet by  mouth daily.  . Glucosamine-Chondroit-Vit C-Mn (GLUCOSAMINE CHONDR 1500 COMPLX PO) Take 1 tablet by mouth 2 (two) times daily.  . Melatonin 1 MG TABS Take by mouth at bedtime.  . memantine (NAMENDA XR) 28 MG CP24 24 hr capsule Take 1 capsule (28 mg total) by mouth daily.  . Multiple Vitamins-Minerals (MULTIVITAMIN WITH MINERALS) tablet Take 1 tablet by mouth daily.  . rivastigmine (EXELON) 3 MG capsule Take 1 capsule (3 mg total) by mouth 2 (two) times daily.  . simvastatin (ZOCOR) 20 MG tablet TAKE 1 TABLET DAILY   No facility-administered encounter medications on file as of 03/08/2021.    Allergies (verified) Penicillins and Sulfa antibiotics   History: Past Medical History:  Diagnosis Date  . Alzheimer's disease ([place])   . High cholesterol   . Uterine cancer [facility])    Past Surgical History:  Procedure Laterality Date  . [facility]  . cataract surgery Bilateral    Family History  Problem Relation Age of Onset  . Suicidality Brother   . Heart failure Brother   . Leukemia Sister    Social History   Socioeconomic History  . Marital status: Married    Spouse name: Not on file  . Number of children: 2  . Years of education: Not on file  . Highest education level: Not on file  Occupational History  . Occupation: Education officer, museum  Tobacco Use  . Smoking status: Never Smoker  . Smokeless tobacco: Former Network engineer  . Vaping Use: Never used  Substance and  Sexual Activity  . Alcohol use: Not Currently    Alcohol/week: 2.0 - 3.0 standard drinks    Types: 1 Glasses of wine, 1 - 2 Standard drinks or equivalent per week    Comment: 1/2 glass of wine a night  . Drug use: No  . Sexual activity: Not on file  Other Topics Concern  . Not on file  Social History Narrative   Diet? No      Do you drink/eat things with caffeine?  No      Marital status?     Married                               What year were you married?  1960      Do you  live in a house, apartment, assisted living, condo, trailer, etc.?  [place]      Is it one or more stories? One      How many persons live in your home? 2      Do you have any pets in your home? (please list) No      Current or past profession:  Retired Education officer, museum      Do you exercise?               yes                       Type & how often?  Walk daily      Do you have a living will? no      Do you have a DNR form?     no                             If not, do you want to discuss one? yes      Do you have signed POA/HPOA for forms? yes      Social Determinants of Health   Financial Resource Strain: Not on file  Food Insecurity: Not on file  Transportation Needs: Not on file  Physical Activity: Not on file  Stress: Not on file  Social Connections: Not on file    Tobacco Counseling Counseling given: Not Answered   Clinical Intake:  Pre-visit preparation completed: Yes  Pain : No/denies pain     BMI - recorded: 22 Nutritional Status: BMI of 19-24  Normal Nutritional Risks: None Diabetes: No  How often do you need to have someone help you when you read instructions, pamphlets, or other written materials from your doctor or pharmacy?: 5 - [place]        Activities of Daily Living In your present state of health, do you have any difficulty performing the following activities: 03/08/2021  Hearing? N  Vision? N  Difficulty concentrating or making decisions? Y  Walking or climbing stairs? Y  Dressing or bathing? Y  Doing errands, shopping? Y  Preparing Food and eating ? Y  Using the Toilet? Y  In the past six months, have you accidently leaked urine? Y  Do you have problems with loss of bowel control? Y  Managing your Medications? Y  Managing your Finances? Y  Housekeeping or managing your Housekeeping? Y  Some recent data might be hidden    Patient Care Team: Gayland Curry, DO as PCP - General (Geriatric  Medicine) Addison Lank, MD as Consulting Physician (Dermatology)  Indicate any recent Medical Services you may have received from other than [facility] providers in the past year (date may be approximate).     Assessment:   This is a routine wellness examination for [place].  Hearing/Vision screen  Hearing Screening   125Hz  250Hz  500Hz  1000Hz  2000Hz  3000Hz  4000Hz  6000Hz  8000Hz   Right ear:           Left ear:           Comments: Patient has no hearing problems.  Vision Screening Comments: Patient has no vision problems. Patient had yearly eye exam. Patient sees Dr. Ellie Lunch  Dietary issues and exercise activities discussed: Current Exercise Habits: The patient does not participate in regular exercise at present, Exercise limited by: neurologic condition(s)  Goals    . Maintain Lifestyle      Depression Screen PHQ 2/9 Scores 03/08/2021 10/24/2019 08/24/2019 04/27/2019 02/23/2019 10/13/2018 07/06/2018  PHQ - 2 Score 0 0 0 0 0 0 0    Fall Risk Fall Risk  03/08/2021 10/24/2019 08/24/2019 04/27/2019 02/23/2019  Falls in the past year? 0 1 0 1 1  Number falls in past yr: 0 0 0 0 0  Injury with Fall? 0 1 0 1 1  Risk for fall due to : - - - - History of fall(s);Impaired balance/gait;Impaired mobility;Mental status change;Medication side effect  Follow up - - - - Falls evaluation completed;Education provided;Falls prevention discussed  Comment - - - - PT referral    FALL RISK PREVENTION PERTAINING TO THE HOME:  Any stairs in or around the home? No  If so, are there any without handrails? No  Home free of loose throw rugs in walkways, pet beds, electrical cords, etc? Yes  Adequate lighting in your home to reduce risk of falls? Yes   ASSISTIVE DEVICES UTILIZED TO PREVENT FALLS:  Life alert? No  Use of a cane, walker or w/c? No  Grab bars in the bathroom? Yes  Shower chair or bench in shower? Yes  Elevated toilet seat or a handicapped toilet? Yes   TIMED UP AND GO:  Was the test performed? No  .    Cognitive Function: MMSE - Mini Mental State Exam 09/03/2018 02/23/2018 08/24/2017 02/23/2017 02/23/2017  Orientation to time 0 0 1 0 0  Orientation to Place 1 1 4 3 3   Registration 3 3 3 3 3   Attention/ Calculation 0 0 0 0 0  Recall 0 0 0 0 0  Language- name 2 objects 1 1 2 2 2   Language- repeat 1 1 1 1 1   Language- follow 3 step command 3 2 3 3 3   Language- read & follow direction 1 1 1 1 1   Write a sentence 1 1 1 1 1   Copy design 0 0 0 1 1  Total score 11 10 16 15 15      6CIT Screen 10/24/2019  What Year? (No Data)  What month? (No Data)  What time? (No Data)  Count back from 20 (No Data)  Months in reverse 4 points  Repeat phrase (No Data)    Immunizations Immunization History  Administered Date(s) Administered  . Influenza, High Dose Seasonal PF 10/07/2019  . Influenza,inj,Quad PF,6+ Mos 10/22/2018  . Influenza-Unspecified 10/31/2015, 10/23/2016, 10/19/2017, 10/23/2020  . Moderna Sars-Covid-2 Vaccination 01/31/2020, 02/28/2020, 10/16/2020  . Pneumococcal Conjugate-13 06/16/2018  . Pneumococcal Polysaccharide-23 02/04/2012  . Tdap 10/16/2018  . Zoster Recombinat (Shingrix) 07/10/2017, 11/13/2017, 06/27/2018    TDAP status: Up to date  Flu Vaccine status: Up to  date  Pneumococcal vaccine status: Up to date  Covid-19 vaccine status: Completed vaccines  Qualifies for Shingles Vaccine? Yes   Zostavax completed No   Shingrix Completed?: Yes  Screening Tests Health Maintenance  Topic Date Due  . TETANUS/TDAP  10/16/2028  . INFLUENZA VACCINE  Completed  . DEXA SCAN  Completed  . COVID-19 Vaccine  Completed  . PNA vac Low Risk Adult  Completed  . HPV VACCINES  Aged Out    Health Maintenance  There are no preventive care reminders to display for this patient.  Colorectal cancer screening: No longer required.   Mammogram status: No longer required due to age.  Bone Density status: Ordered today. Pt provided with contact info and advised to call to  schedule appt.  Lung Cancer Screening: (Low Dose CT Chest recommended if Age 54-80 years, 30 pack-year currently smoking OR have quit w/in 15years.) does not qualify.    Additional Screening:  Hepatitis C Screening: does not qualify;   Vision Screening: Recommended annual ophthalmology exams for early detection of glaucoma and other disorders of the eye. Is the patient up to date with their annual eye exam?  Yes  Who is the provider or what is the name of the office in which the patient attends annual eye exams? [facility] If pt is not established with a provider, would they like to be referred to a provider to establish care? No .   Dental Screening: Recommended annual dental exams for proper oral hygiene  Community Resource Referral / Chronic Care Management: CRR required this visit?  No   CCM required this visit?  No      Plan:     I have personally reviewed and noted the following in the patient's chart:   . Medical and social history . Use of alcohol, tobacco or illicit drugs  . Current medications and supplements . Functional ability and status . Nutritional status . Physical activity . Advanced directives . List of other physicians . Hospitalizations, surgeries, and ER visits in previous 12 months . Vitals . Screenings to include cognitive, depression, and falls . Referrals and appointments  In addition, I have reviewed and discussed with patient certain preventive protocols, quality metrics, and best practice recommendations. A written personalized care plan for preventive services as well as general preventive health recommendations were provided to patient.     Lauree Chandler, NP   03/08/2021    Virtual Visit via Telephone Note  I connected with@ on 03/08/21 at  3:45 PM EST by telephone and verified that I am speaking with the correct person using two identifiers.  Location: Patient: home Provider: [place]   I discussed the limitations, risks, security and  privacy concerns of performing an evaluation and management service by telephone and the availability of in person appointments. I also discussed with the patient that there may be a patient responsible charge related to this service. The patient expressed understanding and agreed to proceed.   I discussed the assessment and treatment plan with the patient. The patient was provided an opportunity to ask questions and all were answered. The patient agreed with the plan and demonstrated an understanding of the instructions.   The patient was advised to call back or seek an in-person evaluation if the symptoms worsen or if the condition fails to improve as anticipated.  I provided 15 minutes of non-face-to-face time during this encounter.  Carlos American. Harle Battiest Avs printed and mailed

## 2021-03-08 NOTE — Patient Instructions (Signed)
Karen Mclean , Thank you for taking time to come for your Medicare Wellness Visit. I appreciate your ongoing commitment to your health goals. Please review the following plan we discussed and let me know if I can assist you in the future.   Screening recommendations/referrals: Colonoscopy aged out Mammogram aged out Bone Density recommended- to call [phone] to schedule  Recommended yearly ophthalmology/optometry visit for glaucoma screening and checkup Recommended yearly dental visit for hygiene and checkup  Vaccinations: Influenza vaccine up to date Pneumococcal vaccine up to date Tdap vaccine- up to date Shingles vaccine up to date    Advanced directives: on file.   Conditions/risks identified: advance age, progressive memory loss.   Next appointment: 1 year    Preventive Care 60 Years and Older, Female Preventive care refers to lifestyle choices and visits with your health care provider that can promote health and wellness. What does preventive care include?  A yearly physical exam. This is also called an annual well check.  Dental exams once or twice a year.  Routine eye exams. Ask your health care provider how often you should have your eyes checked.  Personal lifestyle choices, including:  Daily care of your teeth and gums.  Regular physical activity.  Eating a healthy diet.  Avoiding tobacco and drug use.  Limiting alcohol use.  Practicing safe sex.  Taking low-dose aspirin every day.  Taking vitamin and mineral supplements as recommended by your health care provider. What happens during an annual well check? The services and screenings done by your health care provider during your annual well check will depend on your age, overall health, lifestyle risk factors, and family history of disease. Counseling  Your health care provider may ask you questions about your:  Alcohol use.  Tobacco use.  Drug use.  Emotional well-being.  Home and  relationship well-being.  Sexual activity.  Eating habits.  History of falls.  Memory and ability to understand (cognition).  Work and work Statistician.  Reproductive health. Screening  You may have the following tests or measurements:  Height, weight, and BMI.  Blood pressure.  Lipid and cholesterol levels. These may be checked every 5 years, or more frequently if you are over 48 years old.  Skin check.  Lung cancer screening. You may have this screening every year starting at age 20 if you have a 30-pack-year history of smoking and currently smoke or have quit within the past 15 years.  Fecal occult blood test (FOBT) of the stool. You may have this test every year starting at age 38.  Flexible sigmoidoscopy or colonoscopy. You may have a sigmoidoscopy every 5 years or a colonoscopy every 10 years starting at age 28.  Hepatitis C blood test.  Hepatitis B blood test.  Sexually transmitted disease (STD) testing.  Diabetes screening. This is done by checking your blood sugar (glucose) after you have not eaten for a while (fasting). You may have this done every 1-3 years.  Bone density scan. This is done to screen for osteoporosis. You may have this done starting at age 45.  Mammogram. This may be done every 1-2 years. Talk to your health care provider about how often you should have regular mammograms. Talk with your health care provider about your test results, treatment options, and if necessary, the need for more tests. Vaccines  Your health care provider may recommend certain vaccines, such as:  Influenza vaccine. This is recommended every year.  Tetanus, diphtheria, and acellular pertussis (Tdap, Td) vaccine. You  may need a Td booster every 10 years.  Zoster vaccine. You may need this after age 50.  Pneumococcal 13-valent conjugate (PCV13) vaccine. One dose is recommended after age 56.  Pneumococcal polysaccharide (PPSV23) vaccine. One dose is recommended after  age 33. Talk to your health care provider about which screenings and vaccines you need and how often you need them. This information is not intended to replace advice given to you by your health care provider. Make sure you discuss any questions you have with your health care provider. Document Released: 01/11/2016 Document Revised: 09/03/2016 Document Reviewed: 10/16/2015 Elsevier Interactive Patient Education  2017 [place] Prevention in the Home Falls can cause injuries. They can happen to people of all ages. There are many things you can do to make your home safe and to help prevent falls. What can I do on the outside of my home?  Regularly fix the edges of walkways and driveways and fix any cracks.  Remove anything that might make you trip as you walk through a door, such as a raised step or threshold.  Trim any bushes or trees on the path to your home.  Use bright outdoor lighting.  Clear any walking paths of anything that might make someone trip, such as rocks or tools.  Regularly check to see if handrails are loose or broken. Make sure that both sides of any steps have handrails.  Any raised decks and porches should have guardrails on the edges.  Have any leaves, snow, or ice cleared regularly.  Use sand or salt on walking paths during winter.  Clean up any spills in your garage right away. This includes oil or grease spills. What can I do in the bathroom?  Use night lights.  Install grab bars by the toilet and in the tub and shower. Do not use towel bars as grab bars.  Use non-skid mats or decals in the tub or shower.  If you need to sit down in the shower, use a plastic, non-slip stool.  Keep the floor dry. Clean up any water that spills on the floor as soon as it happens.  Remove soap buildup in the tub or shower regularly.  Attach bath mats securely with double-sided non-slip rug tape.  Do not have throw rugs and other things on the floor that can  make you trip. What can I do in the bedroom?  Use night lights.  Make sure that you have a light by your bed that is easy to reach.  Do not use any sheets or blankets that are too big for your bed. They should not hang down onto the floor.  Have a firm chair that has side arms. You can use this for support while you get dressed.  Do not have throw rugs and other things on the floor that can make you trip. What can I do in the kitchen?  Clean up any spills right away.  Avoid walking on wet floors.  Keep items that you use a lot in easy-to-reach places.  If you need to reach something above you, use a strong step stool that has a grab bar.  Keep electrical cords out of the way.  Do not use floor polish or wax that makes floors slippery. If you must use wax, use non-skid floor wax.  Do not have throw rugs and other things on the floor that can make you trip. What can I do with my stairs?  Do not leave any items  on the stairs.  Make sure that there are handrails on both sides of the stairs and use them. Fix handrails that are broken or loose. Make sure that handrails are as long as the stairways.  Check any carpeting to make sure that it is firmly attached to the stairs. Fix any carpet that is loose or worn.  Avoid having throw rugs at the top or bottom of the stairs. If you do have throw rugs, attach them to the floor with carpet tape.  Make sure that you have a light switch at the top of the stairs and the bottom of the stairs. If you do not have them, ask someone to add them for you. What else can I do to help prevent falls?  Wear shoes that:  Do not have high heels.  Have rubber bottoms.  Are comfortable and fit you well.  Are closed at the toe. Do not wear sandals.  If you use a stepladder:  Make sure that it is fully opened. Do not climb a closed stepladder.  Make sure that both sides of the stepladder are locked into place.  Ask someone to hold it for you,  if possible.  Clearly mark and make sure that you can see:  Any grab bars or handrails.  First and last steps.  Where the edge of each step is.  Use tools that help you move around (mobility aids) if they are needed. These include:  Canes.  Walkers.  Scooters.  Crutches.  Turn on the lights when you go into a dark area. Replace any light bulbs as soon as they burn out.  Set up your furniture so you have a clear path. Avoid moving your furniture around.  If any of your floors are uneven, fix them.  If there are any pets around you, be aware of where they are.  Review your medicines with your doctor. Some medicines can make you feel dizzy. This can increase your chance of falling. Ask your doctor what other things that you can do to help prevent falls. This information is not intended to replace advice given to you by your health care provider. Make sure you discuss any questions you have with your health care provider. Document Released: 10/11/2009 Document Revised: 05/22/2016 Document Reviewed: 01/19/2015 Elsevier Interactive Patient Education  2017 [employer].

## 2021-03-25 ENCOUNTER — Non-Acute Institutional Stay: Payer: Medicare Other | Admitting: Adult Health

## 2021-03-25 ENCOUNTER — Encounter: Payer: Self-pay | Admitting: Adult Health

## 2021-03-25 DIAGNOSIS — M8589 Other specified disorders of bone density and structure, multiple sites: Secondary | ICD-10-CM | POA: Diagnosis not present

## 2021-03-25 DIAGNOSIS — F22 Delusional disorders: Secondary | ICD-10-CM

## 2021-03-25 DIAGNOSIS — M8949 Other hypertrophic osteoarthropathy, multiple sites: Secondary | ICD-10-CM | POA: Diagnosis not present

## 2021-03-25 DIAGNOSIS — E7800 Pure hypercholesterolemia, unspecified: Secondary | ICD-10-CM

## 2021-03-25 DIAGNOSIS — M159 Polyosteoarthritis, unspecified: Secondary | ICD-10-CM

## 2021-03-25 DIAGNOSIS — F0281 Dementia in other diseases classified elsewhere with behavioral disturbance: Secondary | ICD-10-CM

## 2021-03-25 DIAGNOSIS — F02818 Dementia in other diseases classified elsewhere, unspecified severity, with other behavioral disturbance: Secondary | ICD-10-CM

## 2021-03-25 DIAGNOSIS — G301 Alzheimer's disease with late onset: Secondary | ICD-10-CM | POA: Diagnosis not present

## 2021-03-25 NOTE — Progress Notes (Signed)
Location:  Occupational psychologist of Service:  ALF [phone]) Provider:  Earney Hamburg, MD  Patient Care Team: Virgie Dad, MD as PCP - General (Internal Medicine) Addison Lank, MD as Consulting Physician (Dermatology)  Extended Emergency Contact Information Primary Emergency Contact: Puls,[name] Address: [address] [place], [place] [address] [name] of [place] Phone: [phone] Relation: Son  Code Status:  DNR Goals of care: Advanced Directive information Advanced Directives 03/08/2021  Does Patient Have a Medical Advance Directive? Yes  Type of Paramedic of [place];Living will  Does patient want to make changes to medical advance directive? No - Patient declined  Copy of [place] in Chart? Yes - validated most recent copy scanned in chart (See row information)  Would patient like information on creating a medical advance directive? -  Pre-existing out of facility DNR order (yellow form or pink MOST form) -     Chief Complaint  Patient presents with  . Medical Management of Chronic Issues    Medical Management of Chronic Issues    HPI:  Pt is a 87 y.o. female seen today for medical management of chronic diseases.    Resides in the memory care setting due to AD Last CT reviewed in 2020 showed chronic small vessel disease and brain atrophy She remains ambulatory and wanders throughout the unit. Has delusions but no aggressive or bothersome behaviors.  Remains continent and participatory She makes repetitive counting statements and has difficulty completing MMSE Past Medical History:  Diagnosis Date  . Alzheimer's disease ([place])   . High cholesterol   . Uterine cancer [facility])    Past Surgical History:  Procedure Laterality Date  . [facility]  . cataract surgery Bilateral     Allergies  Allergen Reactions  .  Penicillins Hives  . Sulfa Antibiotics Hives    Outpatient Encounter Medications as of 03/25/2021  Medication Sig  . Cholecalciferol (VITAMIN D3) 2000 units TABS Take 1 tablet by mouth daily.  . Glucosamine-Chondroit-Vit C-Mn (GLUCOSAMINE CHONDR 1500 COMPLX PO) Take 1 tablet by mouth 2 (two) times daily.  . Melatonin 1 MG TABS Take by mouth at bedtime.  . memantine (NAMENDA XR) 28 MG CP24 24 hr capsule Take 1 capsule (28 mg total) by mouth daily.  . Multiple Vitamins-Minerals (MULTIVITAMIN WITH MINERALS) tablet Take 1 tablet by mouth daily.  . rivastigmine (EXELON) 3 MG capsule Take 1 capsule (3 mg total) by mouth 2 (two) times daily.  . simvastatin (ZOCOR) 20 MG tablet TAKE 1 TABLET DAILY   No facility-administered encounter medications on file as of 03/25/2021.    Review of Systems  Constitutional: Negative for activity change, appetite change, chills, diaphoresis, fatigue, fever and unexpected weight change.  HENT: Negative for congestion.   Respiratory: Negative for cough, shortness of breath and wheezing.   Cardiovascular: Negative for chest pain, palpitations and leg swelling.  Gastrointestinal: Negative for abdominal distention, abdominal pain, constipation and diarrhea.  Genitourinary: Negative for difficulty urinating and dysuria.  Musculoskeletal: Negative for arthralgias, back pain, gait problem, joint swelling and myalgias.  Neurological: Negative for dizziness, tremors, seizures, syncope, facial asymmetry, speech difficulty, weakness, light-headedness, numbness and headaches.  Psychiatric/Behavioral: Positive for confusion. Negative for agitation, behavioral problems and sleep disturbance.       Counts repetitively    Immunization History  Administered Date(s) Administered  . Influenza,  High Dose Seasonal PF 10/07/2019  . Influenza,inj,Quad PF,6+ Mos 10/22/2018  . Influenza-Unspecified 10/31/2015, 10/23/2016, 10/19/2017, 10/23/2020  . Moderna Sars-Covid-2 Vaccination  01/31/2020, 02/28/2020, 10/16/2020  . Pneumococcal Conjugate-13 06/16/2018  . Pneumococcal Polysaccharide-23 02/04/2012  . Tdap 10/16/2018  . Zoster Recombinat (Shingrix) 07/10/2017, 11/13/2017, 06/27/2018   Pertinent  Health Maintenance Due  Topic Date Due  . INFLUENZA VACCINE  Completed  . DEXA SCAN  Completed  . PNA vac Low Risk Adult  Completed   Fall Risk  03/08/2021 10/24/2019 08/24/2019 04/27/2019 02/23/2019  Falls in the past year? 0 1 0 1 1  Number falls in past yr: 0 0 0 0 0  Injury with Fall? 0 1 0 1 1  Risk for fall due to : - - - - History of fall(s);Impaired balance/gait;Impaired mobility;Mental status change;Medication side effect  Follow up - - - - Falls evaluation completed;Education provided;Falls prevention discussed  Comment - - - - PT referral   Functional Status Survey:    Vitals:   03/25/21 0921  BP: 118/64  Pulse: 72  Resp: 18  Temp: 97.7 F (36.5 C)  TempSrc: Oral  SpO2: 97%  Weight: 126 lb 3.2 oz (57.2 kg)  Height: 5\' 1"  (1.549 m)   Body mass index is 23.85 kg/m. Physical Exam Vitals and nursing note reviewed.  Constitutional:      General: She is not in acute distress.    Appearance: She is not diaphoretic.  HENT:     Head: Normocephalic and atraumatic.     Nose: Nose normal. No congestion.     Mouth/Throat:     Mouth: Mucous membranes are moist.     Pharynx: Oropharynx is clear. No oropharyngeal exudate.  Eyes:     Conjunctiva/sclera: Conjunctivae normal.     Pupils: Pupils are equal, round, and reactive to light.  Neck:     Vascular: No JVD.  Cardiovascular:     Rate and Rhythm: Normal rate and regular rhythm.     Heart sounds: No murmur heard.   Pulmonary:     Effort: Pulmonary effort is normal. No respiratory distress.     Breath sounds: Normal breath sounds. No wheezing.  Abdominal:     General: Bowel sounds are normal. There is no distension.     Palpations: Abdomen is soft.     Tenderness: There is no abdominal tenderness.   Musculoskeletal:        General: No swelling, tenderness, deformity or signs of injury.     Cervical back: No rigidity or tenderness.     Right lower leg: No edema.     Left lower leg: No edema.  Lymphadenopathy:     Cervical: No cervical adenopathy.  Skin:    General: Skin is warm and dry.  Neurological:     General: No focal deficit present.     Mental Status: She is alert. Mental status is at baseline.  Psychiatric:        Mood and Affect: Mood normal.     Labs reviewed: Recent Labs    01/07/21 0000  NA 145  K 4.4  CL 108  CO2 26*  BUN 21  CREATININE 0.8  CALCIUM 9.6   No results for input(s): AST, ALT, ALKPHOS, BILITOT, PROT, ALBUMIN in the last 8760 hours. Recent Labs    01/07/21 0000  WBC 5.3  HGB 13.0  HCT 37  PLT 202   Lab Results  Component Value Date   TSH 3.70 02/24/2017   No results found  for: HGBA1C Lab Results  Component Value Date   CHOL 152 01/07/2021   HDL 48 01/07/2021   LDLCALC 85 01/07/2021   TRIG 96 01/07/2021    Significant Diagnostic Results in last 30 days:  No results found.  Assessment/Plan  1. Dementia of the Alzheimer's type, with late onset, with delusions ([place]) Moderate stage without functional limitations No behavioral concerns at this time (continues with delusions that are not bothersome so medication is not warranted) Continue Exelon 3 mg bid   2. Osteopenia of multiple sites Dexa scan pending in August Continue VIt D supplementation and MVI  3. Pure hypercholesterolemia Lab Results  Component Value Date   LDLCALC 85 01/07/2021   Given her dementia and age goal LDL of <100 is sufficient Continue Zocor 20 mg qd   4. Primary osteoarthritis involving multiple joints No current issues Remains ambulatory without limitations in ambulation  Continue Glucosamine    Family/ staff Communication: discussed with the nurse on memory care   Labs/tests ordered:  NA

## 2021-05-20 ENCOUNTER — Non-Acute Institutional Stay: Payer: Medicare Other | Admitting: Adult Health

## 2021-05-20 DIAGNOSIS — R4689 Other symptoms and signs involving appearance and behavior: Secondary | ICD-10-CM | POA: Diagnosis not present

## 2021-05-20 DIAGNOSIS — M159 Polyosteoarthritis, unspecified: Secondary | ICD-10-CM

## 2021-05-20 DIAGNOSIS — M8949 Other hypertrophic osteoarthropathy, multiple sites: Secondary | ICD-10-CM

## 2021-05-20 DIAGNOSIS — E7800 Pure hypercholesterolemia, unspecified: Secondary | ICD-10-CM

## 2021-05-20 DIAGNOSIS — G301 Alzheimer's disease with late onset: Secondary | ICD-10-CM

## 2021-05-20 DIAGNOSIS — F02818 Dementia in other diseases classified elsewhere, unspecified severity, with other behavioral disturbance: Secondary | ICD-10-CM

## 2021-05-20 DIAGNOSIS — M8589 Other specified disorders of bone density and structure, multiple sites: Secondary | ICD-10-CM

## 2021-05-20 DIAGNOSIS — F5101 Primary insomnia: Secondary | ICD-10-CM

## 2021-05-20 DIAGNOSIS — F22 Delusional disorders: Secondary | ICD-10-CM

## 2021-05-20 DIAGNOSIS — F0281 Dementia in other diseases classified elsewhere with behavioral disturbance: Secondary | ICD-10-CM

## 2021-05-21 ENCOUNTER — Encounter: Payer: Self-pay | Admitting: Adult Health

## 2021-05-21 NOTE — Progress Notes (Signed)
Location:  Occupational psychologist of Service:  ALF (13) Provider:   Cindi Carbon, [place] [phone]   Virgie Dad, MD  Patient Care Team: Virgie Dad, MD as PCP - General (Internal Medicine) Addison Lank, MD as Consulting Physician (Dermatology)  Extended Emergency Contact Information Primary Emergency Contact: Mackel,[name] Address: [address] [place], [place] [address] [name] of [place] Phone: [phone] Relation: Son  Code Status:  DNR Goals of care: Advanced Directive information Advanced Directives 03/08/2021  Does Patient Have a Medical Advance Directive? Yes  Type of Paramedic of [place];Living will  Does patient want to make changes to medical advance directive? No - Patient declined  Copy of [place] in Chart? Yes - validated most recent copy scanned in chart (See row information)  Would patient like information on creating a medical advance directive? -  Pre-existing out of facility DNR order (yellow form or pink MOST form) -     Chief Complaint  Patient presents with  . Medical Management of Chronic Issues    HPI:  Pt is a 87 y.o. female seen today for medical management of chronic diseases.    She continues to remain ambulatory in the memory care setting.  She can not answer questions accurately but remains pleasant. She has some OCD like behaviors such as counting, picking at bibs, focused on repetitive tasks, etc. No issues with aggression or anger.   BP 275-170 systolic  Weight remains stable Wt Readings from Last 3 Encounters:  05/21/21 125 lb (56.7 kg)  03/25/21 126 lb 3.2 oz (57.2 kg)  11/15/20 121 lb 6.4 oz (55.1 kg)   The nurse reports she is having difficulty taking vitamins. No coughing or choking. Mainly just wanting to avoid swallowing.    Past Medical History:  Diagnosis Date  . Alzheimer's disease ([place])   . High  cholesterol   . Uterine cancer [facility])    Past Surgical History:  Procedure Laterality Date  . [facility]  . cataract surgery Bilateral     Allergies  Allergen Reactions  . Penicillins Hives  . Sulfa Antibiotics Hives    Outpatient Encounter Medications as of 05/20/2021  Medication Sig  . Cholecalciferol (VITAMIN D3) 2000 units TABS Take 1 tablet by mouth daily.  . Melatonin 1 MG TABS Take by mouth at bedtime.  . memantine (NAMENDA XR) 28 MG CP24 24 hr capsule Take 1 capsule (28 mg total) by mouth daily.  . rivastigmine (EXELON) 3 MG capsule Take 1 capsule (3 mg total) by mouth 2 (two) times daily.  . simvastatin (ZOCOR) 20 MG tablet TAKE 1 TABLET DAILY  . [DISCONTINUED] Glucosamine-Chondroit-Vit C-Mn (GLUCOSAMINE CHONDR 1500 COMPLX PO) Take 1 tablet by mouth 2 (two) times daily.  . [DISCONTINUED] Multiple Vitamins-Minerals (MULTIVITAMIN WITH MINERALS) tablet Take 1 tablet by mouth daily.   No facility-administered encounter medications on file as of 05/20/2021.    Review of Systems  Unable to perform ROS: Dementia    Immunization History  Administered Date(s) Administered  . Influenza, High Dose Seasonal PF 10/07/2019  . Influenza,inj,Quad PF,6+ Mos 10/22/2018  . Influenza-Unspecified 10/31/2015, 10/23/2016, 10/19/2017, 10/23/2020  . Moderna Sars-Covid-2 Vaccination 01/31/2020, 02/28/2020, 10/16/2020  . Pneumococcal Conjugate-13 06/16/2018  . Pneumococcal Polysaccharide-23 02/04/2012  . Tdap 10/16/2018  . Zoster Recombinat (Shingrix) 07/10/2017, 11/13/2017, 06/27/2018   Pertinent  Health Maintenance  Due  Topic Date Due  . INFLUENZA VACCINE  07/29/2021  . DEXA SCAN  Completed  . PNA vac Low Risk Adult  Completed   Fall Risk  03/08/2021 10/24/2019 08/24/2019 04/27/2019 02/23/2019  Falls in the past year? 0 1 0 1 1  Number falls in past yr: 0 0 0 0 0  Injury with Fall? 0 1 0 1 1  Risk for fall due to : - - - - History of fall(s);Impaired  balance/gait;Impaired mobility;Mental status change;Medication side effect  Follow up - - - - Falls evaluation completed;Education provided;Falls prevention discussed  Comment - - - - PT referral   Functional Status Survey:    Vitals:   05/21/21 1955  Temp: 97.8 F (36.6 C)  Weight: 120 lb (54.4 kg)   Body mass index is 22.67 kg/m. Physical Exam Vitals and nursing note reviewed.  Constitutional:      General: She is not in acute distress.    Appearance: She is not diaphoretic.  HENT:     Head: Normocephalic and atraumatic.     Nose: Nose normal. No congestion.     Mouth/Throat:     Mouth: Mucous membranes are moist.     Pharynx: Oropharynx is clear. No oropharyngeal exudate.  Eyes:     Conjunctiva/sclera: Conjunctivae normal.     Pupils: Pupils are equal, round, and reactive to light.  Neck:     Vascular: No JVD.  Cardiovascular:     Rate and Rhythm: Normal rate and regular rhythm.     Heart sounds: No murmur heard.   Pulmonary:     Effort: Pulmonary effort is normal. No respiratory distress.     Breath sounds: Normal breath sounds. No wheezing.  Musculoskeletal:     Right lower leg: No edema.     Left lower leg: No edema.  Skin:    General: Skin is warm and dry.  Neurological:     General: No focal deficit present.     Mental Status: She is alert. Mental status is at baseline.     Labs reviewed: Recent Labs    01/07/21 0000  NA 145  K 4.4  CL 108  CO2 26*  BUN 21  CREATININE 0.8  CALCIUM 9.6   No results for input(s): AST, ALT, ALKPHOS, BILITOT, PROT, ALBUMIN in the last 8760 hours. Recent Labs    01/07/21 0000  WBC 5.3  HGB 13.0  HCT 37  PLT 202   Lab Results  Component Value Date   TSH 3.70 02/24/2017   No results found for: HGBA1C Lab Results  Component Value Date   CHOL 152 01/07/2021   HDL 48 01/07/2021   LDLCALC 85 01/07/2021   TRIG 96 01/07/2021    Significant Diagnostic Results in last 30 days:  No results  found.  Assessment/Plan  1. Dementia of the Alzheimer's type, with late onset, with delusions ([place]) Continues to benefit from the memory care setting Continue Namenda and Exelon  2. Primary osteoarthritis involving multiple joints No current pain related issues Will d/c glucosamine due to difficulty getting the pt to swallow it  3. Osteopenia of multiple sites Noted on last dexa, next one is due in August.   4. Wandering behavior Due to dementia, see #1  5. Primary insomnia No reported issues with sleep Continue melatonin  6. Pure hypercholesterolemia Lab Results  Component Value Date   LDLCALC 85 01/07/2021  Continue zocor 20 mg     Labs/tests ordered:  NA

## 2021-07-08 ENCOUNTER — Non-Acute Institutional Stay (SKILLED_NURSING_FACILITY): Payer: Medicare Other | Admitting: Internal Medicine

## 2021-07-08 ENCOUNTER — Encounter: Payer: Self-pay | Admitting: Internal Medicine

## 2021-07-08 DIAGNOSIS — F5101 Primary insomnia: Secondary | ICD-10-CM

## 2021-07-08 DIAGNOSIS — G301 Alzheimer's disease with late onset: Secondary | ICD-10-CM

## 2021-07-08 DIAGNOSIS — F0281 Dementia in other diseases classified elsewhere with behavioral disturbance: Secondary | ICD-10-CM

## 2021-07-08 DIAGNOSIS — F02818 Dementia in other diseases classified elsewhere, unspecified severity, with other behavioral disturbance: Secondary | ICD-10-CM

## 2021-07-08 DIAGNOSIS — E7800 Pure hypercholesterolemia, unspecified: Secondary | ICD-10-CM

## 2021-07-08 NOTE — Progress Notes (Addendum)
Location:   [place] Room Number: 779 Place of Service:  SNF [phone]) Provider:  Veleta Miners MD  Virgie Dad, MD  Patient Care Team: Virgie Dad, MD as PCP - General (Internal Medicine) Addison Lank, MD as Consulting Physician (Dermatology)  Extended Emergency Contact Information Primary Emergency Contact: Devries,[name] Address: [address] [place], [place] [address] [name] of [place] Phone: [phone] Relation: Son  Code Status:  DNR Goals of care: Advanced Directive information Advanced Directives 07/08/2021  Does Patient Have a Medical Advance Directive? Yes  Type of Advance Directive Out of facility DNR (pink MOST or yellow form);Living will  Does patient want to make changes to medical advance directive? No - Patient declined  Copy of [place] in Chart? -  Would patient like information on creating a medical advance directive? -  Pre-existing out of facility DNR order (yellow form or pink MOST form) Yellow form placed in chart (order not valid for inpatient use)     Chief Complaint  Patient presents with   Medical Management of Chronic Issues   Health Maintenance    #3 covid vaccine    HPI:  Pt is a 87 y.o. female seen today for medical management of chronic diseases.    Patient has h/o Alzheimer's Disease and HLD  Walks with Mild Assist Has mild Aphasia. Does follow simple Commands Lives in Memory unit No Falls. Weight is stable No Nursing issues Past Medical History:  Diagnosis Date   Alzheimer's disease ([place])    High cholesterol    Uterine cancer [facility])    Past Surgical History:  Procedure Laterality Date   [facility]   cataract surgery Bilateral     Allergies  Allergen Reactions   Penicillins Hives   Sulfa Antibiotics Hives    Allergies as of 07/08/2021       Reactions   Penicillins Hives   Sulfa Antibiotics Hives         Medication List        Accurate as of July 08, 2021  1:43 PM. If you have any questions, ask your nurse or doctor.          melatonin 1 MG Tabs tablet Take by mouth at bedtime.   memantine 28 MG Cp24 24 hr capsule Commonly known as: NAMENDA XR Take 1 capsule (28 mg total) by mouth daily.   rivastigmine 3 MG capsule Commonly known as: Exelon Take 1 capsule (3 mg total) by mouth 2 (two) times daily.   simvastatin 20 MG tablet Commonly known as: ZOCOR TAKE 1 TABLET DAILY   Vitamin D3 50 MCG (2000 UT) Tabs Take 1 tablet by mouth daily.        Review of Systems  Unable to perform ROS: Dementia   Immunization History  Administered Date(s) Administered   Influenza, High Dose Seasonal PF 10/07/2019   Influenza,inj,Quad PF,6+ Mos 10/22/2018   Influenza-Unspecified 10/31/2015, 10/23/2016, 10/19/2017, 10/23/2020   Moderna Sars-Covid-2 Vaccination 01/31/2020, 02/28/2020, 10/16/2020   Pneumococcal Conjugate-13 06/16/2018   Pneumococcal Polysaccharide-23 02/04/2012   Tdap 10/16/2018   Zoster Recombinat (Shingrix) 07/10/2017, 11/13/2017, 06/27/2018   Pertinent  Health Maintenance Due  Topic Date Due   INFLUENZA VACCINE  07/29/2021   DEXA SCAN  Completed   PNA vac Low Risk Adult  Completed   Fall Risk  03/08/2021 10/24/2019 08/24/2019 04/27/2019 02/23/2019  Falls in the  past year? 0 1 0 1 1  Number falls in past yr: 0 0 0 0 0  Injury with Fall? 0 1 0 1 1  Risk for fall due to : - - - - History of fall(s);Impaired balance/gait;Impaired mobility;Mental status change;Medication side effect  Follow up - - - - Falls evaluation completed;Education provided;Falls prevention discussed  Comment - - - - PT referral   Functional Status Survey:    Vitals:   07/08/21 1338  BP: 118/70  Pulse: 64  Resp: 20  Temp: 97.8 F (36.6 C)  SpO2: 100%  Weight: 129 lb 9.6 oz (58.8 kg)  Height: 5\' 1"  (1.549 m)   Body mass index is 24.49 kg/m. Physical Exam Vitals reviewed.   Constitutional:      Appearance: Normal appearance.  HENT:     Head: Normocephalic.     Nose: Nose normal.     Mouth/Throat:     Mouth: Mucous membranes are moist.     Pharynx: Oropharynx is clear.  Eyes:     Pupils: Pupils are equal, round, and reactive to light.  Cardiovascular:     Rate and Rhythm: Normal rate and regular rhythm.     Pulses: Normal pulses.  Pulmonary:     Effort: Pulmonary effort is normal.     Breath sounds: Normal breath sounds.  Abdominal:     General: Abdomen is flat. Bowel sounds are normal.     Palpations: Abdomen is soft.  Musculoskeletal:        General: No swelling.     Cervical back: Neck supple.  Skin:    General: Skin is warm.  Neurological:     General: No focal deficit present.     Mental Status: She is alert.  Psychiatric:        Mood and Affect: Mood normal.        Thought Content: Thought content normal.    Labs reviewed: Recent Labs    01/07/21 0000  NA 145  K 4.4  CL 108  CO2 26*  BUN 21  CREATININE 0.8  CALCIUM 9.6   No results for input(s): AST, ALT, ALKPHOS, BILITOT, PROT, ALBUMIN in the last 8760 hours. Recent Labs    01/07/21 0000  WBC 5.3  HGB 13.0  HCT 37  PLT 202   Lab Results  Component Value Date   TSH 3.70 02/24/2017   No results found for: HGBA1C Lab Results  Component Value Date   CHOL 152 01/07/2021   HDL 48 01/07/2021   LDLCALC 85 01/07/2021   TRIG 96 01/07/2021    Significant Diagnostic Results in last 30 days:  No results found.  Assessment/Plan Late onset Alzheimer's disease with behavioral disturbance (HCC) Stable on Namenda and Exelon Patch  Pure hypercholesterolemia On Statin LDL less then 100 Primary insomnia Continue Melatonin   Family/ staff Communication:   Labs/tests ordered:

## 2021-07-11 ENCOUNTER — Encounter: Payer: Self-pay | Admitting: Internal Medicine

## 2021-07-12 ENCOUNTER — Encounter: Payer: Self-pay | Admitting: Adult Health

## 2021-07-12 ENCOUNTER — Non-Acute Institutional Stay: Payer: Medicare Other | Admitting: Adult Health

## 2021-07-12 DIAGNOSIS — U071 COVID-19: Secondary | ICD-10-CM | POA: Diagnosis not present

## 2021-07-12 NOTE — Progress Notes (Signed)
Location:   [place] Room Number: 785 Place of Service:  ALF [phone]) Provider:  Royal Hawthorn, NP  Virgie Dad, MD  Patient Care Team: Virgie Dad, MD as PCP - General (Internal Medicine) Addison Lank, MD as Consulting Physician (Dermatology)  Extended Emergency Contact Information Primary Emergency Contact: Alperin,[name] Address: [address], [place] [address] [name] of [place] Phone: [phone] Relation: Son  Code Status:  DNR Goals of care: Advanced Directive information Advanced Directives 07/08/2021  Does Patient Have a Medical Advance Directive? Yes  Type of Advance Directive Out of facility DNR (pink MOST or yellow form);Living will  Does patient want to make changes to medical advance directive? No - Patient declined  Copy of [place] in Chart? -  Would patient like information on creating a medical advance directive? -  Pre-existing out of facility DNR order (yellow form or pink MOST form) Yellow form placed in chart (order not valid for inpatient use)     Chief Complaint  Patient presents with   Acute Visit    Covid    HPI:  Pt is a 87 y.o. female seen today for an acute visit for covid. Karen Mclean has advanced dementia and resides in the memory care unit. She had a rapid covid test done due to an outbreak on her unit which returned positive. She has not had any cough, congestion, or other symptoms. She is ambulatory and eating and drinking well per staff. Sats are WNL with no fever or sob. She is vaccinated x 3 and has had covid. PCR test is pending at this time.    Past Medical History:  Diagnosis Date   Alzheimer's disease ([place])    High cholesterol    Uterine cancer [facility])    Past Surgical History:  Procedure Laterality Date   [facility]   cataract surgery Bilateral     Allergies  Allergen Reactions   Penicillins Hives    Sulfa Antibiotics Hives    Allergies as of 07/12/2021       Reactions   Penicillins Hives   Sulfa Antibiotics Hives        Medication List        Accurate as of July 12, 2021 10:21 AM. If you have any questions, ask your nurse or doctor.          melatonin 1 MG Tabs tablet Take by mouth at bedtime.   memantine 28 MG Cp24 24 hr capsule Commonly known as: NAMENDA XR Take 1 capsule (28 mg total) by mouth daily.   rivastigmine 3 MG capsule Commonly known as: Exelon Take 1 capsule (3 mg total) by mouth 2 (two) times daily.   simvastatin 20 MG tablet Commonly known as: ZOCOR TAKE 1 TABLET DAILY   Vitamin D3 50 MCG (2000 UT) Tabs Take 1 tablet by mouth daily.        Review of Systems  Unable to perform ROS: Dementia   Immunization History  Administered Date(s) Administered   Influenza, High Dose Seasonal PF 10/07/2019   Influenza,inj,Quad PF,6+ Mos 10/22/2018   Influenza-Unspecified 10/31/2015, 10/23/2016, 10/19/2017, 10/23/2020   Moderna Sars-Covid-2 Vaccination 01/31/2020, 02/28/2020, 10/16/2020   Pneumococcal Conjugate-13 06/16/2018   Pneumococcal Polysaccharide-23 02/04/2012   Tdap 10/16/2018   Zoster Recombinat (Shingrix) 07/10/2017, 11/13/2017, 06/27/2018   Pertinent  Health Maintenance Due  Topic Date Due   INFLUENZA  VACCINE  07/29/2021   DEXA SCAN  Completed   PNA vac Low Risk Adult  Completed   Fall Risk  03/08/2021 10/24/2019 08/24/2019 04/27/2019 02/23/2019  Falls in the past year? 0 1 0 1 1  Number falls in past yr: 0 0 0 0 0  Injury with Fall? 0 1 0 1 1  Risk for fall due to : - - - - History of fall(s);Impaired balance/gait;Impaired mobility;Mental status change;Medication side effect  Follow up - - - - Falls evaluation completed;Education provided;Falls prevention discussed  Comment - - - - PT referral   Functional Status Survey:    Vitals:   07/12/21 1019  BP: 135/69  Pulse: 67  Resp: 20  Temp: (!) 97.2 F (36.2 C)  SpO2: 98%   Weight: 129 lb 9.6 oz (58.8 kg)  Height: 5\' 1"  (1.549 m)   Body mass index is 24.49 kg/m. Physical Exam Vitals and nursing note reviewed.  Constitutional:      General: She is not in acute distress.    Appearance: She is not diaphoretic.  HENT:     Head: Normocephalic and atraumatic.     Mouth/Throat:     Mouth: Mucous membranes are moist.     Pharynx: Oropharynx is clear. No oropharyngeal exudate.  Neck:     Vascular: No JVD.  Cardiovascular:     Rate and Rhythm: Normal rate and regular rhythm.     Heart sounds: No murmur heard. Pulmonary:     Effort: Pulmonary effort is normal. No respiratory distress.     Breath sounds: Normal breath sounds. No wheezing.  Abdominal:     General: Bowel sounds are normal. There is no distension.     Palpations: Abdomen is soft.     Tenderness: There is no abdominal tenderness.  Musculoskeletal:        General: No swelling, tenderness, deformity or signs of injury.     Cervical back: No rigidity or tenderness.     Right lower leg: No edema.     Left lower leg: No edema.  Lymphadenopathy:     Cervical: No cervical adenopathy.  Skin:    General: Skin is warm and dry.  Neurological:     Mental Status: She is alert. Mental status is at baseline.  Psychiatric:        Mood and Affect: Mood normal.    Labs reviewed: Recent Labs    01/07/21 0000  NA 145  K 4.4  CL 108  CO2 26*  BUN 21  CREATININE 0.8  CALCIUM 9.6   No results for input(s): AST, ALT, ALKPHOS, BILITOT, PROT, ALBUMIN in the last 8760 hours. Recent Labs    01/07/21 0000  WBC 5.3  HGB 13.0  HCT 37  PLT 202   Lab Results  Component Value Date   TSH 3.70 02/24/2017   No results found for: HGBA1C Lab Results  Component Value Date   CHOL 152 01/07/2021   HDL 48 01/07/2021   LDLCALC 85 01/07/2021   TRIG 96 01/07/2021    Significant Diagnostic Results in last 30 days:  No results found.  Assessment/Plan  1. COVID-19 virus RNA test result positive at  limit of detection No current symptoms are reported at this time and a PCR test is pending. She will remain on isolation as they are able to at [facility] which is difficult given her dementia. We will continue to monitor for symptoms and if present the staff has been instructed to let us know  so that we can initiate possible paxlovid prescription. The issue is that she has some pills dysphagia and so we are not sure she will be able to take the full course. She is also on a statin.    Labs/tests ordered:   PCR pending

## 2021-08-19 ENCOUNTER — Ambulatory Visit
Admission: RE | Admit: 2021-08-19 | Discharge: 2021-08-19 | Disposition: A | Discharge status: Home / Self Care | Payer: Medicare Other | Source: Ambulatory Visit | Attending: Nurse Practitioner | Admitting: Nurse Practitioner

## 2021-08-19 ENCOUNTER — Other Ambulatory Visit: Payer: Self-pay

## 2021-08-19 DIAGNOSIS — E2839 Other primary ovarian failure: Secondary | ICD-10-CM

## 2021-08-22 ENCOUNTER — Other Ambulatory Visit: Payer: Self-pay

## 2021-08-22 ENCOUNTER — Ambulatory Visit
Admission: RE | Admit: 2021-08-22 | Discharge: 2021-08-22 | Disposition: A | Discharge status: Home / Self Care | Payer: Medicare Other | Source: Ambulatory Visit | Attending: Nurse Practitioner | Admitting: Nurse Practitioner

## 2021-08-22 DIAGNOSIS — M810 Age-related osteoporosis without current pathological fracture: Secondary | ICD-10-CM | POA: Diagnosis not present

## 2021-08-22 DIAGNOSIS — Z780 Asymptomatic menopausal state: Secondary | ICD-10-CM | POA: Diagnosis not present

## 2021-09-13 ENCOUNTER — Encounter: Payer: Self-pay | Admitting: Adult Health

## 2021-09-13 ENCOUNTER — Non-Acute Institutional Stay: Payer: Medicare Other | Admitting: Adult Health

## 2021-09-13 DIAGNOSIS — E7800 Pure hypercholesterolemia, unspecified: Secondary | ICD-10-CM

## 2021-09-13 DIAGNOSIS — F5101 Primary insomnia: Secondary | ICD-10-CM

## 2021-09-13 DIAGNOSIS — G301 Alzheimer's disease with late onset: Secondary | ICD-10-CM

## 2021-09-13 DIAGNOSIS — F22 Delusional disorders: Secondary | ICD-10-CM

## 2021-09-13 DIAGNOSIS — R4689 Other symptoms and signs involving appearance and behavior: Secondary | ICD-10-CM | POA: Diagnosis not present

## 2021-09-13 DIAGNOSIS — M810 Age-related osteoporosis without current pathological fracture: Secondary | ICD-10-CM | POA: Diagnosis not present

## 2021-09-13 DIAGNOSIS — F0281 Dementia in other diseases classified elsewhere with behavioral disturbance: Secondary | ICD-10-CM

## 2021-09-13 DIAGNOSIS — F02818 Dementia in other diseases classified elsewhere, unspecified severity, with other behavioral disturbance: Secondary | ICD-10-CM

## 2021-09-13 NOTE — Progress Notes (Signed)
Location:    [place] Room Number: [id number] Place of Service:  SNF [phone]) Provider:  Royal Hawthorn, NP  Virgie Dad, MD  Patient Care Team: Virgie Dad, MD as PCP - General (Internal Medicine) Addison Lank, MD as Consulting Physician (Dermatology)  Extended Emergency Contact Information Primary Emergency Contact: Kneeland,[name] Address: [address], [place] [address] [name] of [place] Phone: [phone] Relation: Son  Code Status:  DNR Goals of care: Advanced Directive information Advanced Directives 09/13/2021  Does Patient Have a Medical Advance Directive? Yes  Type of Paramedic of [place];Living will  Does patient want to make changes to medical advance directive? No - Patient declined  Copy of [place] in Chart? Yes - validated most recent copy scanned in chart (See row information)  Would patient like information on creating a medical advance directive? -  Pre-existing out of facility DNR order (yellow form or pink MOST form) -     Chief Complaint  Patient presents with   Medical Management of Chronic Issues   Quality Metric Gaps    #4 covid, flu shot     HPI:  Pt is a 87 y.o. female seen today for medical management of chronic diseases.   PMH significant for Alzheimer's disease, hyperlipidemia, uterine cancer status post hysterectomy, mild aphasia, and wandering behavior.  She continues to be in the memory care unit ambulatory wonders about the area.  She counts "1, 2, 3, 4" while being examined.  Remains pleasant and able to follow simple commands but cannot answer questions.  There are no acute concerns regarding her care.  Recently she had a bone density test showing osteoporosis but we elected not to treat it but she has difficulty with taking medication and has advanced dementia  Has not had 4th covid shot but recently recovered from Covid.  Flu [facility] will [facility] at [facility].  Past Medical History:  Diagnosis Date   Alzheimer's disease ([place])    High cholesterol    Uterine cancer [facility])    Past Surgical History:  Procedure Laterality Date   [facility]   cataract surgery Bilateral     Allergies  Allergen Reactions   Penicillins Hives   Sulfa Antibiotics Hives    Allergies as of 09/13/2021       Reactions   Penicillins Hives   Sulfa Antibiotics Hives        Medication List        Accurate as of September 13, 2021 10:37 AM. If you have any questions, ask your nurse or doctor.          melatonin 1 MG Tabs tablet Take by mouth at bedtime.   memantine 28 MG Cp24 24 hr capsule Commonly known as: NAMENDA XR Take 1 capsule (28 mg total) by mouth daily.   rivastigmine 3 MG capsule Commonly known as: Exelon Take 1 capsule (3 mg total) by mouth 2 (two) times daily.   simvastatin 20 MG tablet Commonly known as: ZOCOR TAKE 1 TABLET DAILY   Vitamin D3 50 MCG (2000 UT) Tabs Take 1 tablet by mouth daily.        Review of Systems  Unable to perform ROS: Dementia   Immunization History  Administered Date(s) Administered   Influenza, High Dose Seasonal PF 10/07/2019   Influenza,inj,Quad PF,6+ Mos 10/22/2018   Influenza-Unspecified  10/31/2015, 10/23/2016, 10/19/2017, 10/23/2020   Moderna Sars-Covid-2 Vaccination 01/31/2020, 02/28/2020, 10/16/2020   Pneumococcal Conjugate-13 06/16/2018   Pneumococcal Polysaccharide-23 02/04/2012   Tdap 10/16/2018   Zoster Recombinat (Shingrix) 07/10/2017, 11/13/2017, 06/27/2018   Pertinent  Health Maintenance Due  Topic Date Due   INFLUENZA VACCINE  07/29/2021   DEXA SCAN  Completed   PNA vac Low Risk Adult  Completed   Fall Risk  03/08/2021 10/24/2019 08/24/2019 04/27/2019 02/23/2019  Falls in the past year? 0 1 0 1 1  Number falls in past yr: 0 0 0 0 0  Injury with Fall? 0 1 0 1 1  Risk for fall due to : - - - -  History of fall(s);Impaired balance/gait;Impaired mobility;Mental status change;Medication side effect  Follow up - - - - Falls evaluation completed;Education provided;Falls prevention discussed  Comment - - - - PT referral   Functional Status Survey:    Vitals:   09/13/21 1006  BP: 116/67  Pulse: 72  Temp: (!) 97.3 F (36.3 C)  SpO2: 100%  Weight: 127 lb 6.4 oz (57.8 kg)  Height: '5\' 1"'$  (1.549 m)   Body mass index is 24.07 kg/m. Wt Readings from Last 3 Encounters:  09/13/21 127 lb 6.4 oz (57.8 kg)  07/12/21 129 lb 9.6 oz (58.8 kg)  07/08/21 129 lb 9.6 oz (58.8 kg)    Physical Exam Vitals and nursing note reviewed.  Constitutional:      General: She is not in acute distress.    Appearance: She is not diaphoretic.  HENT:     Head: Normocephalic and atraumatic.     Mouth/Throat:     Mouth: Mucous membranes are moist.     Pharynx: Oropharynx is clear.  Eyes:     Conjunctiva/sclera: Conjunctivae normal.     Pupils: Pupils are equal, round, and reactive to light.  Neck:     Vascular: No JVD.  Cardiovascular:     Rate and Rhythm: Normal rate and regular rhythm.     Heart sounds: No murmur heard. Pulmonary:     Effort: Pulmonary effort is normal. No respiratory distress.     Breath sounds: Normal breath sounds. No wheezing.  Abdominal:     General: Bowel sounds are normal. There is no distension.     Palpations: Abdomen is soft.     Tenderness: There is no abdominal tenderness.  Musculoskeletal:     Right lower leg: No edema.     Left lower leg: No edema.  Skin:    General: Skin is warm and dry.  Neurological:     General: No focal deficit present.     Mental Status: She is alert. Mental status is at baseline.  Psychiatric:        Mood and Affect: Mood normal.    Labs reviewed: Recent Labs    01/07/21 0000  NA 145  K 4.4  CL 108  CO2 26*  BUN 21  CREATININE 0.8  CALCIUM 9.6   No results for input(s): AST, ALT, ALKPHOS, BILITOT, PROT, ALBUMIN in the  last 8760 hours. Recent Labs    01/07/21 0000  WBC 5.3  HGB 13.0  HCT 37  PLT 202   Lab Results  Component Value Date   TSH 3.70 02/24/2017   No results found for: HGBA1C Lab Results  Component Value Date   CHOL 152 01/07/2021   HDL 48 01/07/2021   LDLCALC 85 01/07/2021   TRIG 96 01/07/2021    Significant Diagnostic Results in last 30  days:  DG Bone Density  Result Date: 08/22/2021 EXAM: DUAL X-RAY ABSORPTIOMETRY (DXA) FOR BONE MINERAL DENSITY IMPRESSION: Referring Physician:  Lauree Chandler Your patient completed a bone mineral density test using GE Lunar iDXA system (analysis version: 16). Technologist: [place] PATIENT: Name: Karen, Mclean Patient ID: [id number] Birth Date: 12/21/1933 Height: 59.0 in. Sex: Female Measured: 08/22/2021 Weight: 125.8 lbs. Indications: Advanced Age, Caucasian, Estrogen Deficient, Hysterectomy, Low Calcium Intake (269.3), Namenda, Postmenopausal, Uterine Cancer Fractures: None Treatments: Vitamin D (E933.5) ASSESSMENT: The BMD measured at AP Spine L1-L4 is 0.848 g/cm2 with a T-score of -2.8. This patient is considered osteoporotic according to [facility]) criteria. The quality of the exam is good. Site Region Measured Date Measured Age YA BMD Significant CHANGE T-score AP Spine  L1-L4      10/22/2023    85.7         -2.8    0.848 g/cm2 DualFemur Neck Right 08/22/2021 85.7 -2.7 0.661 g/cm2 * DualFemur Neck Right 01/22/2017    81.1         -1.9    0.767 g/cm2 DualFemur Total Mean 08/22/2021 85.7 -1.5 0.816 g/cm2 * DualFemur Total Mean 01/22/2017    81.1         -1.1    0.872 g/cm2 World Health Organization [facility]) criteria for post-menopausal, Caucasian Women: Normal       T-score at or above -1 SD Osteopenia   T-score between -1 and -2.5 SD Osteoporosis T-score at or below -2.5 SD RECOMMENDATION: 1. All patients should optimize calcium and vitamin D intake. 2. Consider FDA-approved medical therapies in postmenopausal women and men aged 40 years  and older, based on the following: a. A hip or vertebral (clinical or morphometric) fracture. b. T-score = -2.5 at the femoral neck or spine after appropriate evaluation to exclude secondary causes. c. Low bone mass (T-score between -1.0 and -2.5 at the femoral neck or spine) and a 10-year probability of a hip fracture = 3% or a 10-year probability of a major osteoporosis-related fracture = 20% based on the [place]-adapted WHO algorithm. d. Clinician judgment and/or patient preferences may indicate treatment for people with 10-year fracture probabilities above or below these levels. FOLLOW-UP: Patients with diagnosis of osteoporosis or at high risk for fracture should have regular bone mineral density tests.? Patients eligible for Medicare are allowed routine testing every 2 years.? The testing frequency can be increased to one year for patients who have rapidly progressing disease, are receiving or discontinuing medical therapy to restore bone mass, or have additional risk factors. I have reviewed this study and agree with the findings. Mark A. Thornton Papas, M.D. [facility] Electronically Signed   By: Lavonia Dana M.D.   On: 08/22/2021 14:27    Assessment/Plan 1. Dementia of the Alzheimer's type, with late onset, with delusions ([place]) Progressive decline in cognition and physical function c/w the disease. Continue supportive care in the skilled environment. Continue Namenda.   2. Pure hypercholesterolemia Lab Results  Component Value Date   LDLCALC 85 01/07/2021   Continue zocor   3. Wandering behavior Continues to benefit from the memory care setting.   4. Primary insomnia No issues on melatonin   5. Age-related osteoporosis without current pathological fracture BMD 08/22/21 T score -2.8 Discussed with Dr Lyndel Safe and due to her advancing dementia and difficulty swallowing pills along with her skilled care status we have elected not to treat this issue. She can stay on Vit D. Continue to  ambulate. No  able to chew or swallow calcium so it was previously discontinued.     Family/ staff Communication:  nurse, also discussed with her husband in his visit.   Labs/tests ordered:  na

## 2021-10-17 ENCOUNTER — Encounter: Payer: Self-pay | Admitting: Adult Health

## 2021-10-17 ENCOUNTER — Non-Acute Institutional Stay: Payer: Medicare Other | Admitting: Adult Health

## 2021-10-17 DIAGNOSIS — R059 Cough, unspecified: Secondary | ICD-10-CM | POA: Diagnosis not present

## 2021-10-17 DIAGNOSIS — J181 Lobar pneumonia, unspecified organism: Secondary | ICD-10-CM | POA: Diagnosis not present

## 2021-10-17 DIAGNOSIS — R051 Acute cough: Secondary | ICD-10-CM

## 2021-10-17 DIAGNOSIS — H1031 Unspecified acute conjunctivitis, right eye: Secondary | ICD-10-CM

## 2021-10-17 DIAGNOSIS — R062 Wheezing: Secondary | ICD-10-CM | POA: Diagnosis not present

## 2021-10-17 LAB — CBC AND DIFFERENTIAL
HCT: 38 (ref 36–46)
Hemoglobin: 12.5 (ref 12.0–16.0)
Platelets: 265 (ref 150–399)
WBC: 10.3

## 2021-10-17 LAB — CBC: RBC: 4 (ref 3.87–5.11)

## 2021-10-17 MED ORDER — LEVOFLOXACIN 500 MG PO TABS: 500.0000 mg | ORAL_TABLET | Freq: Every day | ORAL | 0 refills | Status: AC

## 2021-10-17 NOTE — Progress Notes (Signed)
Location:  Occupational psychologist of Service:  ALF (13) Provider:   Cindi Carbon, [place] [phone]   Virgie Dad, MD  Patient Care Team: Virgie Dad, MD as PCP - General (Internal Medicine) Addison Lank, MD as Consulting Physician (Dermatology)  Extended Emergency Contact Information Primary Emergency Contact: Slaven,[name] Address: [address] [place], [place] [address] [name] of [place] Phone: [phone] Relation: Son  Code Status:  DNR Goals of care: Advanced Directive information Advanced Directives 09/13/2021  Does Patient Have a Medical Advance Directive? Yes  Type of Paramedic of [place];Living will  Does patient want to make changes to medical advance directive? No - Patient declined  Copy of [place] in Chart? Yes - validated most recent copy scanned in chart (See row information)  Would patient like information on creating a medical advance directive? -  Pre-existing out of facility DNR order (yellow form or pink MOST form) -     Chief Complaint  Patient presents with   Acute Visit    cough    HPI:  Pt is a 87 y.o. female seen today for an acute visit for cough and red eye.  Pt has AD with advanced symptoms and lives in the memory care setting. She is not able to provide a hx. Nurse reports she has had nasal congestion and cough for at least 5-6 days. She had a temp of 100.4 on 10/18, came down with tylenol. Now 98.2.  Her Cough is dry. They have noticed some mild wheezing. No sob.  She has some weakness and decreased intake. Sats have been in the 90s.  Has decreased intake. BP and HR ok. Rapid covid neg 10/17. Repeat 10/20 negative. Also right eye redness swelling and drainage noted for 1 day.   2 view CXR shows right middle and lower lobe pna.    Past Medical History:  Diagnosis Date   Alzheimer's disease ([place])    High cholesterol     Uterine cancer [facility])    Past Surgical History:  Procedure Laterality Date   [facility]   cataract surgery Bilateral     Allergies  Allergen Reactions   Penicillins Hives   Sulfa Antibiotics Hives    Outpatient Encounter Medications as of 10/17/2021  Medication Sig   Cholecalciferol (VITAMIN D3) 2000 units TABS Take 1 tablet by mouth daily.   Melatonin 1 MG TABS Take by mouth at bedtime.   memantine (NAMENDA XR) 28 MG CP24 24 hr capsule Take 1 capsule (28 mg total) by mouth daily.   rivastigmine (EXELON) 3 MG capsule Take 1 capsule (3 mg total) by mouth 2 (two) times daily.   simvastatin (ZOCOR) 20 MG tablet TAKE 1 TABLET DAILY   No facility-administered encounter medications on file as of 10/17/2021.    Review of Systems  Unable to perform ROS: Dementia   Immunization History  Administered Date(s) Administered   Influenza, High Dose Seasonal PF 10/07/2019   Influenza,inj,Quad PF,6+ Mos 10/22/2018   Influenza-Unspecified 10/31/2015, 10/23/2016, 10/19/2017, 10/23/2020   Moderna Sars-Covid-2 Vaccination 01/31/2020, 02/28/2020, 10/16/2020   Pneumococcal Conjugate-13 06/16/2018   Pneumococcal Polysaccharide-23 02/04/2012   Tdap 10/16/2018   Zoster Recombinat (Shingrix) 07/10/2017, 11/13/2017, 06/27/2018   Pertinent  Health Maintenance Due  Topic Date Due   INFLUENZA VACCINE  07/29/2021   DEXA SCAN  Completed   Fall  Risk  03/08/2021 10/24/2019 08/24/2019 04/27/2019 02/23/2019  Falls in the past year? 0 1 0 1 1  Number falls in past yr: 0 0 0 0 0  Injury with Fall? 0 1 0 1 1  Risk for fall due to : - - - - History of fall(s);Impaired balance/gait;Impaired mobility;Mental status change;Medication side effect  Follow up - - - - Falls evaluation completed;Education provided;Falls prevention discussed  Comment - - - - PT referral   Functional Status Survey:    Vitals:   10/17/21 1025  BP: (!) 148/60  Pulse: 80  Resp: 16  Temp: 98.2 F  (36.8 C)  SpO2: 93%   There is no height or weight on file to calculate BMI. Physical Exam Vitals and nursing note reviewed.  Constitutional:      General: She is not in acute distress.    Appearance: She is ill-appearing. She is not diaphoretic.     Comments: sleepy  HENT:     Head: Normocephalic and atraumatic.     Nose: Congestion and rhinorrhea present.  Eyes:     General:        Right eye: Discharge present.        Left eye: No discharge.     Conjunctiva/sclera:     Right eye: Right conjunctiva is injected. Exudate present. No chemosis or hemorrhage.    Left eye: Left conjunctiva is not injected. No chemosis, exudate or hemorrhage.    Pupils: Pupils are equal, round, and reactive to light.  Neck:     Vascular: No JVD.  Cardiovascular:     Rate and Rhythm: Normal rate and regular rhythm.     Heart sounds: No murmur heard. Pulmonary:     Effort: Pulmonary effort is normal. No respiratory distress.     Breath sounds: No wheezing.     Comments: Decreased bases bilat Musculoskeletal:     Right lower leg: No edema.     Left lower leg: No edema.  Skin:    General: Skin is warm and dry.  Neurological:     Mental Status: She is oriented to person, place, and time.    Labs reviewed: Recent Labs    01/07/21 0000  NA 145  K 4.4  CL 108  CO2 26*  BUN 21  CREATININE 0.8  CALCIUM 9.6   No results for input(s): AST, ALT, ALKPHOS, BILITOT, PROT, ALBUMIN in the last 8760 hours. Recent Labs    01/07/21 0000  WBC 5.3  HGB 13.0  HCT 37  PLT 202   Lab Results  Component Value Date   TSH 3.70 02/24/2017   No results found for: HGBA1C Lab Results  Component Value Date   CHOL 152 01/07/2021   HDL 48 01/07/2021   LDLCALC 85 01/07/2021   TRIG 96 01/07/2021    Significant Diagnostic Results in last 30 days:  No results found.  Assessment/Plan  1. Lobar pneumonia (HCC) RLL - levofloxacin (LEVAQUIN) 500 MG tablet; Take 1 tablet (500 mg total) by mouth daily for  7 days.  Dispense: 7 tablet; Refill: 0  2.  Acute cough Repeat Covid swab neg Duoneb tid prn cough/wheeze Keep in room until symptoms improve.  Encourage oral fluid intake  2. Acute conjunctivitis of right eye, unspecified acute conjunctivitis type Warm compresses tid to right eye 15 min until resolved.      Family/ staff Communication: discussed with her husband Iona Beard  Labs/tests ordered:  CBC Flu swab, covid, swab, CXR

## 2021-10-18 ENCOUNTER — Encounter: Payer: Self-pay | Admitting: Internal Medicine

## 2021-10-18 DIAGNOSIS — R051 Acute cough: Secondary | ICD-10-CM | POA: Diagnosis not present

## 2021-10-18 DIAGNOSIS — R062 Wheezing: Secondary | ICD-10-CM | POA: Diagnosis not present

## 2021-10-21 ENCOUNTER — Encounter: Payer: Self-pay | Admitting: Adult Health

## 2021-10-21 ENCOUNTER — Non-Acute Institutional Stay: Payer: Medicare Other | Admitting: Adult Health

## 2021-10-21 DIAGNOSIS — R1312 Dysphagia, oropharyngeal phase: Secondary | ICD-10-CM | POA: Diagnosis not present

## 2021-10-21 DIAGNOSIS — H1033 Unspecified acute conjunctivitis, bilateral: Secondary | ICD-10-CM | POA: Diagnosis not present

## 2021-10-21 DIAGNOSIS — R531 Weakness: Secondary | ICD-10-CM

## 2021-10-21 DIAGNOSIS — J181 Lobar pneumonia, unspecified organism: Secondary | ICD-10-CM | POA: Diagnosis not present

## 2021-10-21 MED ORDER — GENTAMICIN SULFATE 0.1 % EX OINT: 1.0000 "application " | TOPICAL_OINTMENT | Freq: Two times a day (BID) | CUTANEOUS | 0 refills | Status: AC

## 2021-10-21 NOTE — Progress Notes (Signed)
Location:   [facility] Room Number: 867 Place of Service:  SNF [phone]) Provider:  Royal Hawthorn, NP  Virgie Dad, MD  Patient Care Team: Virgie Dad, MD as PCP - General (Internal Medicine) Addison Lank, MD as Consulting Physician (Dermatology)  Extended Emergency Contact Information Primary Emergency Contact: Pry,[name] Address: [address] [place], [place] [address] [name] of [place] Phone: [phone] Relation: Son  Code Status:  DNR Goals of care: Advanced Directive information Advanced Directives 10/21/2021  Does Patient Have a Medical Advance Directive? Yes  Type of Paramedic of [place];Living will  Does patient want to make changes to medical advance directive? No - Patient declined  Copy of [place] in Chart? Yes - validated most recent copy scanned in chart (See row information)  Would patient like information on creating a medical advance directive? -  Pre-existing out of facility DNR order (yellow form or pink MOST form) -     Chief Complaint  Patient presents with   Acute Visit    BIL red and itchy eyes    HPI:  Pt is a 87 y.o. female seen today for an acute visit for red itchy eyes with drainage. She was seen on 10/22 due to cough, right eye swelling, and weakness. She was diagnosed right middle and lower lobe pneumonia and placed on Levaquin.  The right eye swelling improved with warm compresses and the antibiotic and but now she has red itchy symptoms to both eyes. Some purulent drainage as well  She has not required oxygen and is not sob. Has mild cough. Remains ambulatory but needs more assistance. Eating less. Slightly restless.     Past Medical History:  Diagnosis Date   Alzheimer's disease ([place])    High cholesterol    Uterine cancer [facility])    Past Surgical History:  Procedure Laterality Date   [facility]   cataract surgery Bilateral     Allergies  Allergen Reactions   Penicillins Hives   Sulfa Antibiotics Hives    Allergies as of 10/21/2021       Reactions   Penicillins Hives   Sulfa Antibiotics Hives        Medication List        Accurate as of October 21, 2021  4:09 PM. If you have any questions, ask your nurse or doctor.          ipratropium-albuterol 0.5-2.5 (3) MG/3ML Soln Commonly known as: DUONEB Take 3 mLs by nebulization 3 (three) times daily as needed.   levofloxacin 500 MG tablet Commonly known as: LEVAQUIN Take 1 tablet (500 mg total) by mouth daily for 7 days.   melatonin 1 MG Tabs tablet Take by mouth at bedtime.   memantine 28 MG Cp24 24 hr capsule Commonly known as: NAMENDA XR Take 1 capsule (28 mg total) by mouth daily.   rivastigmine 3 MG capsule Commonly known as: Exelon Take 1 capsule (3 mg total) by mouth 2 (two) times daily.   saccharomyces boulardii 250 MG capsule Commonly known as: FLORASTOR Take 250 mg by mouth 2 (two) times daily.   simvastatin 20 MG tablet Commonly known as: ZOCOR TAKE 1 TABLET DAILY   Vitamin D3 50 MCG (2000 UT) Tabs Take 1 tablet by mouth daily.        Review of Systems  Unable to perform ROS:  Dementia   Immunization History  Administered Date(s) Administered   Influenza, High Dose Seasonal PF 10/07/2019   Influenza,inj,Quad PF,6+ Mos 10/22/2018   Influenza-Unspecified 10/31/2015, 10/23/2016, 10/19/2017, 10/23/2020   Moderna Sars-Covid-2 Vaccination 01/31/2020, 02/28/2020, 10/16/2020   Pneumococcal Conjugate-13 06/16/2018   Pneumococcal Polysaccharide-23 02/04/2012   Tdap 10/16/2018   Zoster Recombinat (Shingrix) 07/10/2017, 11/13/2017, 06/27/2018   Pertinent  Health Maintenance Due  Topic Date Due   INFLUENZA VACCINE  07/29/2021   DEXA SCAN  Completed   Fall Risk  03/08/2021 10/24/2019 08/24/2019 04/27/2019 02/23/2019  Falls in the past year? 0 1 0 1 1  Number falls in past yr:  0 0 0 0 0  Injury with Fall? 0 1 0 1 1  Risk for fall due to : - - - - History of fall(s);Impaired balance/gait;Impaired mobility;Mental status change;Medication side effect  Follow up - - - - Falls evaluation completed;Education provided;Falls prevention discussed  Comment - - - - PT referral   Functional Status Survey:    Vitals:   10/21/21 1530  BP: (!) 154/80  Pulse: 78  Resp: 18  Temp: 97.7 F (36.5 C)  SpO2: 100%  Weight: 132 lb 8 oz (60.1 kg)  Height: 5\' 1"  (1.549 m)   Body mass index is 25.04 kg/m. Physical Exam Vitals and nursing note reviewed.  Constitutional:      General: She is not in acute distress.    Appearance: She is not diaphoretic.  HENT:     Head: Normocephalic and atraumatic.     Nose: Rhinorrhea present.     Mouth/Throat:     Mouth: Mucous membranes are moist.     Pharynx: Oropharynx is clear.  Eyes:     General:        Right eye: Discharge present.        Left eye: Discharge present.    Pupils: Pupils are equal, round, and reactive to light.     Comments: Erythema to both sclera and conjunctiva.   Neck:     Vascular: No JVD.  Cardiovascular:     Rate and Rhythm: Normal rate and regular rhythm.     Heart sounds: No murmur heard. Pulmonary:     Effort: Pulmonary effort is normal. No respiratory distress.     Breath sounds: No wheezing.     Comments: Decreased right base  Abdominal:     General: Bowel sounds are normal. There is no distension.     Palpations: Abdomen is soft.  Musculoskeletal:     Right lower leg: No edema.     Left lower leg: No edema.  Skin:    General: Skin is warm and dry.  Neurological:     General: No focal deficit present.     Mental Status: She is alert. Mental status is at baseline.  Psychiatric:        Mood and Affect: Mood normal.    Labs reviewed: Recent Labs    01/07/21 0000  NA 145  K 4.4  CL 108  CO2 26*  BUN 21  CREATININE 0.8  CALCIUM 9.6   No results for input(s): AST, ALT, ALKPHOS,  BILITOT, PROT, ALBUMIN in the last 8760 hours. Recent Labs    01/07/21 0000 10/17/21 0000  WBC 5.3 10.3  HGB 13.0 12.5  HCT 37 38  PLT 202 265   Lab Results  Component Value Date   TSH 3.70 02/24/2017   No results found for: HGBA1C Lab Results  Component Value Date   CHOL  152 01/07/2021   HDL 48 01/07/2021   LDLCALC 85 01/07/2021   TRIG 96 01/07/2021    Significant Diagnostic Results in last 30 days:  No results found.  Assessment/Plan  1. Lobar pneumonia ([place]) Improvement noted, has not needed oxygen, minimal cough Continue Levaquin to complete 7 day course  2. Oropharyngeal dysphagia ST eval and treat due to pocketing pills and food.   3. Weakness Due to recent infection and decrease appetite.  Remains ambulatory but needs assistance.  Check BMP   4. Acute bacterial conjunctivitis of both eyes Only mild improvement with oral antibiotic and has spread to both eyes.  - gentamicin ointment (GARAMYCIN) 0.1 %; Apply 1 application topically in the morning and at bedtime for 7 days.  Dispense: 14 g; Refill: 0   Family/ staff Communication: nurse  Labs/tests ordered:   BMP

## 2021-10-22 ENCOUNTER — Other Ambulatory Visit: Payer: Self-pay | Admitting: Orthopedic Surgery

## 2021-10-22 DIAGNOSIS — R531 Weakness: Secondary | ICD-10-CM | POA: Diagnosis not present

## 2021-10-22 LAB — BASIC METABOLIC PANEL
BUN: 23 — AB (ref 4–21)
CO2: 18 (ref 13–22)
Chloride: 110 — AB (ref 99–108)
Creatinine: 0.9 (ref 0.5–1.1)
Glucose: 97
Potassium: 4.8 (ref 3.4–5.3)
Sodium: 148 — AB (ref 137–147)

## 2021-10-22 LAB — COMPREHENSIVE METABOLIC PANEL: Calcium: 9.8 (ref 8.7–10.7)

## 2021-10-22 NOTE — Progress Notes (Signed)
Gentamycin eyedrop unavailable per pharmacy. Replaced with tobramycin 0.3% ointment.

## 2021-10-24 DIAGNOSIS — R011 Cardiac murmur, unspecified: Secondary | ICD-10-CM | POA: Diagnosis not present

## 2021-10-24 LAB — BASIC METABOLIC PANEL
BUN: 23 — AB (ref 4–21)
CO2: 32 — AB (ref 13–22)
Chloride: 106 (ref 99–108)
Creatinine: 0.9 (ref 0.5–1.1)
Glucose: 95
Potassium: 3.8 (ref 3.4–5.3)
Sodium: 144 (ref 137–147)

## 2021-10-24 LAB — COMPREHENSIVE METABOLIC PANEL: Calcium: 9.7 (ref 8.7–10.7)

## 2021-11-14 ENCOUNTER — Encounter: Payer: Self-pay | Admitting: Adult Health

## 2021-11-14 ENCOUNTER — Non-Acute Institutional Stay: Payer: Medicare Other | Admitting: Adult Health

## 2021-11-14 DIAGNOSIS — E7800 Pure hypercholesterolemia, unspecified: Secondary | ICD-10-CM

## 2021-11-14 DIAGNOSIS — J181 Lobar pneumonia, unspecified organism: Secondary | ICD-10-CM | POA: Diagnosis not present

## 2021-11-14 DIAGNOSIS — F0280 Dementia in other diseases classified elsewhere without behavioral disturbance: Secondary | ICD-10-CM

## 2021-11-14 DIAGNOSIS — F5101 Primary insomnia: Secondary | ICD-10-CM

## 2021-11-14 DIAGNOSIS — M816 Localized osteoporosis [Lequesne]: Secondary | ICD-10-CM | POA: Diagnosis not present

## 2021-11-14 DIAGNOSIS — R052 Subacute cough: Secondary | ICD-10-CM | POA: Diagnosis not present

## 2021-11-14 DIAGNOSIS — G309 Alzheimer's disease, unspecified: Secondary | ICD-10-CM

## 2021-11-14 NOTE — Progress Notes (Signed)
Location:  Occupational psychologist of Service:  ALF (13) Provider:   Cindi Carbon, [place] [phone]   Virgie Dad, MD  Patient Care Team: Virgie Dad, MD as PCP - General (Internal Medicine) Addison Lank, MD as Consulting Physician (Dermatology)  Extended Emergency Contact Information Primary Emergency Contact: Spikes,[name] Address: [address] [place], [place] [address] [name] of [place] Phone: [phone] Relation: Son  Code Status:  DNR Goals of care: Advanced Directive information Advanced Directives 10/21/2021  Does Patient Have a Medical Advance Directive? Yes  Type of Paramedic of [place];Living will  Does patient want to make changes to medical advance directive? No - Patient declined  Copy of [place] in Chart? Yes - validated most recent copy scanned in chart (See row information)  Would patient like information on creating a medical advance directive? -  Pre-existing out of facility DNR order (yellow form or pink MOST form) -     Chief Complaint  Patient presents with   Medical Management of Chronic Issues    HPI:  Pt is a 87 y.o. female seen today for medical management of chronic diseases.   PMH significant for Alzheimer's disease, hyperlipidemia, uterine cancer status post hysterectomy, mild aphasia, and wandering behavior.  Treated for right middle and lower lobe pna with levaquin 10/17/21. She is much improved with less cough and congestion. Still has a slight nasal congestion and slight cough.   Currently seeing ST due to dysphagia and cough. No changed in diet. No current issues with tolerating regular food.   Since her episode of pna she is walking slower but remains ambulatory. Not able to answer q's. Counts repetitively. Remains pleasant.  Past Medical History:  Diagnosis Date   Alzheimer's disease ([place])    High cholesterol     Uterine cancer [facility])    Past Surgical History:  Procedure Laterality Date   [facility]   cataract surgery Bilateral     Allergies  Allergen Reactions   Penicillins Hives   Sulfa Antibiotics Hives    Outpatient Encounter Medications as of 11/14/2021  Medication Sig   Cholecalciferol (VITAMIN D3) 2000 units TABS Take 1 tablet by mouth daily.   ipratropium-albuterol (DUONEB) 0.5-2.5 (3) MG/3ML SOLN Take 3 mLs by nebulization 3 (three) times daily as needed.   Melatonin 1 MG TABS Take by mouth at bedtime.   memantine (NAMENDA XR) 28 MG CP24 24 hr capsule Take 1 capsule (28 mg total) by mouth daily.   rivastigmine (EXELON) 3 MG capsule Take 1 capsule (3 mg total) by mouth 2 (two) times daily.   simvastatin (ZOCOR) 20 MG tablet TAKE 1 TABLET DAILY   No facility-administered encounter medications on file as of 11/14/2021.    Review of Systems  Unable to perform ROS: Dementia   Immunization History  Administered Date(s) Administered   Influenza, High Dose Seasonal PF 10/07/2019   Influenza,inj,Quad PF,6+ Mos 10/22/2018   Influenza-Unspecified 10/31/2015, 10/23/2016, 10/19/2017, 10/23/2020   Moderna Sars-Covid-2 Vaccination 01/31/2020, 02/28/2020, 10/16/2020   Pneumococcal Conjugate-13 06/16/2018   Pneumococcal Polysaccharide-23 02/04/2012   Tdap 10/16/2018   Zoster Recombinat (Shingrix) 07/10/2017, 11/13/2017, 06/27/2018   Pertinent  Health Maintenance Due  Topic Date Due   INFLUENZA VACCINE  07/29/2021   DEXA SCAN  Completed   COLONOSCOPY (Pts 45-80yrs Insurance coverage will need to be confirmed)  Discontinued   Fall Risk 02/23/2019 04/27/2019 08/24/2019 10/24/2019 03/08/2021  Falls in the past year? 1 1 0 1 0  Was there an injury with Fall? 1 1 0 1 0  Fall Risk Category Calculator 2 2 0 2 0  Fall Risk Category Moderate Moderate Low Moderate Low  Patient Fall Risk Level Moderate fall risk Moderate fall risk Low fall risk - Low fall risk   Patient at Risk for Falls Due to History of fall(s);Impaired balance/gait;Impaired mobility;Mental status change;Medication side effect - - - -  Fall risk Follow up Falls evaluation completed;Education provided;Falls prevention discussed - - - -  Fall risk Follow up PT referral - - - -   Functional Status Survey:    Vitals:   11/14/21 1116  Weight: 129 lb 6.4 oz (58.7 kg)   Body mass index is 24.45 kg/m. Physical Exam Vitals and nursing note reviewed.  Constitutional:      General: She is not in acute distress.    Appearance: She is not diaphoretic.  HENT:     Head: Normocephalic and atraumatic.     Nose: Congestion present.     Mouth/Throat:     Mouth: Mucous membranes are moist.     Pharynx: Oropharynx is clear.  Eyes:     Conjunctiva/sclera: Conjunctivae normal.     Pupils: Pupils are equal, round, and reactive to light.  Neck:     Vascular: No JVD.  Cardiovascular:     Rate and Rhythm: Normal rate and regular rhythm.     Heart sounds: No murmur heard. Pulmonary:     Effort: Pulmonary effort is normal. No respiratory distress.     Breath sounds: Normal breath sounds. No wheezing.  Abdominal:     General: Bowel sounds are normal. There is no distension.     Palpations: Abdomen is soft.     Tenderness: There is no abdominal tenderness.  Musculoskeletal:        General: No swelling, tenderness, deformity or signs of injury.     Cervical back: No rigidity or tenderness.     Right lower leg: No edema.     Left lower leg: No edema.  Lymphadenopathy:     Cervical: No cervical adenopathy.  Skin:    General: Skin is warm and dry.  Neurological:     General: No focal deficit present.     Mental Status: She is alert. Mental status is at baseline.    Labs reviewed: Recent Labs    01/07/21 0000 10/22/21 0000 10/24/21 0000  NA 145 148* 144  K 4.4 4.8 3.8  CL 108 110* 106  CO2 26* 18 32*  BUN 21 23* 23*  CREATININE 0.8 0.9 0.9  CALCIUM 9.6 9.8 9.7   No results  for input(s): AST, ALT, ALKPHOS, BILITOT, PROT, ALBUMIN in the last 8760 hours. Recent Labs    01/07/21 0000 10/17/21 0000  WBC 5.3 10.3  HGB 13.0 12.5  HCT 37 38  PLT 202 265   Lab Results  Component Value Date   TSH 3.70 02/24/2017   No results found for: HGBA1C Lab Results  Component Value Date   CHOL 152 01/07/2021   HDL 48 01/07/2021   LDLCALC 85 01/07/2021   TRIG 96 01/07/2021    Significant Diagnostic Results in last 30 days:  No results found.  Assessment/Plan  1. Lobar pneumonia ([place]) Improved.   2. Subacute cough Improved Continues to work with [place] as needed  3. Alzheimer's disease ([place])  Moderate to severe Continue Namenda and Exelon  4. Pure hypercholesterolemia Lab Results  Component Value Date   LDLCALC 85 01/07/2021   Continue zocor  5. Primary insomnia Continue melatonin  6. Localized osteoporosis without current pathological fracture BMD 08/22/21 T score -2.8 Continue Vit D Not able to take Ca (won't swallow or chew) Would not pursue additional testing due to her dementia and skilled status   Family/ staff Communication: resident   Labs/tests ordered:  NA

## 2021-12-13 DIAGNOSIS — R051 Acute cough: Secondary | ICD-10-CM | POA: Diagnosis not present

## 2022-01-02 ENCOUNTER — Encounter: Payer: Self-pay | Admitting: Adult Health

## 2022-01-02 ENCOUNTER — Non-Acute Institutional Stay (SKILLED_NURSING_FACILITY): Payer: Medicare Other | Admitting: Adult Health

## 2022-01-02 DIAGNOSIS — M816 Localized osteoporosis [Lequesne]: Secondary | ICD-10-CM

## 2022-01-02 DIAGNOSIS — F02818 Dementia in other diseases classified elsewhere, unspecified severity, with other behavioral disturbance: Secondary | ICD-10-CM

## 2022-01-02 DIAGNOSIS — F5101 Primary insomnia: Secondary | ICD-10-CM

## 2022-01-02 DIAGNOSIS — G301 Alzheimer's disease with late onset: Secondary | ICD-10-CM

## 2022-01-02 DIAGNOSIS — N3281 Overactive bladder: Secondary | ICD-10-CM

## 2022-01-02 DIAGNOSIS — E7800 Pure hypercholesterolemia, unspecified: Secondary | ICD-10-CM | POA: Diagnosis not present

## 2022-01-02 DIAGNOSIS — R4689 Other symptoms and signs involving appearance and behavior: Secondary | ICD-10-CM | POA: Diagnosis not present

## 2022-01-02 NOTE — Progress Notes (Signed)
Location:   [place] Room Number: 170 Place of Service:  SNF [phone]) Provider:  Royal Hawthorn, NP    Virgie Dad, MD  Patient Care Team: Virgie Dad, MD as PCP - General (Internal Medicine) Addison Lank, MD as Consulting Physician (Dermatology)  Extended Emergency Contact Information Primary Emergency Contact: Janusz,[name] Address: [address] [place], [place] [address] [name] of [place] Phone: [phone] Relation: Son  Code Status:  DNR Goals of care: Advanced Directive information Advanced Directives 01/02/2022  Does Patient Have a Medical Advance Directive? Yes  Type of Advance Directive Living will  Does patient want to make changes to medical advance directive? No - Patient declined  Copy of [place] in Chart? -  Would patient like information on creating a medical advance directive? -  Pre-existing out of facility DNR order (yellow form or pink MOST form) -     Chief Complaint  Patient presents with   Medical Management of Chronic Issues   Quality Metric Gaps    Covid vaccine     HPI:  Pt is a 88 y.o. female seen today for medical management of chronic diseases.   PMH significant for Alzheimer's disease, hyperlipidemia, uterine cancer status post hysterectomy, mild aphasia, and wandering behavior. She is not able to complete an MMSE. She continues to ambulate but her gait is slower since she had pna. She is not having any issues with chewing or swallowing. No behavioral concerns. Continues to count repetitively. Remains pleasant. Weight is up 4 lbs.  Wt Readings from Last 3 Encounters:  01/02/22 133 lb 6.4 oz (60.5 kg)  11/14/21 129 lb 6.4 oz (58.7 kg)  10/21/21 132 lb 8 oz (60.1 kg)    BPs reviewed in matrix  Blood Pressure: 136 / 72 mmHg  Blood Pressure: 134 / 70 mmHg   Blood Pressure: 116 / 66 mmHg  Blood Pressure: 146 / 72 mmH  Past Medical History:   Diagnosis Date   Alzheimer's disease ([place])    High cholesterol    Uterine cancer ([place])    Past Surgical History:  Procedure Laterality Date   [facility]   cataract surgery Bilateral     Allergies  Allergen Reactions   Penicillins Hives   Sulfa Antibiotics Hives    Allergies as of 01/02/2022       Reactions   Penicillins Hives   Sulfa Antibiotics Hives        Medication List        Accurate as of January 02, 2022 10:32 AM. If you have any questions, ask your nurse or doctor.          ipratropium-albuterol 0.5-2.5 (3) MG/3ML Soln Commonly known as: DUONEB Take 3 mLs by nebulization 3 (three) times daily as needed.   melatonin 1 MG Tabs tablet Take by mouth at bedtime.   memantine 28 MG Cp24 24 hr capsule Commonly known as: NAMENDA XR Take 1 capsule (28 mg total) by mouth daily.   rivastigmine 3 MG capsule Commonly known as: Exelon Take 1 capsule (3 mg total) by mouth 2 (two) times daily.   simvastatin 20 MG tablet Commonly known as: ZOCOR TAKE 1 TABLET DAILY   Vitamin D3 50 MCG (2000 UT) Tabs Take 1 tablet by mouth daily.        Review of Systems  Unable to perform ROS: Dementia  Immunization History  Administered Date(s) Administered   Influenza, High Dose Seasonal PF 10/07/2019   Influenza,inj,Quad PF,6+ Mos 10/22/2018   Influenza-Unspecified 10/31/2015, 10/23/2016, 10/19/2017, 10/23/2020, 10/02/2021   Moderna SARS-COV2 Booster Vaccination 10/09/2021   Moderna Sars-Covid-2 Vaccination 01/31/2020, 02/28/2020, 10/16/2020   Pneumococcal Conjugate-13 06/16/2018   Pneumococcal Polysaccharide-23 02/04/2012   Tdap 10/16/2018   Zoster Recombinat (Shingrix) 07/10/2017, 11/13/2017, 06/27/2018   Pertinent  Health Maintenance Due  Topic Date Due   INFLUENZA VACCINE  Completed   DEXA SCAN  Completed   COLONOSCOPY (Pts 45-42yrs Insurance coverage will need to be confirmed)  Discontinued   Fall Risk 02/23/2019  04/27/2019 08/24/2019 10/24/2019 03/08/2021  Falls in the past year? 1 1 0 1 0  Was there an injury with Fall? 1 1 0 1 0  Fall Risk Category Calculator 2 2 0 2 0  Fall Risk Category Moderate Moderate Low Moderate Low  Patient Fall Risk Level Moderate fall risk Moderate fall risk Low fall risk - Low fall risk  Patient at Risk for Falls Due to History of fall(s);Impaired balance/gait;Impaired mobility;Mental status change;Medication side effect - - - -  Fall risk Follow up Falls evaluation completed;Education provided;Falls prevention discussed - - - -  Fall risk Follow up PT referral - - - -   Functional Status Survey:    Vitals:   01/02/22 1028  BP: 136/72  Pulse: 74  Resp: 18  Temp: (!) 97.2 F (36.2 C)  SpO2: 100%  Weight: 133 lb 6.4 oz (60.5 kg)  Height: 5\' 1"  (1.549 m)   Body mass index is 25.21 kg/m. Physical Exam Vitals and nursing note reviewed.  Constitutional:      General: She is not in acute distress.    Appearance: She is not diaphoretic.  HENT:     Head: Normocephalic and atraumatic.     Right Ear: Tympanic membrane normal.     Left Ear: Tympanic membrane normal.     Nose: Nose normal.     Mouth/Throat:     Mouth: Mucous membranes are moist.     Pharynx: Oropharynx is clear.  Eyes:     Conjunctiva/sclera: Conjunctivae normal.     Pupils: Pupils are equal, round, and reactive to light.  Neck:     Vascular: No JVD.  Cardiovascular:     Rate and Rhythm: Normal rate and regular rhythm.     Heart sounds: No murmur heard. Pulmonary:     Effort: Pulmonary effort is normal. No respiratory distress.     Breath sounds: Normal breath sounds. No wheezing.  Abdominal:     General: Bowel sounds are normal. There is no distension.     Palpations: Abdomen is soft.     Tenderness: There is no abdominal tenderness.  Skin:    General: Skin is warm and dry.  Neurological:     General: No focal deficit present.     Mental Status: She is alert. Mental status is at  baseline.  Psychiatric:        Mood and Affect: Mood normal.    Labs reviewed: Recent Labs    01/07/21 0000 10/22/21 0000 10/24/21 0000  NA 145 148* 144  K 4.4 4.8 3.8  CL 108 110* 106  CO2 26* 18 32*  BUN 21 23* 23*  CREATININE 0.8 0.9 0.9  CALCIUM 9.6 9.8 9.7   No results for input(s): AST, ALT, ALKPHOS, BILITOT, PROT, ALBUMIN in the last 8760 hours. Recent Labs    01/07/21 0000 10/17/21 0000  WBC  5.3 10.3  HGB 13.0 12.5  HCT 37 38  PLT 202 265   Lab Results  Component Value Date   TSH 3.70 02/24/2017   No results found for: HGBA1C Lab Results  Component Value Date   CHOL 152 01/07/2021   HDL 48 01/07/2021   LDLCALC 85 01/07/2021   TRIG 96 01/07/2021    Significant Diagnostic Results in last 30 days:  No results found.  Assessment/Plan  1. Dementia of the Alzheimer's type, with late onset, with delusions ([place]) Slightly slower gait other wise unchanged cognitive status. Moderate to severe stage.  Continues to benefit from the memory care setting.  Continue exelon and namenda.   2. Localized osteoporosis without current pathological fracture Not able to swallow or chew calcium No longer getting dexa scans due to dementia   3. OAB (overactive bladder) No current complaints.   4. Wandering behavior Due to #1  5. Primary insomnia No reported issues sleeping Continue melatonin   6. Pure hypercholesterolemia Lab Results  Component Value Date   [place] 85 01/07/2021   Continue zocor    Family/ staff Communication:  Resident  Labs/tests ordered:   NA

## 2022-01-03 ENCOUNTER — Encounter: Payer: Self-pay | Admitting: Adult Health

## 2022-01-17 DIAGNOSIS — R062 Wheezing: Secondary | ICD-10-CM | POA: Diagnosis not present

## 2022-01-17 LAB — CBC: RBC: 3.99 (ref 3.87–5.11)

## 2022-01-17 LAB — CBC AND DIFFERENTIAL
HCT: 37 (ref 36–46)
Hemoglobin: 12.6 (ref 12.0–16.0)
Platelets: 201 (ref 150–399)
WBC: 5.2

## 2022-02-07 ENCOUNTER — Non-Acute Institutional Stay (SKILLED_NURSING_FACILITY): Payer: Medicare Other | Admitting: Adult Health

## 2022-02-07 ENCOUNTER — Encounter: Payer: Self-pay | Admitting: Adult Health

## 2022-02-07 DIAGNOSIS — N951 Menopausal and female climacteric states: Secondary | ICD-10-CM | POA: Diagnosis not present

## 2022-02-07 DIAGNOSIS — H1031 Unspecified acute conjunctivitis, right eye: Secondary | ICD-10-CM

## 2022-02-07 NOTE — Progress Notes (Signed)
Location:   [place] Room Number: 924 Place of Service:  SNF [phone]) Provider:  Royal Hawthorn, NP  Virgie Dad, MD  Patient Care Team: Virgie Dad, MD as PCP - General (Internal Medicine) Addison Lank, MD as Consulting Physician (Dermatology)  Extended Emergency Contact Information Primary Emergency Contact: Mihalko,[name] Address: [address] [place], [place] [address] [name] of [place] Phone: [phone] Relation: Son  Code Status:  DNR Goals of care: Advanced Directive information Advanced Directives 02/07/2022  Does Patient Have a Medical Advance Directive? Yes  Type of Advance Directive Living will  Does patient want to make changes to medical advance directive? No - Patient declined  Copy of [place] in Chart? -  Would patient like information on creating a medical advance directive? -  Pre-existing out of facility DNR order (yellow form or pink MOST form) -     Chief Complaint  Patient presents with   Acute Visit    Eye drainage     HPI:  Pt is a 88 y.o. female seen today for an acute visit for eye drainage. Nurse reports the right eye has green discharge present for 1-2 days and mild erythema. She has dementia and rubs her eye a lot. Was treated one month ago for conjunctivitis with erythromycin. No change in vision at this time, no complaints of discomfort. Staff trying to keep her hands clean due to her dementia she is not able to remember to wash them. They are also reporting that they see her putting her hand down her pants and scratching. No vaginal discharge is reported. No rash. Pt can not contribute to the hx.    Past Medical History:  Diagnosis Date   Alzheimer's disease ([place])    High cholesterol    Uterine cancer [facility])    Past Surgical History:  Procedure Laterality Date   [facility]   cataract surgery Bilateral      Allergies  Allergen Reactions   Penicillins Hives   Sulfa Antibiotics Hives    Allergies as of 02/07/2022       Reactions   Penicillins Hives   Sulfa Antibiotics Hives        Medication List        Accurate as of February 07, 2022 11:24 AM. If you have any questions, ask your nurse or doctor.          Artificial Tears 0.1-0.3 % Soln Generic drug: Dextran 70-Hypromellose Apply to eye in the morning, at noon, and at bedtime.   ipratropium-albuterol 0.5-2.5 (3) MG/3ML Soln Commonly known as: DUONEB Take 3 mLs by nebulization 3 (three) times daily as needed.   melatonin 1 MG Tabs tablet Take by mouth at bedtime.   memantine 28 MG Cp24 24 hr capsule Commonly known as: NAMENDA XR Take 1 capsule (28 mg total) by mouth daily.   rivastigmine 3 MG capsule Commonly known as: Exelon Take 1 capsule (3 mg total) by mouth 2 (two) times daily.   simvastatin 20 MG tablet Commonly known as: ZOCOR TAKE 1 TABLET DAILY   Vitamin D3 50 MCG (2000 UT) Tabs Take 1 tablet by mouth daily.        Review of Systems  Unable to perform ROS: Dementia   Immunization History  Administered Date(s) Administered   Influenza, High Dose Seasonal PF 10/07/2019   Influenza,inj,Quad PF,6+ Mos 10/22/2018  Influenza-Unspecified 10/31/2015, 10/23/2016, 10/19/2017, 10/23/2020, 10/02/2021   Moderna SARS-COV2 Booster Vaccination 10/09/2021   Moderna Sars-Covid-2 Vaccination 01/31/2020, 02/28/2020, 10/16/2020   Pneumococcal Conjugate-13 06/16/2018   Pneumococcal Polysaccharide-23 02/04/2012   Tdap 10/16/2018   Zoster Recombinat (Shingrix) 07/10/2017, 11/13/2017, 06/27/2018   Pertinent  Health Maintenance Due  Topic Date Due   INFLUENZA VACCINE  Completed   DEXA SCAN  Completed   COLONOSCOPY (Pts 45-61yrs Insurance coverage will need to be confirmed)  Discontinued   Fall Risk 02/23/2019 04/27/2019 08/24/2019 10/24/2019 03/08/2021  Falls in the past year? 1 1 0 1 0  Was there an injury  with Fall? 1 1 0 1 0  Fall Risk Category Calculator 2 2 0 2 0  Fall Risk Category Moderate Moderate Low Moderate Low  Patient Fall Risk Level Moderate fall risk Moderate fall risk Low fall risk - Low fall risk  Patient at Risk for Falls Due to History of fall(s);Impaired balance/gait;Impaired mobility;Mental status change;Medication side effect - - - -  Fall risk Follow up Falls evaluation completed;Education provided;Falls prevention discussed - - - -  Fall risk Follow up PT referral - - - -   Functional Status Survey:    Vitals:   02/07/22 1112  BP: 134/72  Pulse: 64  Resp: 16  Temp: 97.7 F (36.5 C)  SpO2: 98%  Weight: 131 lb 12.8 oz (59.8 kg)  Height: 5\' 1"  (1.549 m)   Body mass index is 24.9 kg/m. Physical Exam Vitals and nursing note reviewed.  Eyes:     General:        Right eye: Discharge (clear discharge and sm amt of yellow discharge) present.        Left eye: No discharge.     Pupils: Pupils are equal, round, and reactive to light.     Comments: Right conjunctiva with mild erythema  Genitourinary:    General: Normal vulva.     Vagina: No vaginal discharge.     Rectum: Normal.     Comments: No rash or discharge. Area appears dry with some atrophy   Labs reviewed: Recent Labs    10/22/21 0000 10/24/21 0000  NA 148* 144  K 4.8 3.8  CL 110* 106  CO2 18 32*  BUN 23* 23*  CREATININE 0.9 0.9  CALCIUM 9.8 9.7   No results for input(s): AST, ALT, ALKPHOS, BILITOT, PROT, ALBUMIN in the last 8760 hours. Recent Labs    10/17/21 0000 01/17/22 0000  WBC 10.3 5.2  HGB 12.5 12.6  HCT 38 37  PLT 265 201   Lab Results  Component Value Date   TSH 3.70 02/24/2017   No results found for: HGBA1C Lab Results  Component Value Date   CHOL 152 01/07/2021   HDL 48 01/07/2021   LDLCALC 85 01/07/2021   TRIG 96 01/07/2021    Significant Diagnostic Results in last 30 days:  No results found.  Assessment/Plan  1. Acute conjunctivitis of right eye,  unspecified acute conjunctivitis type Very mildly red with only a small amt of discharge Will try artificial tears 2 to the right eye for three days and report if not improving.   2. Vaginal dryness, menopausal No sign of yeast infection Does appear dry Could try lubricant if this continues to bother her.    Family/ staff Communication:   Labs/tests ordered:

## 2022-02-24 DIAGNOSIS — Z961 Presence of intraocular lens: Secondary | ICD-10-CM | POA: Diagnosis not present

## 2022-03-10 ENCOUNTER — Encounter: Payer: Self-pay | Admitting: Internal Medicine

## 2022-03-10 ENCOUNTER — Non-Acute Institutional Stay (SKILLED_NURSING_FACILITY): Payer: Medicare Other | Admitting: Internal Medicine

## 2022-03-10 DIAGNOSIS — F02818 Dementia in other diseases classified elsewhere, unspecified severity, with other behavioral disturbance: Secondary | ICD-10-CM

## 2022-03-10 DIAGNOSIS — G301 Alzheimer's disease with late onset: Secondary | ICD-10-CM

## 2022-03-10 DIAGNOSIS — F5101 Primary insomnia: Secondary | ICD-10-CM

## 2022-03-10 DIAGNOSIS — M816 Localized osteoporosis [Lequesne]: Secondary | ICD-10-CM

## 2022-03-10 DIAGNOSIS — E7800 Pure hypercholesterolemia, unspecified: Secondary | ICD-10-CM | POA: Diagnosis not present

## 2022-03-10 NOTE — Progress Notes (Unsigned)
Location:   [place] Room Number: 448 Place of Service:  SNF [phone]) Provider:  Veleta Miners MD   Virgie Dad, MD  Patient Care Team: Virgie Dad, MD as PCP - General (Internal Medicine) Addison Lank, MD as Consulting Physician (Dermatology)  Extended Emergency Contact Information Primary Emergency Contact: Herald,[name] Address: [address] [place], [place] [address] [name] of [place] Phone: [phone] Relation: Son  Code Status:  DNR Goals of care: Advanced Directive information Advanced Directives 03/10/2022  Does Patient Have a Medical Advance Directive? Yes  Type of Advance Directive Living will  Does patient want to make changes to medical advance directive? No - Patient declined  Copy of [place] in Chart? -  Would patient like information on creating a medical advance directive? -  Pre-existing out of facility DNR order (yellow form or pink MOST form) -     Chief Complaint  Patient presents with   Medical Management of Chronic Issues   Quality Metric Gaps    Verified [facility] and [facility] patient is due for #5 covid -19 vaccine    HPI:  Pt is a 88 y.o. female seen today for medical management of chronic diseases.     Past Medical History:  Diagnosis Date   Alzheimer's disease ([place])    High cholesterol    Uterine cancer [facility])    Past Surgical History:  Procedure Laterality Date   [facility]   cataract surgery Bilateral     Allergies  Allergen Reactions   Penicillins Hives   Sulfa Antibiotics Hives    Allergies as of 03/10/2022       Reactions   Penicillins Hives   Sulfa Antibiotics Hives        Medication List        Accurate as of March 10, 2022  4:37 PM. If you have any questions, ask your nurse or doctor.          STOP taking these medications    Artificial Tears 0.1-0.3 % Soln Generic drug: Dextran  70-Hypromellose Stopped by: Virgie Dad, MD       TAKE these medications    ipratropium-albuterol 0.5-2.5 (3) MG/3ML Soln Commonly known as: DUONEB Take 3 mLs by nebulization 3 (three) times daily as needed.   melatonin 1 MG Tabs tablet Take by mouth at bedtime.   memantine 28 MG Cp24 24 hr capsule Commonly known as: NAMENDA XR Take 1 capsule (28 mg total) by mouth daily.   rivastigmine 3 MG capsule Commonly known as: Exelon Take 1 capsule (3 mg total) by mouth 2 (two) times daily.   simvastatin 20 MG tablet Commonly known as: ZOCOR TAKE 1 TABLET DAILY   Vitamin D3 50 MCG (2000 UT) Tabs Take 1 tablet by mouth daily.        Review of Systems  Immunization History  Administered Date(s) Administered   Influenza, High Dose Seasonal PF 10/07/2019   Influenza,inj,Quad PF,6+ Mos 10/22/2018   Influenza-Unspecified 10/31/2015, 10/23/2016, 10/19/2017, 10/23/2020, 10/02/2021   Moderna SARS-COV2 Booster Vaccination 10/09/2021   Moderna Sars-Covid-2 Vaccination 01/31/2020, 02/28/2020, 10/16/2020   Pneumococcal Conjugate-13 06/16/2018   Pneumococcal Polysaccharide-23 02/04/2012   Tdap 10/16/2018   Zoster Recombinat (Shingrix) 07/10/2017, 11/13/2017, 06/27/2018   Pertinent  Health Maintenance Due  Topic Date Due   INFLUENZA VACCINE  Completed   DEXA SCAN  Completed   COLONOSCOPY (  Pts 45-27yr Insurance coverage will need to be confirmed)  Discontinued   Fall Risk 02/23/2019 04/27/2019 08/24/2019 10/24/2019 03/08/2021  Falls in the past year? 1 1 0 1 0  Was there an injury with Fall? 1 1 0 1 0  Fall Risk Category Calculator 2 2 0 2 0  Fall Risk Category Moderate Moderate Low Moderate Low  Patient Fall Risk Level Moderate fall risk Moderate fall risk Low fall risk - Low fall risk  Patient at Risk for Falls Due to History of fall(s);Impaired balance/gait;Impaired mobility;Mental status change;Medication side effect - - - -  Fall risk Follow up Falls evaluation  completed;Education provided;Falls prevention discussed - - - -  Fall risk Follow up PT referral - - - -   Functional Status Survey:    Vitals:   03/10/22 1632  BP: 132/78  Pulse: 68  Resp: 16  Temp: 97.9 F (36.6 C)  SpO2: 96%  Weight: 130 lb 6.4 oz (59.1 kg)  Height: '5\' 1"'$  (1.549 m)   Body mass index is 24.64 kg/m. Physical Exam  Labs reviewed: Recent Labs    10/22/21 0000 10/24/21 0000  NA 148* 144  K 4.8 3.8  CL 110* 106  CO2 18 32*  BUN 23* 23*  CREATININE 0.9 0.9  CALCIUM 9.8 9.7   No results for input(s): AST, ALT, ALKPHOS, BILITOT, PROT, ALBUMIN in the last 8760 hours. Recent Labs    10/17/21 0000 01/17/22 0000  WBC 10.3 5.2  HGB 12.5 12.6  HCT 38 37  PLT 265 201   Lab Results  Component Value Date   TSH 3.70 02/24/2017   No results found for: HGBA1C Lab Results  Component Value Date   CHOL 152 01/07/2021   HDL 48 01/07/2021   LDLCALC 85 01/07/2021   TRIG 96 01/07/2021    Significant Diagnostic Results in last 30 days:  No results found.  Assessment/Plan There are no diagnoses linked to this encounter.   Family/ staff Communication:   Labs/tests ordered:

## 2022-05-01 ENCOUNTER — Non-Acute Institutional Stay: Payer: Medicare Other | Admitting: Adult Health

## 2022-05-01 ENCOUNTER — Encounter: Payer: Self-pay | Admitting: Adult Health

## 2022-05-01 DIAGNOSIS — R2681 Unsteadiness on feet: Secondary | ICD-10-CM

## 2022-05-01 DIAGNOSIS — G301 Alzheimer's disease with late onset: Secondary | ICD-10-CM | POA: Diagnosis not present

## 2022-05-01 DIAGNOSIS — M816 Localized osteoporosis [Lequesne]: Secondary | ICD-10-CM | POA: Diagnosis not present

## 2022-05-01 DIAGNOSIS — E7800 Pure hypercholesterolemia, unspecified: Secondary | ICD-10-CM | POA: Diagnosis not present

## 2022-05-01 DIAGNOSIS — R635 Abnormal weight gain: Secondary | ICD-10-CM

## 2022-05-01 DIAGNOSIS — F02818 Dementia in other diseases classified elsewhere, unspecified severity, with other behavioral disturbance: Secondary | ICD-10-CM

## 2022-05-01 NOTE — Progress Notes (Addendum)
?Location:  [place] ?Nursing Home Room Number: 306-A ?Place of Service:  ALF (13) ?Provider:  Cleophus Molt ? ?Virgie Dad, MD ? ?Patient Care Team: ?Virgie Dad, MD as PCP - General (Internal Medicine) ?Addison Lank, MD as Consulting Physician (Dermatology) ? ?Extended Emergency Contact Information ?Primary Emergency Contact: Lavey,[name] ?Address: [address] ?         [place], [place] [address] [place] ?Home Phone: [phone] ?Relation: Son ? ?Code Status:  DNR ?Goals of care: Advanced Directive information ? ?  03/10/2022  ?  4:37 PM  ?Advanced Directives  ?Does Patient Have a Medical Advance Directive? Yes  ?Type of Advance Directive Living will  ?Does patient want to make changes to medical advance directive? No - Patient declined  ? ? ? ?Chief Complaint  ?Patient presents with  ? Routine  ? ? ?HPI:  ?Pt is a 88 y.o. female seen today for medical management of chronic diseases.   ?PMH significant for Alzheimer's disease, hyperlipidemia, uterine cancer status post hysterectomy, mild aphasia, and wandering behavior. ?Nurse reports that she is eating more and gaining weight. She is also more likely to swallow her pills ?Functionally she is declining and has needed to use a wheelchair. Can stand and pivot.  ?Wt Readings from Last 3 Encounters:  ?05/01/22 138 lb 6.4 oz (62.8 kg)  ?03/10/22 130 lb 6.4 oz (59.1 kg)  ?02/07/22 131 lb 12.8 oz (59.8 kg)  ?She had a fall several days ago with no significant injury ? ?Bps reviewed in matrix.  ?Blood Pressure: 144 / 67 mmHg ? ?Blood Pressure: 122 / 72 mmHg ? ?Blood Pressure: 136 / 76 mmHg ? ?Blood Pressure: 132 / 72 mmHg ? ?Blood Pressure: 132 / 74 mmHg ? ?Blood Pressure: 136 / 66 mmHg ? ?Past Medical History:  ?Diagnosis Date  ? Alzheimer's disease ([place])   ? High cholesterol   ? Uterine cancer ([place])   ? ?Past Surgical History:  ?Procedure Laterality Date  ? ABDOMINAL HYSTERECTOMY  1997  ? [facility]  ?  cataract surgery Bilateral   ? ? ?Allergies  ?Allergen Reactions  ? Penicillins Hives  ? Sulfa Antibiotics Hives  ? ? ?Outpatient Encounter Medications as of 05/01/2022  ?Medication Sig  ? acetaminophen (TYLENOL) 325 MG tablet Take 650 mg by mouth as needed.  ? Cholecalciferol (VITAMIN D3) 2000 units TABS Take 1 tablet by mouth daily.  ? ipratropium-albuterol (DUONEB) 0.5-2.5 (3) MG/3ML SOLN Take 3 mLs by nebulization 3 (three) times daily as needed.  ? Melatonin 1 MG TABS Take by mouth at bedtime.  ? memantine (NAMENDA XR) 28 MG CP24 24 hr capsule Take 1 capsule (28 mg total) by mouth daily.  ? rivastigmine (EXELON) 3 MG capsule Take 1 capsule (3 mg total) by mouth 2 (two) times daily.  ? simvastatin (ZOCOR) 20 MG tablet TAKE 1 TABLET DAILY  ? ?No facility-administered encounter medications on file as of 05/01/2022.  ? ? ?Review of Systems  ?Unable to perform ROS: Dementia  ? ?Immunization History  ?Administered Date(s) Administered  ? Influenza, High Dose Seasonal PF 10/07/2019  ? Influenza,inj,Quad PF,6+ Mos 10/22/2018  ? Influenza-Unspecified 10/31/2015, 10/23/2016, 10/19/2017, 10/23/2020, 10/02/2021  ? Moderna SARS-COV2 Booster Vaccination 10/09/2021, 04/24/2022  ? Moderna Sars-Covid-2 Vaccination 01/31/2020, 02/28/2020, 10/16/2020  ? Pneumococcal Conjugate-13 06/16/2018  ? Pneumococcal Polysaccharide-23 02/04/2012  ? Tdap 10/16/2018  ? Zoster Recombinat (Shingrix) 07/10/2017, 11/13/2017, 06/27/2018  ? ?Pertinent  Health Maintenance Due  ?Topic Date Due  ?  INFLUENZA VACCINE  07/29/2022  ? DEXA SCAN  Completed  ? COLONOSCOPY (Pts 45-81yr Insurance coverage will need to be confirmed)  Discontinued  ? ? ?  02/23/2019  ?  1:34 PM 04/27/2019  ?  9:25 AM 08/24/2019  ?  9:34 AM 10/24/2019  ? 11:00 AM 03/08/2021  ?  3:47 PM  ?Fall Risk  ?Falls in the past year? 1 1 0 1 0  ?Was there an injury with Fall? 1 1 0 1 0  ?Fall Risk Category Calculator 2 2 0 2 0  ?Fall Risk Category Moderate Moderate Low Moderate Low  ?Patient Fall  Risk Level Moderate fall risk Moderate fall risk Low fall risk  Low fall risk  ?Patient at Risk for Falls Due to History of fall(s);Impaired balance/gait;Impaired mobility;Mental status change;Medication side effect      ?Fall risk Follow up Falls evaluation completed;Education provided;Falls prevention discussed      ?Fall risk Follow up - Comments PT referral      ? ?Functional Status Survey: ?  ? ?Vitals:  ? 05/01/22 1112  ?BP: (!) 144/67  ?Pulse: 62  ?Resp: 16  ?Temp: (!) 97.2 ?F (36.2 ?C)  ?SpO2: 90%  ?Weight: 138 lb 6.4 oz (62.8 kg)  ?Height: '5\' 1"'$  (1.549 m)  ? ?Body mass index is 26.15 kg/m?.Marland Kitchen?Physical Exam ?Vitals and nursing note reviewed.  ?Constitutional:   ?   General: She is not in acute distress. ?   Appearance: She is not diaphoretic.  ?HENT:  ?   Head: Normocephalic and atraumatic.  ?   Nose: Nose normal.  ?   Mouth/Throat:  ?   Mouth: Mucous membranes are moist.  ?   Pharynx: Oropharynx is clear.  ?Eyes:  ?   Conjunctiva/sclera: Conjunctivae normal.  ?   Pupils: Pupils are equal, round, and reactive to light.  ?Neck:  ?   Vascular: No JVD.  ?Cardiovascular:  ?   Rate and Rhythm: Normal rate and regular rhythm.  ?   Heart sounds: No murmur heard. ?Pulmonary:  ?   Effort: Pulmonary effort is normal. No respiratory distress.  ?   Breath sounds: Normal breath sounds. No wheezing.  ?Abdominal:  ?   General: Bowel sounds are normal. There is no distension.  ?   Palpations: Abdomen is soft.  ?Musculoskeletal:     ?   General: No swelling, tenderness, deformity or signs of injury.  ?   Right lower leg: No edema.  ?   Left lower leg: No edema.  ?Skin: ?   General: Skin is warm and dry.  ?Neurological:  ?   General: No focal deficit present.  ?   Mental Status: She is alert. Mental status is at baseline.  ?   Comments: Generalized weakness. Difficulty following commands. Not able to answer q's  ? ? ?Labs reviewed: ?Recent Labs  ?  10/22/21 ?0000 10/24/21 ?0000  ?NA 148* 144  ?K 4.8 3.8  ?CL 110* 106  ?CO2 18  32*  ?BUN 23* 23*  ?CREATININE 0.9 0.9  ?CALCIUM 9.8 9.7  ? ?No results for input(s): AST, ALT, ALKPHOS, BILITOT, PROT, ALBUMIN in the last 8760 hours. ?Recent Labs  ?  10/17/21 ?0000 01/17/22 ?0000  ?WBC 10.3 5.2  ?HGB 12.5 12.6  ?HCT 38 37  ?PLT 265 201  ? ?Lab Results  ?Component Value Date  ? TSH 3.70 02/24/2017  ? ?No results found for: HGBA1C ?Lab Results  ?Component Value Date  ? CHOL 152 01/07/2021  ?  HDL 48 01/07/2021  ? [place] 85 01/07/2021  ? TRIG 96 01/07/2021  ? ? ?Significant Diagnostic Results in last 30 days:  ?No results found. ? ?Assessment/Plan ? ?1. Dementia of the Alzheimer's type, with late onset, with delusions ([place]) ?Progressive decline in cognition and physical function c/w the disease. ?Continue supportive care in the skilled environment. ?Continue exelon ? ?2. Weight gain ?Due to immoblity  ?No signs of CHF ? ?3. Gait instability ?Likely due to progressive dementia ?Will check labs for other issues . ? ?4. Pure hypercholesterolemia ?On zocor ??benefit at this point, may consider discontinuing.  ? ?5. Localized osteoporosis without current pathological fracture ?Not on meds due to lack of benefit  ? ? ?Labs/tests ordered:  CBC CMP TSH ? ? ?Total time 42mn:  time greater than 50% of total time spent doing pt counseling and coordination of care  ? ? ?

## 2022-05-02 DIAGNOSIS — R531 Weakness: Secondary | ICD-10-CM | POA: Diagnosis not present

## 2022-05-02 LAB — BASIC METABOLIC PANEL
BUN: 20 (ref 4–21)
CO2: 28 — AB (ref 13–22)
Chloride: 107 (ref 99–108)
Creatinine: 0.8 (ref 0.5–1.1)
Glucose: 85
Potassium: 4.3 mEq/L (ref 3.5–5.1)
Sodium: 146 (ref 137–147)

## 2022-05-02 LAB — CBC AND DIFFERENTIAL
HCT: 38 (ref 36–46)
Hemoglobin: 12.9 (ref 12.0–16.0)
Platelets: 246 10*3/uL (ref 150–400)
WBC: 5

## 2022-05-02 LAB — CBC: RBC: 4.08 (ref 3.87–5.11)

## 2022-05-02 LAB — TSH: TSH: 3.4 (ref 0.41–5.90)

## 2022-05-02 LAB — HEPATIC FUNCTION PANEL
ALT: 12 U/L (ref 7–35)
AST: 18 (ref 13–35)
Alkaline Phosphatase: 98 (ref 25–125)
Bilirubin, Total: 0.4

## 2022-05-02 LAB — COMPREHENSIVE METABOLIC PANEL
Albumin: 0.4 — AB (ref 3.5–5.0)
Calcium: 9.8 (ref 8.7–10.7)
Globulin: 2.2

## 2022-05-30 ENCOUNTER — Encounter: Payer: Self-pay | Admitting: Adult Health

## 2022-05-30 ENCOUNTER — Non-Acute Institutional Stay: Payer: Medicare Other | Admitting: Adult Health

## 2022-05-30 DIAGNOSIS — R635 Abnormal weight gain: Secondary | ICD-10-CM

## 2022-05-30 DIAGNOSIS — G301 Alzheimer's disease with late onset: Secondary | ICD-10-CM | POA: Diagnosis not present

## 2022-05-30 DIAGNOSIS — F02818 Dementia in other diseases classified elsewhere, unspecified severity, with other behavioral disturbance: Secondary | ICD-10-CM | POA: Diagnosis not present

## 2022-05-30 NOTE — Progress Notes (Unsigned)
Location:   [place] Room Number: 809 Place of Service:  SNF [phone]) Provider:  Royal Hawthorn, NP   Virgie Dad, MD  Patient Care Team: Virgie Dad, MD as PCP - General (Internal Medicine) Addison Lank, MD as Consulting Physician (Dermatology)  Extended Emergency Contact Information Primary Emergency Contact: Vanzanten,[name] Address: [address] [place], [place] [address] [name] Phone: [phone] Relation: Son  Code Status:  DNR Goals of care: Advanced Directive information    05/30/2022   11:00 AM  Advanced Directives  Does Patient Have a Medical Advance Directive? Yes  Type of Advance Directive Out of facility DNR (pink MOST or yellow form);Healthcare Power of Attorney  Does patient want to make changes to medical advance directive? No - Patient declined  Copy of [place] in Chart? Yes - validated most recent copy scanned in chart (See row information)  Pre-existing out of facility DNR order (yellow form or pink MOST form) Yellow form placed in chart (order not valid for inpatient use)     Chief Complaint  Patient presents with   Acute Visit    Edema    HPI:  Pt is a 88 y.o. female seen today for an acute visit for    Past Medical History:  Diagnosis Date   Alzheimer's disease ([place])    High cholesterol    Uterine cancer ([place])    Past Surgical History:  Procedure Laterality Date   [facility]   cataract surgery Bilateral     Allergies  Allergen Reactions   Penicillins Hives   Sulfa Antibiotics Hives    Allergies as of 05/30/2022       Reactions   Penicillins Hives   Sulfa Antibiotics Hives        Medication List        Accurate as of May 30, 2022 11:02 AM. If you have any questions, ask your nurse or doctor.          STOP taking these medications    Tylenol 325 MG tablet Generic drug:  acetaminophen Stopped by: Royal Hawthorn, NP       TAKE these medications    ipratropium-albuterol 0.5-2.5 (3) MG/3ML Soln Commonly known as: DUONEB Take 3 mLs by nebulization 3 (three) times daily as needed.   melatonin 1 MG Tabs tablet Take by mouth at bedtime.   memantine 28 MG Cp24 24 hr capsule Commonly known as: NAMENDA XR Take 1 capsule (28 mg total) by mouth daily.   rivastigmine 3 MG capsule Commonly known as: Exelon Take 1 capsule (3 mg total) by mouth 2 (two) times daily.   simvastatin 20 MG tablet Commonly known as: ZOCOR TAKE 1 TABLET DAILY   Vitamin D3 50 MCG (2000 UT) Tabs Take 1 tablet by mouth daily.        Review of Systems  Immunization History  Administered Date(s) Administered   Influenza, High Dose Seasonal PF 10/07/2019   Influenza,inj,Quad PF,6+ Mos 10/22/2018   Influenza-Unspecified 10/31/2015, 10/23/2016, 10/19/2017, 10/23/2020, 10/02/2021   Moderna SARS-COV2 Booster Vaccination 10/09/2021, 04/24/2022   Moderna Sars-Covid-2 Vaccination 01/31/2020, 02/28/2020, 10/16/2020   Pneumococcal Conjugate-13 06/16/2018   Pneumococcal Polysaccharide-23 02/04/2012   Tdap 10/16/2018   Zoster Recombinat (Shingrix) 07/10/2017, 11/13/2017, 06/27/2018   Pertinent  Health Maintenance Due  Topic Date Due   INFLUENZA VACCINE  07/29/2022   DEXA SCAN  Completed  COLONOSCOPY (Pts 45-80yr Insurance coverage will need to be confirmed)  Discontinued      02/23/2019    1:34 PM 04/27/2019    9:25 AM 08/24/2019    9:34 AM 10/24/2019   11:00 AM 03/08/2021    3:47 PM  Fall Risk  Falls in the past year? 1 1 0 1 0  Was there an injury with Fall? 1 1 0 1 0  Fall Risk Category Calculator 2 2 0 2 0  Fall Risk Category Moderate Moderate Low Moderate Low  Patient Fall Risk Level Moderate fall risk Moderate fall risk Low fall risk  Low fall risk  Patient at Risk for Falls Due to History of fall(s);Impaired balance/gait;Impaired mobility;Mental status  change;Medication side effect      Fall risk Follow up Falls evaluation completed;Education provided;Falls prevention discussed      Fall risk Follow up - Comments PT referral       Functional Status Survey:    Vitals:   05/30/22 1051  BP: 132/78  Pulse: 72  Resp: 16  Temp: (!) 97.5 F (36.4 C)  SpO2: 100%  Weight: 143 lb 3.2 oz (65 kg)  Height: '5\' 1"'$  (1.549 m)   Body mass index is 27.06 kg/m. Physical Exam  Labs reviewed: Recent Labs    10/22/21 0000 10/24/21 0000 05/02/22 0000  NA 148* 144 146  K 4.8 3.8 4.3  CL 110* 106 107  CO2 18 32* 28*  BUN 23* 23* 20  CREATININE 0.9 0.9 0.8  CALCIUM 9.8 9.7 9.8   Recent Labs    05/02/22 0000  AST 18  ALT 12  ALKPHOS 98  ALBUMIN 0.4*   Recent Labs    10/17/21 0000 01/17/22 0000 05/02/22 0000  WBC 10.3 5.2 5.0  HGB 12.5 12.6 12.9  HCT 38 37 38  PLT 265 201 246   Lab Results  Component Value Date   TSH 3.40 05/02/2022   No results found for: HGBA1C Lab Results  Component Value Date   CHOL 152 01/07/2021   HDL 48 01/07/2021   LDLCALC 85 01/07/2021   TRIG 96 01/07/2021    Significant Diagnostic Results in last 30 days:  No results found.  Assessment/Plan There are no diagnoses linked to this encounter.   Family/ staff Communication:   Labs/tests ordered:

## 2022-06-03 DIAGNOSIS — E785 Hyperlipidemia, unspecified: Secondary | ICD-10-CM | POA: Diagnosis not present

## 2022-06-03 LAB — LIPID PANEL
Cholesterol: 139 (ref 0–200)
HDL: 49 (ref 35–70)
LDL Cholesterol: 76
Triglycerides: 69 (ref 40–160)

## 2022-06-19 ENCOUNTER — Encounter: Payer: Self-pay | Admitting: Adult Health

## 2022-06-19 ENCOUNTER — Non-Acute Institutional Stay: Payer: Medicare Other | Admitting: Adult Health

## 2022-06-19 DIAGNOSIS — R531 Weakness: Secondary | ICD-10-CM

## 2022-06-19 DIAGNOSIS — F0280 Dementia in other diseases classified elsewhere without behavioral disturbance: Secondary | ICD-10-CM

## 2022-06-19 DIAGNOSIS — G309 Alzheimer's disease, unspecified: Secondary | ICD-10-CM

## 2022-06-19 NOTE — Progress Notes (Addendum)
Location:  [facility] Room Number: 306/A Place of Service:  ALF [phone]) Provider: Fletcher Anon, NP  Patient Care Team: Mahlon Gammon, MD as PCP - General (Internal Medicine) Girard Cooter, MD as Consulting Physician (Dermatology)  Extended Emergency Contact Information Primary Emergency Contact: Karen Mclean Address: [address] [place], [place] [address] [place] Home Phone: [phone] Relation: Son  Code Status:  DNR Goals of care: Advanced Directive information    06/19/2022    9:54 AM  Advanced Directives  Does Patient Have a Medical Advance Directive? Yes  Type of Advance Directive Out of facility DNR (pink MOST or yellow form);Healthcare Power of Attorney  Does patient want to make changes to medical advance directive? No - Patient declined  Copy of Healthcare Power of Attorney in Chart? Yes - validated most recent copy scanned in chart (See row information)  Pre-existing out of facility DNR order (yellow form or pink MOST form) Yellow form placed in chart (order not valid for inpatient use)     Chief Complaint  Patient presents with   Acute Visit    Progressive weakness     HPI:  Pt is a 88 y.o. female seen today for an acute visit for  progressive weakness.  She has severe dementia and resides in the memory care as a long term resident. Recently she has stopped walking and now requires a WC and hoyer lift for transfers. Had several falls prior to this. She is still eating well and has been gaining weight. No choking or aspiration. Does have difficulty with pills sometimes. Smiles today and there are no acute complaints. Had routine labs, no acute findings.    Past Medical History:  Diagnosis Date   Alzheimer's disease ([facility])    High cholesterol    Uterine cancer ([facility])    Past Surgical History:  Procedure Laterality Date   ABDOMINAL HYSTERECTOMY  1997   [facility]   cataract surgery  Bilateral     Allergies  Allergen Reactions   Penicillins Hives   Sulfa Antibiotics Hives    Outpatient Encounter Medications as of 06/19/2022  Medication Sig   acetaminophen (TYLENOL) 325 MG tablet Take 650 mg by mouth as needed for mild pain or fever.   Cholecalciferol (VITAMIN D3) 2000 units TABS Take 1 tablet by mouth daily.   ipratropium-albuterol (DUONEB) 0.5-2.5 (3) MG/3ML SOLN Take 3 mLs by nebulization 3 (three) times daily as needed.   Melatonin 1 MG TABS Take by mouth at bedtime.   memantine (NAMENDA XR) 28 MG CP24 24 hr capsule Take 1 capsule (28 mg total) by mouth daily.   rivastigmine (EXELON) 3 MG capsule Take 1 capsule (3 mg total) by mouth 2 (two) times daily.   simvastatin (ZOCOR) 20 MG tablet TAKE 1 TABLET DAILY   No facility-administered encounter medications on file as of 06/19/2022.    Review of Systems  Immunization History  Administered Date(s) Administered   Influenza, High Dose Seasonal PF 10/07/2019   Influenza,inj,Quad PF,6+ Mos 10/22/2018   Influenza-Unspecified 10/31/2015, 10/23/2016, 10/19/2017, 10/23/2020, 10/02/2021   Moderna SARS-COV2 Booster Vaccination 10/09/2021, 04/24/2022   Moderna Sars-Covid-2 Vaccination 01/31/2020, 02/28/2020, 10/16/2020   Pneumococcal Conjugate-13 06/16/2018   Pneumococcal Polysaccharide-23 02/04/2012   Tdap 10/16/2018   Zoster Recombinat (Shingrix) 07/10/2017, 11/13/2017, 06/27/2018   Pertinent  Health Maintenance Due  Topic Date Due   INFLUENZA VACCINE  07/29/2022   DEXA SCAN  Completed  COLONOSCOPY (Pts 45-80yrs Insurance coverage will need to be confirmed)  Discontinued      02/23/2019    1:34 PM 04/27/2019    9:25 AM 08/24/2019    9:34 AM 10/24/2019   11:00 AM 03/08/2021    3:47 PM  Fall Risk  Falls in the past year? 1 1 0 1 0  Was there an injury with Fall? 1 1 0 1 0  Fall Risk Category Calculator 2 2 0 2 0  Fall Risk Category Moderate Moderate Low Moderate Low  Patient Fall Risk Level Moderate fall  risk Moderate fall risk Low fall risk  Low fall risk  Patient at Risk for Falls Due to History of fall(s);Impaired balance/gait;Impaired mobility;Mental status change;Medication side effect      Fall risk Follow up Falls evaluation completed;Education provided;Falls prevention discussed      Fall risk Follow up - Comments PT referral       Functional Status Survey:    Vitals:   06/19/22 0949  BP: 130/76  Pulse: 68  Resp: 16  Temp: 98.1 F (36.7 C)  SpO2: 96%  Weight: 143 lb 3.2 oz (65 kg)  Height: 5\' 1"  (1.549 m)   Body mass index is 27.06 kg/m. Physical Exam Vitals and nursing note reviewed.  Constitutional:      General: She is not in acute distress.    Appearance: She is not diaphoretic.  HENT:     Head: Normocephalic and atraumatic.  Neck:     Vascular: No JVD.  Cardiovascular:     Rate and Rhythm: Normal rate and regular rhythm.     Heart sounds: No murmur heard. Pulmonary:     Effort: Pulmonary effort is normal. No respiratory distress.     Breath sounds: Normal breath sounds. No wheezing.  Abdominal:     General: Abdomen is flat. Bowel sounds are normal. There is no distension.     Palpations: Abdomen is soft.     Tenderness: There is no abdominal tenderness.  Musculoskeletal:        General: No swelling, tenderness, deformity or signs of injury.     Comments: Trace edema bilat ankles.   Skin:    General: Skin is warm and dry.  Neurological:     General: No focal deficit present.     Mental Status: She is alert. Mental status is at baseline.  Psychiatric:        Mood and Affect: Mood normal.     Labs reviewed: Recent Labs    10/22/21 0000 10/24/21 0000 05/02/22 0000  NA 148* 144 146  K 4.8 3.8 4.3  CL 110* 106 107  CO2 18 32* 28*  BUN 23* 23* 20  CREATININE 0.9 0.9 0.8  CALCIUM 9.8 9.7 9.8   Recent Labs    05/02/22 0000  AST 18  ALT 12  ALKPHOS 98  ALBUMIN 0.4*   Recent Labs    10/17/21 0000 01/17/22 0000 05/02/22 0000  WBC 10.3  5.2 5.0  HGB 12.5 12.6 12.9  HCT 38 37 38  PLT 265 201 246   Lab Results  Component Value Date   TSH 3.40 05/02/2022   No results found for: "HGBA1C" Lab Results  Component Value Date   CHOL 139 06/03/2022   HDL 49 06/03/2022   LDLCALC 76 06/03/2022   TRIG 69 06/03/2022    Significant Diagnostic Results in last 30 days:  No results found.  Assessment/Plan  1. Weakness Due to progressive dementia.  All labs  and exam are without acute findings.   2. Alzheimer's disease ([facility]) Progressing towards severe stage Needs meeting to discuss most form, has DNR Consider at that time tapering meds such as zocor and memory meds.    Family/ staff Communication: left message with her husband Greggory Stallion to set up meeting to discuss most form.   Labs/tests ordered:  NA

## 2022-06-20 ENCOUNTER — Encounter: Payer: Self-pay | Admitting: Adult Health

## 2022-06-23 ENCOUNTER — Non-Acute Institutional Stay: Payer: Medicare Other | Admitting: Adult Health

## 2022-06-23 DIAGNOSIS — E782 Mixed hyperlipidemia: Secondary | ICD-10-CM | POA: Diagnosis not present

## 2022-06-23 DIAGNOSIS — F02818 Dementia in other diseases classified elsewhere, unspecified severity, with other behavioral disturbance: Secondary | ICD-10-CM

## 2022-06-23 DIAGNOSIS — Z7189 Other specified counseling: Secondary | ICD-10-CM

## 2022-06-23 DIAGNOSIS — G301 Alzheimer's disease with late onset: Secondary | ICD-10-CM

## 2022-06-24 ENCOUNTER — Encounter: Payer: Self-pay | Admitting: Adult Health

## 2022-06-24 DIAGNOSIS — R278 Other lack of coordination: Secondary | ICD-10-CM | POA: Diagnosis not present

## 2022-06-24 DIAGNOSIS — M6389 Disorders of muscle in diseases classified elsewhere, multiple sites: Secondary | ICD-10-CM | POA: Diagnosis not present

## 2022-06-24 DIAGNOSIS — M810 Age-related osteoporosis without current pathological fracture: Secondary | ICD-10-CM | POA: Diagnosis not present

## 2022-06-24 DIAGNOSIS — G308 Other Alzheimer's disease: Secondary | ICD-10-CM | POA: Diagnosis not present

## 2022-07-03 DIAGNOSIS — R278 Other lack of coordination: Secondary | ICD-10-CM | POA: Diagnosis not present

## 2022-07-03 DIAGNOSIS — G308 Other Alzheimer's disease: Secondary | ICD-10-CM | POA: Diagnosis not present

## 2022-07-03 DIAGNOSIS — M6389 Disorders of muscle in diseases classified elsewhere, multiple sites: Secondary | ICD-10-CM | POA: Diagnosis not present

## 2022-07-03 DIAGNOSIS — M810 Age-related osteoporosis without current pathological fracture: Secondary | ICD-10-CM | POA: Diagnosis not present

## 2022-07-04 DIAGNOSIS — M6389 Disorders of muscle in diseases classified elsewhere, multiple sites: Secondary | ICD-10-CM | POA: Diagnosis not present

## 2022-07-04 DIAGNOSIS — G308 Other Alzheimer's disease: Secondary | ICD-10-CM | POA: Diagnosis not present

## 2022-07-04 DIAGNOSIS — M810 Age-related osteoporosis without current pathological fracture: Secondary | ICD-10-CM | POA: Diagnosis not present

## 2022-07-04 DIAGNOSIS — R278 Other lack of coordination: Secondary | ICD-10-CM | POA: Diagnosis not present

## 2022-07-07 DIAGNOSIS — M810 Age-related osteoporosis without current pathological fracture: Secondary | ICD-10-CM | POA: Diagnosis not present

## 2022-07-07 DIAGNOSIS — R278 Other lack of coordination: Secondary | ICD-10-CM | POA: Diagnosis not present

## 2022-07-07 DIAGNOSIS — M6389 Disorders of muscle in diseases classified elsewhere, multiple sites: Secondary | ICD-10-CM | POA: Diagnosis not present

## 2022-07-07 DIAGNOSIS — G308 Other Alzheimer's disease: Secondary | ICD-10-CM | POA: Diagnosis not present

## 2022-07-09 DIAGNOSIS — G308 Other Alzheimer's disease: Secondary | ICD-10-CM | POA: Diagnosis not present

## 2022-07-09 DIAGNOSIS — M6389 Disorders of muscle in diseases classified elsewhere, multiple sites: Secondary | ICD-10-CM | POA: Diagnosis not present

## 2022-07-09 DIAGNOSIS — M810 Age-related osteoporosis without current pathological fracture: Secondary | ICD-10-CM | POA: Diagnosis not present

## 2022-07-09 DIAGNOSIS — R278 Other lack of coordination: Secondary | ICD-10-CM | POA: Diagnosis not present

## 2022-07-14 DIAGNOSIS — G308 Other Alzheimer's disease: Secondary | ICD-10-CM | POA: Diagnosis not present

## 2022-07-14 DIAGNOSIS — M810 Age-related osteoporosis without current pathological fracture: Secondary | ICD-10-CM | POA: Diagnosis not present

## 2022-07-14 DIAGNOSIS — R278 Other lack of coordination: Secondary | ICD-10-CM | POA: Diagnosis not present

## 2022-07-14 DIAGNOSIS — M6389 Disorders of muscle in diseases classified elsewhere, multiple sites: Secondary | ICD-10-CM | POA: Diagnosis not present

## 2022-08-29 ENCOUNTER — Encounter: Payer: Self-pay | Admitting: Adult Health

## 2022-08-29 ENCOUNTER — Non-Acute Institutional Stay: Payer: Medicare Other | Admitting: Adult Health

## 2022-08-29 DIAGNOSIS — F02818 Dementia in other diseases classified elsewhere, unspecified severity, with other behavioral disturbance: Secondary | ICD-10-CM

## 2022-08-29 DIAGNOSIS — M159 Polyosteoarthritis, unspecified: Secondary | ICD-10-CM | POA: Diagnosis not present

## 2022-08-29 DIAGNOSIS — M150 Primary generalized (osteo)arthritis: Secondary | ICD-10-CM

## 2022-08-29 DIAGNOSIS — F5101 Primary insomnia: Secondary | ICD-10-CM

## 2022-08-29 DIAGNOSIS — M816 Localized osteoporosis [Lequesne]: Secondary | ICD-10-CM | POA: Diagnosis not present

## 2022-08-29 DIAGNOSIS — G301 Alzheimer's disease with late onset: Secondary | ICD-10-CM

## 2022-08-29 DIAGNOSIS — E7800 Pure hypercholesterolemia, unspecified: Secondary | ICD-10-CM

## 2022-08-29 DIAGNOSIS — N3281 Overactive bladder: Secondary | ICD-10-CM

## 2022-08-29 NOTE — Progress Notes (Signed)
Location:  [facility] Room Number: memory- NO/306/A Place of Service:  ALF [phone]) Provider:  Royal Hawthorn, NP Virgie Dad, MD  Patient Care Team: Virgie Dad, MD as PCP - General (Internal Medicine) Addison Lank, MD as Consulting Physician (Dermatology)  Extended Emergency Contact Information Primary Emergency Contact: Haro,[name] Address: [address] [place], [place] [address] [name] of [place] Phone: [phone] Relation: Son  Code Status:  DNR Goals of care: Advanced Directive information    08/29/2022   11:12 AM  Advanced Directives  Does Patient Have a Medical Advance Directive? Yes  Type of Advance Directive Out of facility DNR (pink MOST or yellow form)  Does patient want to make changes to medical advance directive? No - Patient declined     Chief Complaint  Patient presents with   Medical Management of Chronic Issues    Patient is here for a follow up for chronic conditions, patient is also due for updated covid vaccine and flu vaccine    HPI:  Pt is a 88 y.o. female seen today for medical management of chronic diseases.  She resides in the memory care unit at [facility] due to advancing Alzheimer's dementia. She is no longer ambulatory and requires a hoyer lift for transfers. She went through a period of weight gain due to sedentary life style and increased intake but now is stabilizing.  She has a DNR and  most form. Some meds have been discontinued due to her goals of care but her husband wanted to leave the zocor on the list. Discussed with nurse. There are no behavior concerns. No issues chewing or swallowing.   Wt Readings from Last 3 Encounters:  08/29/22 141 lb (64 kg)  06/19/22 143 lb 3.2 oz (65 kg)  05/30/22 143 lb 3.2 oz (65 kg)    Bps reviewed Blood Pressure: 132 / 72 mmHg  Blood Pressure: 136 / 76 mmHg  Blood Pressure: 130 / 80 mmHg  Blood Pressure: 126 / 70 mmHg  Past  Medical History:  Diagnosis Date   Alzheimer's disease ([place])    High cholesterol    Uterine cancer ([place])    Past Surgical History:  Procedure Laterality Date   [facility]   cataract surgery Bilateral     Allergies  Allergen Reactions   Penicillins Hives   Sulfa Antibiotics Hives    Outpatient Encounter Medications as of 08/29/2022  Medication Sig   acetaminophen (TYLENOL) 325 MG tablet Take 650 mg by mouth as needed for mild pain or fever.   Cholecalciferol (VITAMIN D3) 2000 units TABS Take 1 tablet by mouth daily.   ipratropium-albuterol (DUONEB) 0.5-2.5 (3) MG/3ML SOLN Take 3 mLs by nebulization 3 (three) times daily as needed.   Melatonin 1 MG TABS Take by mouth at bedtime.   memantine (NAMENDA XR) 28 MG CP24 24 hr capsule Take 1 capsule (28 mg total) by mouth daily.   simvastatin (ZOCOR) 20 MG tablet TAKE 1 TABLET DAILY   No facility-administered encounter medications on file as of 08/29/2022.    Review of Systems  Unable to perform ROS: Dementia    Immunization History  Administered Date(s) Administered   Influenza, High Dose Seasonal PF 10/07/2019   Influenza,inj,Quad PF,6+ Mos 10/22/2018   Influenza-Unspecified 10/31/2015, 10/23/2016, 10/19/2017, 10/23/2020, 10/02/2021   Moderna SARS-COV2 Booster Vaccination 10/09/2021, 04/24/2022   Moderna Sars-Covid-2 Vaccination 01/31/2020, 02/28/2020, 10/16/2020  Pneumococcal Conjugate-13 06/16/2018   Pneumococcal Polysaccharide-23 02/04/2012   Tdap 10/16/2018   Zoster Recombinat (Shingrix) 07/10/2017, 11/13/2017, 06/27/2018   Pertinent  Health Maintenance Due  Topic Date Due   INFLUENZA VACCINE  07/29/2022   DEXA SCAN  Completed   COLONOSCOPY (Pts 45-65yr Insurance coverage will need to be confirmed)  Discontinued      04/27/2019    9:25 AM 08/24/2019    9:34 AM 10/24/2019   11:00 AM 03/08/2021    3:47 PM 08/29/2022   11:12 AM  Fall Risk  Falls in the past year? 1 0 1 0 0  Was there  an injury with Fall? 1 0 1 0 0  Fall Risk Category Calculator 2 0 2 0 0  Fall Risk Category Moderate Low Moderate Low Low  Patient Fall Risk Level Moderate fall risk Low fall risk  Low fall risk Low fall risk  Patient at Risk for Falls Due to     No Fall Risks  Fall risk Follow up     Falls evaluation completed   Functional Status Survey:    Vitals:   08/29/22 1111  BP: 132/72  Pulse: 72  Resp: 16  SpO2: 96%  Weight: 141 lb (64 kg)  Height: '5\' 1"'$  (1.549 m)   Body mass index is 26.64 kg/m. Physical Exam Vitals and nursing note reviewed.  Constitutional:      General: She is not in acute distress.    Appearance: She is not diaphoretic.  HENT:     Head: Normocephalic and atraumatic.     Nose: Nose normal.  Eyes:     Conjunctiva/sclera: Conjunctivae normal.     Pupils: Pupils are equal, round, and reactive to light.  Neck:     Vascular: No JVD.  Cardiovascular:     Rate and Rhythm: Normal rate and regular rhythm.     Heart sounds: No murmur heard. Pulmonary:     Effort: Pulmonary effort is normal. No respiratory distress.     Breath sounds: Normal breath sounds. No wheezing.  Abdominal:     General: Abdomen is flat. Bowel sounds are normal. There is no distension.     Palpations: Abdomen is soft.     Tenderness: There is no abdominal tenderness.  Skin:    General: Skin is warm and dry.  Neurological:     General: No focal deficit present.     Mental Status: She is alert. Mental status is at baseline.  Psychiatric:        Mood and Affect: Mood normal.     Labs reviewed: Recent Labs    10/22/21 0000 10/24/21 0000 05/02/22 0000  NA 148* 144 146  K 4.8 3.8 4.3  CL 110* 106 107  CO2 18 32* 28*  BUN 23* 23* 20  CREATININE 0.9 0.9 0.8  CALCIUM 9.8 9.7 9.8   Recent Labs    05/02/22 0000  AST 18  ALT 12  ALKPHOS 98  ALBUMIN 0.4*   Recent Labs    10/17/21 0000 01/17/22 0000 05/02/22 0000  WBC 10.3 5.2 5.0  HGB 12.5 12.6 12.9  HCT 38 37 38  PLT 265  201 246   Lab Results  Component Value Date   TSH 3.40 05/02/2022   No results found for: "HGBA1C" Lab Results  Component Value Date   CHOL 139 06/03/2022   HDL 49 06/03/2022   LDLCALC 76 06/03/2022   TRIG 69 06/03/2022    Significant Diagnostic Results in last 30 days:  No results found.  Assessment/Plan  1. Dementia of the Alzheimer's type, with late onset, with delusions ([place]) Progressive decline in cognition and physical function c/w the disease. Continue supportive care in the skilled environment.  2. Primary osteoarthritis involving multiple joints Continue tylenol prn No new issues.   3. Localized osteoporosis without current pathological fracture Would not treat at this point as she is no longer weight bearing.   4. OAB (overactive bladder) Has incontinence, no other issues.  Not current on meds.   5. Primary insomnia Continue melatonin   6. Pure hypercholesterolemia Continue zocor, will considering discontinuing in the near future, husband wanted to leave on board for now.   Family/ staff Communication: nurse  Total time 33mn:  time greater than 50% of total time spent doing pt counseling and coordination of care     Labs/tests ordered:   NA

## 2022-11-03 ENCOUNTER — Non-Acute Institutional Stay (SKILLED_NURSING_FACILITY): Payer: Medicare Other | Admitting: Internal Medicine

## 2022-11-03 ENCOUNTER — Encounter: Payer: Self-pay | Admitting: Internal Medicine

## 2022-11-03 DIAGNOSIS — M816 Localized osteoporosis [Lequesne]: Secondary | ICD-10-CM | POA: Diagnosis not present

## 2022-11-03 DIAGNOSIS — E7800 Pure hypercholesterolemia, unspecified: Secondary | ICD-10-CM

## 2022-11-03 DIAGNOSIS — F02818 Dementia in other diseases classified elsewhere, unspecified severity, with other behavioral disturbance: Secondary | ICD-10-CM | POA: Diagnosis not present

## 2022-11-03 DIAGNOSIS — F5101 Primary insomnia: Secondary | ICD-10-CM | POA: Diagnosis not present

## 2022-11-03 DIAGNOSIS — G301 Alzheimer's disease with late onset: Secondary | ICD-10-CM | POA: Diagnosis not present

## 2022-11-03 NOTE — Progress Notes (Signed)
Location:  [facility] Room Number: 141A Place of Service:  SNF [phone]) Provider:  Dr. Clydene Fake, MD  Patient Care Team: Virgie Dad, MD as PCP - General (Internal Medicine) Addison Lank, MD as Consulting Physician (Dermatology)  Extended Emergency Contact Information Primary Emergency Contact: Clingenpeel,[name] Address: [address] [place], [place] [address] [name] of [place] Phone: [phone] Relation: Son  Code Status:  DNR Goals of care: Advanced Directive information    08/29/2022   11:12 AM  Advanced Directives  Does Patient Have a Medical Advance Directive? Yes  Type of Advance Directive Out of facility DNR (pink MOST or yellow form)  Does patient want to make changes to medical advance directive? No - Patient declined     Chief Complaint  Patient presents with   Medical Management of Chronic Issues    Medical Management of Chronic Issues.     HPI:  Pt is a 88 y.o. female seen today for medical management of chronic diseases.    Lives in SNF in [place]  Patient has h/o Alzheimer's Disease and HLD  Has become Non Ambulatory Needs Hoyer lift for transfers. Was taken off [employer]  No behaviors issues No Nursing issues Wt Readings from Last 3 Encounters:  11/03/22 140 lb 12.8 oz (63.9 kg)  08/29/22 141 lb (64 kg)  06/19/22 143 lb 3.2 oz (65 kg)     Just smiles Does not respond much  Past Medical History:  Diagnosis Date   Alzheimer's disease ([place])    High cholesterol    Uterine cancer ([place])    Past Surgical History:  Procedure Laterality Date   [facility]   cataract surgery Bilateral     Allergies  Allergen Reactions   Penicillins Hives   Sulfa Antibiotics Hives    Outpatient Encounter Medications as of 11/03/2022  Medication Sig   acetaminophen (TYLENOL) 325 MG tablet Take 650 mg by mouth as needed for mild pain or fever.    Cholecalciferol (VITAMIN D3) 2000 units TABS Take 1 tablet by mouth daily.   ipratropium-albuterol (DUONEB) 0.5-2.5 (3) MG/3ML SOLN Take 3 mLs by nebulization 3 (three) times daily as needed.   Melatonin 1 MG TABS Take by mouth at bedtime.   memantine (NAMENDA XR) 28 MG CP24 24 hr capsule Take 1 capsule (28 mg total) by mouth daily.   simvastatin (ZOCOR) 20 MG tablet TAKE 1 TABLET DAILY   No facility-administered encounter medications on file as of 11/03/2022.    Review of Systems  Unable to perform ROS: Dementia    Immunization History  Administered Date(s) Administered   Influenza, High Dose Seasonal PF 10/07/2019   Influenza,inj,Quad PF,6+ Mos 10/22/2018   Influenza-Unspecified 10/31/2015, 10/23/2016, 10/19/2017, 10/23/2020, 10/02/2021, 10/03/2022   Moderna SARS-COV2 Booster Vaccination 10/09/2021, 04/24/2022   Moderna Sars-Covid-2 Vaccination 01/31/2020, 02/28/2020, 10/16/2020   Pneumococcal Conjugate-13 06/16/2018   Pneumococcal Polysaccharide-23 02/04/2012   Tdap 10/16/2018   Zoster Recombinat (Shingrix) 07/10/2017, 11/13/2017, 06/27/2018   Pertinent  Health Maintenance Due  Topic Date Due   INFLUENZA VACCINE  Completed   DEXA SCAN  Completed   COLONOSCOPY (Pts 45-45yr Insurance coverage will need to be confirmed)  Discontinued      04/27/2019    9:25 AM 08/24/2019    9:34 AM 10/24/2019   11:00 AM 03/08/2021    3:47 PM 08/29/2022   11:12 AM  Fall  Risk  Falls in the past year? 1 0 1 0 0  Was there an injury with Fall? 1 0 1 0 0  Fall Risk Category Calculator 2 0 2 0 0  Fall Risk Category Moderate Low Moderate Low Low  Patient Fall Risk Level Moderate fall risk Low fall risk  Low fall risk Low fall risk  Patient at Risk for Falls Due to     No Fall Risks  Fall risk Follow up     Falls evaluation completed   Functional Status Survey:    Vitals:   11/03/22 1129  BP: (!) 110/56  Pulse: 72  Resp: 16  Temp: (!) 97.2 F (36.2 C)  SpO2: 94%  Weight: 140 lb 12.8 oz  (63.9 kg)  Height: '5\' 1"'$  (1.549 m)   Body mass index is 26.6 kg/m. Physical Exam Vitals reviewed.  Constitutional:      Appearance: Normal appearance.  HENT:     Head: Normocephalic.     Nose: Nose normal.     Mouth/Throat:     Mouth: Mucous membranes are moist.     Pharynx: Oropharynx is clear.  Eyes:     Pupils: Pupils are equal, round, and reactive to light.  Cardiovascular:     Rate and Rhythm: Normal rate and regular rhythm.     Pulses: Normal pulses.     Heart sounds: Normal heart sounds. No murmur heard. Pulmonary:     Effort: Pulmonary effort is normal.     Breath sounds: Normal breath sounds.  Abdominal:     General: Abdomen is flat. Bowel sounds are normal.     Palpations: Abdomen is soft.  Musculoskeletal:        General: No swelling.     Cervical back: Neck supple.  Skin:    General: Skin is warm.  Neurological:     General: No focal deficit present.     Mental Status: She is alert.  Psychiatric:        Mood and Affect: Mood normal.        Thought Content: Thought content normal.     Labs reviewed: Recent Labs    05/02/22 0000  NA 146  K 4.3  CL 107  CO2 28*  BUN 20  CREATININE 0.8  CALCIUM 9.8   Recent Labs    05/02/22 0000  AST 18  ALT 12  ALKPHOS 98  ALBUMIN 0.4*   Recent Labs    01/17/22 0000 05/02/22 0000  WBC 5.2 5.0  HGB 12.6 12.9  HCT 37 38  PLT 201 246   Lab Results  Component Value Date   TSH 3.40 05/02/2022   No results found for: "HGBA1C" Lab Results  Component Value Date   CHOL 139 06/03/2022   HDL 49 06/03/2022   LDLCALC 76 06/03/2022   TRIG 69 06/03/2022    Significant Diagnostic Results in last 30 days:  No results found.  Assessment/Plan  1. Dementia of the Alzheimer's type, with late onset, with delusions ([place]) On [place] MOST form No Hospitalization  2. Pure hypercholesterolemia Continue Zocor LDL 76 in 5/23  3. Primary insomnia Melatonin  4. Localized osteoporosis without current  pathological fracture BMD 08/22/21 T score -2.8 Decided not to pursue treatment due to her swallowing issues and Goals of care       Family/ staff Communication:   Labs/tests ordered:

## 2022-11-13 ENCOUNTER — Encounter: Payer: Self-pay | Admitting: Adult Health

## 2022-11-13 ENCOUNTER — Non-Acute Institutional Stay (SKILLED_NURSING_FACILITY): Payer: Medicare Other | Admitting: Adult Health

## 2022-11-13 DIAGNOSIS — H1033 Unspecified acute conjunctivitis, bilateral: Secondary | ICD-10-CM | POA: Diagnosis not present

## 2022-11-13 MED ORDER — ERYTHROMYCIN 5 MG/GM OP OINT: 1.0000 | TOPICAL_OINTMENT | Freq: Four times a day (QID) | OPHTHALMIC | 0 refills | Status: AC

## 2022-11-13 NOTE — Progress Notes (Signed)
Location:  [facility] of Service:  SNF (31) Provider:   Cindi Carbon, [place] [phone]   Virgie Dad, MD  Patient Care Team: Virgie Dad, MD as PCP - General (Internal Medicine) Addison Lank, MD as Consulting Physician (Dermatology)  Extended Emergency Contact Information Primary Emergency Contact: Hentges,[name] Address: [address] [place], [place] [address] [name] of [place] Phone: [phone] Relation: Son  Code Status:  DNR Goals of care: Advanced Directive information    08/29/2022   11:12 AM  Advanced Directives  Does Patient Have a Medical Advance Directive? Yes  Type of Advance Directive Out of facility DNR (pink MOST or yellow form)  Does patient want to make changes to medical advance directive? No - Patient declined     Chief Complaint  Patient presents with   Acute Visit    Eye drainage    HPI:  Pt is a 88 y.o. female seen today for an acute visit for eye drainage.  Resident has severe dementia and resides in skilled care. Nurse wrote an SBAR reporting eye redness, swelling, and drainage of the right eye. Pt is minimally verbal and does not appear to be in pain. No fever. No other associated symptoms. Sat is 90% with no cough or sob.    Past Medical History:  Diagnosis Date   Alzheimer's disease ([place])    High cholesterol    Uterine cancer [facility])    Past Surgical History:  Procedure Laterality Date   [facility]   cataract surgery Bilateral     Allergies  Allergen Reactions   Penicillins Hives   Sulfa Antibiotics Hives    Outpatient Encounter Medications as of 11/13/2022  Medication Sig   erythromycin ophthalmic ointment Place 1 Application into both eyes 4 (four) times daily for 7 days.   acetaminophen (TYLENOL) 325 MG tablet Take 650 mg by mouth as needed for mild pain or fever.   Cholecalciferol (VITAMIN D3) 2000  units TABS Take 1 tablet by mouth daily.   ipratropium-albuterol (DUONEB) 0.5-2.5 (3) MG/3ML SOLN Take 3 mLs by nebulization 3 (three) times daily as needed.   Melatonin 1 MG TABS Take by mouth at bedtime.   memantine (NAMENDA XR) 28 MG CP24 24 hr capsule Take 1 capsule (28 mg total) by mouth daily.   simvastatin (ZOCOR) 20 MG tablet TAKE 1 TABLET DAILY   No facility-administered encounter medications on file as of 11/13/2022.    Review of Systems  Unable to perform ROS: Dementia    Immunization History  Administered Date(s) Administered   Influenza, High Dose Seasonal PF 10/07/2019   Influenza,inj,Quad PF,6+ Mos 10/22/2018   Influenza-Unspecified 10/31/2015, 10/23/2016, 10/19/2017, 10/23/2020, 10/02/2021, 10/03/2022   Moderna SARS-COV2 Booster Vaccination 10/09/2021, 04/24/2022   Moderna Sars-Covid-2 Vaccination 01/31/2020, 02/28/2020, 10/16/2020   Pneumococcal Conjugate-13 06/16/2018   Pneumococcal Polysaccharide-23 02/04/2012   Tdap 10/16/2018   Zoster Recombinat (Shingrix) 07/10/2017, 11/13/2017, 06/27/2018   Pertinent  Health Maintenance Due  Topic Date Due   INFLUENZA VACCINE  Completed   DEXA SCAN  Completed   COLONOSCOPY (Pts 45-58yr Insurance coverage will need to be confirmed)  Discontinued      04/27/2019    9:25 AM 08/24/2019    9:34 AM 10/24/2019   11:00 AM 03/08/2021    3:47 PM 08/29/2022   11:12 AM  Fall Risk  Falls in the past year?  1 0 1 0 0  Was there an injury with Fall? 1 0 1 0 0  Fall Risk Category Calculator 2 0 2 0 0  Fall Risk Category Moderate Low Moderate Low Low  Patient Fall Risk Level Moderate fall risk Low fall risk  Low fall risk Low fall risk  Patient at Risk for Falls Due to     No Fall Risks  Fall risk Follow up     Falls evaluation completed   Functional Status Survey:    Vitals:   11/13/22 1145  BP: 131/83  Pulse: 71  Resp: 18  Temp: (!) 97.2 F (36.2 C)  SpO2: 90%   There is no height or weight on file to calculate  BMI. Physical Exam Vitals and nursing note reviewed.  Constitutional:      General: She is not in acute distress.    Appearance: She is not diaphoretic.  HENT:     Head: Normocephalic and atraumatic.     Nose: Nose normal.     Mouth/Throat:     Mouth: Mucous membranes are moist.     Pharynx: Oropharynx is clear.  Eyes:     General:        Right eye: Discharge present.        Left eye: No discharge.     Pupils: Pupils are equal, round, and reactive to light.     Comments: Right eye with mild upper eye lid edema. Mild right conjunctiva erythema and sclera erythema. Purulent matter noted. Left eye WNL  Neck:     Vascular: No JVD.  Cardiovascular:     Rate and Rhythm: Normal rate and regular rhythm.     Heart sounds: No murmur heard. Pulmonary:     Effort: Pulmonary effort is normal. No respiratory distress.     Breath sounds: No wheezing.     Comments: Decreased bases Skin:    General: Skin is warm and dry.  Neurological:     Mental Status: She is alert. Mental status is at baseline.     Labs reviewed: Recent Labs    05/02/22 0000  NA 146  K 4.3  CL 107  CO2 28*  BUN 20  CREATININE 0.8  CALCIUM 9.8   Recent Labs    05/02/22 0000  AST 18  ALT 12  ALKPHOS 98  ALBUMIN 0.4*   Recent Labs    01/17/22 0000 05/02/22 0000  WBC 5.2 5.0  HGB 12.6 12.9  HCT 37 38  PLT 201 246   Lab Results  Component Value Date   TSH 3.40 05/02/2022   No results found for: "HGBA1C" Lab Results  Component Value Date   CHOL 139 06/03/2022   HDL 49 06/03/2022   LDLCALC 76 06/03/2022   TRIG 69 06/03/2022    Significant Diagnostic Results in last 30 days:  No results found.  Assessment/Plan 1. Acute bacterial conjunctivitis of both eyes  - erythromycin ophthalmic ointment; Place 1 Application into both eyes 4 (four) times daily for 7 days.  Dispense: 28 g; Refill: 0

## 2022-12-04 ENCOUNTER — Non-Acute Institutional Stay (SKILLED_NURSING_FACILITY): Payer: Medicare Other | Admitting: Adult Health

## 2022-12-04 ENCOUNTER — Encounter: Payer: Self-pay | Admitting: Adult Health

## 2022-12-04 DIAGNOSIS — E7800 Pure hypercholesterolemia, unspecified: Secondary | ICD-10-CM

## 2022-12-04 DIAGNOSIS — R2681 Unsteadiness on feet: Secondary | ICD-10-CM

## 2022-12-04 DIAGNOSIS — M816 Localized osteoporosis [Lequesne]: Secondary | ICD-10-CM | POA: Diagnosis not present

## 2022-12-04 DIAGNOSIS — K5901 Slow transit constipation: Secondary | ICD-10-CM

## 2022-12-04 DIAGNOSIS — F5101 Primary insomnia: Secondary | ICD-10-CM

## 2022-12-04 DIAGNOSIS — G309 Alzheimer's disease, unspecified: Secondary | ICD-10-CM | POA: Diagnosis not present

## 2022-12-04 DIAGNOSIS — F0280 Dementia in other diseases classified elsewhere without behavioral disturbance: Secondary | ICD-10-CM

## 2022-12-04 NOTE — Progress Notes (Signed)
Location:  [facility] Room Number: 141-A Place of Service:  SNF [phone]) Provider:  Earney Hamburg, MD  Patient Care Team: Virgie Dad, MD as PCP - General (Internal Medicine) Addison Lank, MD as Consulting Physician (Dermatology)  Extended Emergency Contact Information Primary Emergency Contact: Downen,[name] Address: [address] [place], [place] [address] [name] of [place] Phone: [phone] Relation: Son  Code Status:  DNR Goals of care: Advanced Directive information    12/04/2022    3:09 PM  Advanced Directives  Does Patient Have a Medical Advance Directive? Yes  Type of Advance Directive Living will;Out of facility DNR (pink MOST or yellow form)  Does patient want to make changes to medical advance directive? No - Patient declined  Pre-existing out of facility DNR order (yellow form or pink MOST form) Yellow form placed in chart (order not valid for inpatient use)     Chief Complaint  Patient presents with   Routine    HPI:  Pt is a 88 y.o. female seen today for medical management of chronic diseases. She moved to skilled care due to progression of Alzheimer's dementia. She is now non ambulatory, needs a hoyer lift for transfers, has incontinence, and needs assistance with all ADLs. There are no reports of behaviors. Seems to have transitioned to skilled care from memory care well. She has a DNR and most form indicating comfort care. She is verbal but has difficulty following commands and answer questions. Not able to complete an MMSE.  She went through a period of weight gain but has lost 3 lbs in the past month.  Wt Readings from Last 3 Encounters:  12/04/22 137 lb 12.8 oz (62.5 kg)  11/03/22 140 lb 12.8 oz (63.9 kg)  08/29/22 141 lb (64 kg)    Currently on an a regular diet tolerating  Remains on zocor for HLD, husband wanted to continue this at our last meeting.     Past  Medical History:  Diagnosis Date   Alzheimer's disease ([place])    High cholesterol    Uterine cancer ([place])    Past Surgical History:  Procedure Laterality Date   [facility]   cataract surgery Bilateral     Allergies  Allergen Reactions   Penicillins Hives   Sulfa Antibiotics Hives    Outpatient Encounter Medications as of 12/04/2022  Medication Sig   acetaminophen (TYLENOL) 325 MG tablet Take 650 mg by mouth as needed for mild pain or fever.   Cholecalciferol (VITAMIN D3) 2000 units TABS Take 1 tablet by mouth daily.   ipratropium-albuterol (DUONEB) 0.5-2.5 (3) MG/3ML SOLN Take 3 mLs by nebulization 3 (three) times daily as needed.   magnesium hydroxide (MILK OF MAGNESIA) 400 MG/5ML suspension 45cc po daily now and then daily PRN x 2 days (EXCEPT in renal failure/HD pts).   Melatonin 1 MG TABS Take by mouth at bedtime.   memantine (NAMENDA XR) 28 MG CP24 24 hr capsule Take 1 capsule (28 mg total) by mouth daily.   simvastatin (ZOCOR) 20 MG tablet TAKE 1 TABLET DAILY   No facility-administered encounter medications on file as of 12/04/2022.    Review of Systems  Unable to perform ROS: Dementia    Immunization History  Administered Date(s) Administered   Fluad Quad(high Dose 65+) 10/03/2022   Influenza, High Dose Seasonal PF 10/07/2019   Influenza,inj,Quad PF,6+ Mos 10/22/2018  Influenza-Unspecified 10/31/2015, 10/23/2016, 10/19/2017, 10/23/2020, 10/02/2021, 10/03/2022   Moderna SARS-COV2 Booster Vaccination 10/09/2021, 04/24/2022, 11/03/2022   Moderna Sars-Covid-2 Vaccination 01/31/2020, 02/28/2020, 10/16/2020   Pneumococcal Conjugate-13 06/16/2018   Pneumococcal Polysaccharide-23 02/04/2012   Tdap 10/16/2018   Zoster Recombinat (Shingrix) 07/10/2017, 11/13/2017, 06/27/2018   Pertinent  Health Maintenance Due  Topic Date Due   INFLUENZA VACCINE  Completed   DEXA SCAN  Completed   COLONOSCOPY (Pts 45-44yr Insurance coverage will need  to be confirmed)  Discontinued      04/27/2019    9:25 AM 08/24/2019    9:34 AM 10/24/2019   11:00 AM 03/08/2021    3:47 PM 08/29/2022   11:12 AM  Fall Risk  Falls in the past year? 1 0 1 0 0  Was there an injury with Fall? 1 0 1 0 0  Fall Risk Category Calculator 2 0 2 0 0  Fall Risk Category Moderate Low Moderate Low Low  Patient Fall Risk Level Moderate fall risk Low fall risk  Low fall risk Low fall risk  Patient at Risk for Falls Due to     No Fall Risks  Fall risk Follow up     Falls evaluation completed   Functional Status Survey:    Vitals:   12/04/22 1459  BP: 131/70  Pulse: (!) 55  Resp: 16  Temp: (!) 97.2 F (36.2 C)  SpO2: 90%  Weight: 137 lb 12.8 oz (62.5 kg)  Height: '5\' 1"'$  (1.549 m)   Body mass index is 26.04 kg/m. Physical Exam Vitals and nursing note reviewed.  Constitutional:      General: She is not in acute distress.    Appearance: She is not diaphoretic.  HENT:     Head: Normocephalic and atraumatic.  Eyes:     Conjunctiva/sclera: Conjunctivae normal.     Pupils: Pupils are equal, round, and reactive to light.  Neck:     Vascular: No JVD.  Cardiovascular:     Rate and Rhythm: Normal rate and regular rhythm.     Heart sounds: No murmur heard. Pulmonary:     Effort: Pulmonary effort is normal. No respiratory distress.     Breath sounds: Normal breath sounds. No wheezing.  Skin:    General: Skin is warm and dry.  Neurological:     General: No focal deficit present.     Mental Status: She is alert. Mental status is at baseline.  Psychiatric:        Mood and Affect: Mood normal.     Labs reviewed: Recent Labs    05/02/22 0000  NA 146  K 4.3  CL 107  CO2 28*  BUN 20  CREATININE 0.8  CALCIUM 9.8   Recent Labs    05/02/22 0000  AST 18  ALT 12  ALKPHOS 98  ALBUMIN 0.4*   Recent Labs    01/17/22 0000 05/02/22 0000  WBC 5.2 5.0  HGB 12.6 12.9  HCT 37 38  PLT 201 246   Lab Results  Component Value Date   TSH 3.40  05/02/2022   No results found for: "HGBA1C" Lab Results  Component Value Date   CHOL 139 06/03/2022   HDL 49 06/03/2022   LDLCALC 76 06/03/2022   TRIG 69 06/03/2022    Significant Diagnostic Results in last 30 days:  No results found.  Assessment/Plan  1. Alzheimer's disease ([place] Progressive decline in cognition and physical function c/w the disease. Continue supportive care in the skilled environment. Continue Namenda.  2. Localized osteoporosis without current pathological fracture T score -2.8 08/22/21 ON Vit D Due to non weight bearing status would not likely benefit from OP meds.   3. Pure hypercholesterolemia Lab Results  Component Value Date   LDLCALC 76 06/03/2022   Continue zocor  4. Primary insomnia Continue melatonin  5. Slow transit constipation Continue prn MOM or dulcolax.   6. Gait instability NO longer ambulatory due to goals of care In supportive chair    Labs/tests ordered:  NA

## 2023-01-01 ENCOUNTER — Non-Acute Institutional Stay (SKILLED_NURSING_FACILITY): Payer: Medicare Other | Admitting: Adult Health

## 2023-01-01 ENCOUNTER — Encounter: Payer: Self-pay | Admitting: Adult Health

## 2023-01-01 DIAGNOSIS — M816 Localized osteoporosis [Lequesne]: Secondary | ICD-10-CM

## 2023-01-01 DIAGNOSIS — G301 Alzheimer's disease with late onset: Secondary | ICD-10-CM | POA: Diagnosis not present

## 2023-01-01 DIAGNOSIS — F5101 Primary insomnia: Secondary | ICD-10-CM | POA: Diagnosis not present

## 2023-01-01 DIAGNOSIS — K5901 Slow transit constipation: Secondary | ICD-10-CM

## 2023-01-01 DIAGNOSIS — F02818 Dementia in other diseases classified elsewhere, unspecified severity, with other behavioral disturbance: Secondary | ICD-10-CM

## 2023-01-01 DIAGNOSIS — E7800 Pure hypercholesterolemia, unspecified: Secondary | ICD-10-CM | POA: Diagnosis not present

## 2023-01-01 NOTE — Progress Notes (Signed)
Location:  [facility] Room Number: 141A Place of Service:  SNF [phone]) Provider:  Royal Hawthorn ,NP  Virgie Dad, MD  Patient Care Team: Virgie Dad, MD as PCP - General (Internal Medicine) Addison Lank, MD as Consulting Physician (Dermatology)  Extended Emergency Contact Information Primary Emergency Contact: Nies,[name] Address: [address] [place], [place] [address] [name] of [place] Phone: [phone] Relation: Son  Code Status:  DNR Goals of care: Advanced Directive information    01/01/2023    3:58 PM  Advanced Directives  Does Patient Have a Medical Advance Directive? Yes  Type of Advance Directive Living will;Out of facility DNR (pink MOST or yellow form);[place] in Chart? Yes - validated most recent copy scanned in chart (See row information)  Pre-existing out of facility DNR order (yellow form or pink MOST form) Yellow form placed in chart (order not valid for inpatient use)     Chief Complaint  Patient presents with   Medical Management of Chronic Issues    Routine visit    HPI:  Pt is a 89 y.o. female seen today for medical management of chronic diseases.    She resides in skilled care due to progression of Alzheimer's dementia. She is non ambulatory, needs a hoyer lift for transfers, has incontinence, and needs assistance with all ADLs. There are no reports of behaviors. She has a DNR and most form indicating comfort care. She is verbal but has difficulty following commands and answer questions. Not able to complete an MMSE. She has lost 8 lbs in the past couple of months. However, prior to this she went through a period of weight gain once she became more sedentary.  Wt Readings from Last 3 Encounters:  01/01/23 132 lb 12.8 oz (60.2 kg)  12/04/22 137 lb 12.8 oz (62.5 kg)  11/03/22 140 lb 12.8 oz (63.9 kg)   Bps reviewed in  matrix Blood Pressure: 146 / 71 mmHg  Blood Pressure: 112 / 75 mmHg   Blood Pressure: 111 / 58 mmHg  Blood Pressure: 140 / 84 mmHg  Reviewed nursing notes, going 3 days without BM Past Medical History:  Diagnosis Date   Alzheimer's disease ([place])    High cholesterol    Uterine cancer ([place])    Past Surgical History:  Procedure Laterality Date   [facility]   cataract surgery Bilateral     Allergies  Allergen Reactions   Penicillins Hives   Sulfa Antibiotics Hives    Outpatient Encounter Medications as of 01/01/2023  Medication Sig   acetaminophen (TYLENOL) 325 MG tablet Take 650 mg by mouth as needed for mild pain or fever.   Cholecalciferol (VITAMIN D3) 2000 units TABS Take 1 tablet by mouth daily.   ipratropium-albuterol (DUONEB) 0.5-2.5 (3) MG/3ML SOLN Take 3 mLs by nebulization 3 (three) times daily as needed.   magnesium hydroxide (MILK OF MAGNESIA) 400 MG/5ML suspension 45cc po daily now and then daily PRN x 2 days (EXCEPT in renal failure/HD pts).   Melatonin 1 MG TABS Take by mouth at bedtime.   memantine (NAMENDA XR) 28 MG CP24 24 hr capsule Take 1 capsule (28 mg total) by mouth daily.   simvastatin (ZOCOR) 20 MG tablet TAKE 1 TABLET DAILY   No facility-administered encounter medications on file as of 01/01/2023.    Review of Systems  Unable to perform ROS: Dementia    Immunization History  Administered Date(s) Administered   Fluad Quad(high Dose 65+) 10/03/2022   Influenza, High Dose Seasonal PF 10/07/2019   Influenza,inj,Quad PF,6+ Mos 10/22/2018   Influenza-Unspecified 10/31/2015, 10/23/2016, 10/19/2017, 10/23/2020, 10/02/2021, 10/03/2022   Moderna SARS-COV2 Booster Vaccination 10/09/2021, 04/24/2022, 11/03/2022   Moderna Sars-Covid-2 Vaccination 01/31/2020, 02/28/2020, 10/16/2020   Pneumococcal Conjugate-13 06/16/2018   Pneumococcal Polysaccharide-23 02/04/2012   Tdap 10/16/2018   Zoster Recombinat (Shingrix) 07/10/2017,  11/13/2017, 06/27/2018   Pertinent  Health Maintenance Due  Topic Date Due   INFLUENZA VACCINE  Completed   DEXA SCAN  Completed   COLONOSCOPY (Pts 45-14yr Insurance coverage will need to be confirmed)  Discontinued      04/27/2019    9:25 AM 08/24/2019    9:34 AM 10/24/2019   11:00 AM 03/08/2021    3:47 PM 08/29/2022   11:12 AM  Fall Risk  Falls in the past year? 1 0 1 0 0  Was there an injury with Fall? 1 0 1 0 0  Fall Risk Category Calculator 2 0 2 0 0  Fall Risk Category Moderate Low Moderate Low Low  Patient Fall Risk Level Moderate fall risk Low fall risk  Low fall risk Low fall risk  Patient at Risk for Falls Due to     No Fall Risks  Fall risk Follow up     Falls evaluation completed   Functional Status Survey:    Vitals:   01/01/23 1556  BP: (!) 146/71  Pulse: 76  Resp: 16  Temp: (!) 97.3 F (36.3 C)  TempSrc: Temporal  SpO2: 96%  Weight: 132 lb 12.8 oz (60.2 kg)  Height: '5\' 1"'$  (1.549 m)   Body mass index is 25.09 kg/m. Physical Exam Vitals and nursing note reviewed.  Constitutional:      General: She is not in acute distress.    Appearance: She is not diaphoretic.  HENT:     Head: Normocephalic and atraumatic.     Nose: Nose normal.     Mouth/Throat:     Mouth: Mucous membranes are moist.     Pharynx: Oropharynx is clear.  Eyes:     Conjunctiva/sclera: Conjunctivae normal.     Pupils: Pupils are equal, round, and reactive to light.  Neck:     Vascular: No JVD.  Cardiovascular:     Rate and Rhythm: Normal rate and regular rhythm.     Heart sounds: No murmur heard. Pulmonary:     Effort: Pulmonary effort is normal. No respiratory distress.     Breath sounds: Normal breath sounds. No wheezing.  Abdominal:     General: Bowel sounds are normal. There is no distension.     Palpations: Abdomen is soft.     Tenderness: There is no abdominal tenderness.  Skin:    General: Skin is warm and dry.  Neurological:     General: No focal deficit present.      Mental Status: She is alert. Mental status is at baseline.  Psychiatric:        Mood and Affect: Mood normal.     Labs reviewed: Recent Labs    05/02/22 0000  NA 146  K 4.3  CL 107  CO2 28*  BUN 20  CREATININE 0.8  CALCIUM 9.8   Recent Labs    05/02/22 0000  AST 18  ALT 12  ALKPHOS 98  ALBUMIN 0.4*   Recent Labs    01/17/22 0000 05/02/22 0000  WBC  5.2 5.0  HGB 12.6 12.9  HCT 37 38  PLT 201 246   Lab Results  Component Value Date   TSH 3.40 05/02/2022   No results found for: "HGBA1C" Lab Results  Component Value Date   CHOL 139 06/03/2022   HDL 49 06/03/2022   LDLCALC 76 06/03/2022   TRIG 69 06/03/2022    Significant Diagnostic Results in last 30 days:  No results found.  Assessment/Plan  1. Dementia of the Alzheimer's type, with late onset, with delusions ([place]) Progressive decline in cognition and physical function c/w the disease. Continue supportive care in the skilled environment.  2. Slow transit constipation  - docusate sodium (COLACE) 100 MG capsule; Take 1 capsule (100 mg total) by mouth 2 (two) times daily.  Dispense: 10 capsule; Refill: 0  3. Localized osteoporosis without current pathological fracture No longer ambulatory Not able to swallow calcium No further treatment indicated due to age/dementia  4. Pure hypercholesterolemia ON zocor   5. Primary insomnia Continue melatonin     Labs/tests ordered:  NA

## 2023-01-02 ENCOUNTER — Encounter: Payer: Self-pay | Admitting: Adult Health

## 2023-01-02 DIAGNOSIS — K5901 Slow transit constipation: Secondary | ICD-10-CM | POA: Insufficient documentation

## 2023-01-02 MED ORDER — DOCUSATE SODIUM 100 MG PO CAPS
100.0000 mg | ORAL_CAPSULE | Freq: Two times a day (BID) | ORAL | 0 refills | Status: DC
Start: 2023-01-02 — End: 2024-09-15

## 2023-01-08 ENCOUNTER — Non-Acute Institutional Stay (SKILLED_NURSING_FACILITY): Payer: Medicare Other | Admitting: Adult Health

## 2023-01-08 ENCOUNTER — Encounter: Payer: Self-pay | Admitting: Adult Health

## 2023-01-08 DIAGNOSIS — U071 COVID-19: Secondary | ICD-10-CM | POA: Diagnosis not present

## 2023-01-08 DIAGNOSIS — B309 Viral conjunctivitis, unspecified: Secondary | ICD-10-CM | POA: Diagnosis not present

## 2023-01-08 MED ORDER — LORATADINE 10 MG PO TABS: 10.0000 mg | ORAL_TABLET | Freq: Every day | ORAL | 0 refills | Status: DC

## 2023-01-08 NOTE — Progress Notes (Signed)
Location:   [facility] Room Number: [place] of Service:  SNF [phone]) Provider:  Royal Hawthorn, NP  Virgie Dad, MD  Patient Care Team: Virgie Dad, MD as PCP - General (Internal Medicine) Addison Lank, MD as Consulting Physician (Dermatology)  Extended Emergency Contact Information Primary Emergency Contact: Monte,[name] Address: [address] [place] [place], [place] [address] [name] of [place] Phone: [phone] Relation: Son  Code Status:  DNR Goals of care: Advanced Directive information    01/08/2023   12:07 PM  Advanced Directives  Does Patient Have a Medical Advance Directive? Yes  Type of Paramedic of [place];Living will;Out of facility DNR (pink MOST or yellow form)  Does patient want to make changes to medical advance directive? No - Patient declined  Copy of [place] in Chart? Yes - validated most recent copy scanned in chart (See row information)  Pre-existing out of facility DNR order (yellow form or pink MOST form) Yellow form placed in chart (order not valid for inpatient use)     Chief Complaint  Patient presents with   Acute Visit    Eye drainage    HPI:  Pt is a 89 y.o. female seen today for an acute visit for eye drainage/redness  PMH significant for dementia and HLD. Resides in skilled care.   Resident tested positive on 01/02/23 for Covid, symptoms were cough, runny nose, and sneezing.   She was placed on oxygen on 01/05/23 due to sats of 79%.  Currently she appears to be in no acute distress on 2 liters of oxygen sats 96%.  She is having water eyes with itching and erythema to the lids on both sides. Had some green drainage one day ago. Resolved now. Keeps scratching at her eyes and taking off her oxygen.      Past Medical History:  Diagnosis Date   Alzheimer's disease ([place])    High cholesterol    Uterine cancer [facility])    Past  Surgical History:  Procedure Laterality Date   [facility]   cataract surgery Bilateral     Allergies  Allergen Reactions   Penicillins Hives   Sulfa Antibiotics Hives    Allergies as of 01/08/2023       Reactions   Penicillins Hives   Sulfa Antibiotics Hives        Medication List        Accurate as of January 08, 2023 12:29 PM. If you have any questions, ask your nurse or doctor.          STOP taking these medications    magnesium hydroxide 400 MG/5ML suspension Commonly known as: MILK OF MAGNESIA Stopped by: Royal Hawthorn, NP       TAKE these medications    acetaminophen 325 MG tablet Commonly known as: TYLENOL Take 650 mg by mouth as needed for mild pain or fever.   docusate sodium 100 MG capsule Commonly known as: Colace Take 1 capsule (100 mg total) by mouth 2 (two) times daily.   ipratropium-albuterol 0.5-2.5 (3) MG/3ML Soln Commonly known as: DUONEB Take 3 mLs by nebulization 3 (three) times daily as needed.   melatonin 1 MG Tabs tablet Take by mouth at bedtime.   memantine 28 MG Cp24 24 hr capsule Commonly known as: NAMENDA XR Take 1 capsule (28 mg total) by mouth daily.   simvastatin  20 MG tablet Commonly known as: ZOCOR TAKE 1 TABLET DAILY   Vitamin D3 50 MCG (2000 UT) Tabs Take 1 tablet by mouth daily.        Review of Systems  Unable to perform ROS: Dementia    Immunization History  Administered Date(s) Administered   Fluad Quad(high Dose 65+) 10/03/2022   Influenza, High Dose Seasonal PF 10/07/2019   Influenza,inj,Quad PF,6+ Mos 10/22/2018   Influenza-Unspecified 10/31/2015, 10/23/2016, 10/19/2017, 10/23/2020, 10/02/2021, 10/03/2022   Moderna SARS-COV2 Booster Vaccination 10/09/2021, 04/24/2022, 11/03/2022   Moderna Sars-Covid-2 Vaccination 01/31/2020, 02/28/2020, 10/16/2020   Pneumococcal Conjugate-13 06/16/2018   Pneumococcal Polysaccharide-23 02/04/2012   Tdap 10/16/2018   Zoster  Recombinat (Shingrix) 07/10/2017, 11/13/2017, 06/27/2018   Pertinent  Health Maintenance Due  Topic Date Due   INFLUENZA VACCINE  Completed   DEXA SCAN  Completed   COLONOSCOPY (Pts 45-104yr Insurance coverage will need to be confirmed)  Discontinued      04/27/2019    9:25 AM 08/24/2019    9:34 AM 10/24/2019   11:00 AM 03/08/2021    3:47 PM 08/29/2022   11:12 AM  Fall Risk  Falls in the past year? 1 0 1 0 0  Was there an injury with Fall? 1 0 1 0 0  Fall Risk Category Calculator 2 0 2 0 0  Fall Risk Category Moderate Low Moderate Low Low  Patient Fall Risk Level Moderate fall risk Low fall risk  Low fall risk Low fall risk  Patient at Risk for Falls Due to     No Fall Risks  Fall risk Follow up     Falls evaluation completed   Functional Status Survey:    Vitals:   01/08/23 1205 01/08/23 1805  BP: (!) 186/76 (!) 150/80  Pulse: 62   Resp: 12   Temp: 97.9 F (36.6 C)   SpO2: 96%   Weight: 132 lb 12.8 oz (60.2 kg)   Height: '5\' 1"'$  (1.549 m)    Body mass index is 25.09 kg/m. Physical Exam Vitals and nursing note reviewed.  Constitutional:      General: She is not in acute distress.    Appearance: She is not diaphoretic.  HENT:     Head: Normocephalic and atraumatic.     Mouth/Throat:     Comments: Mouth appeared moist. She would not open her mouth all the way for exam Eyes:     Conjunctiva/sclera: Conjunctivae normal.     Pupils: Pupils are equal, round, and reactive to light.     Comments: Erythema and scaly skin to upper and lower lids bilaterally. No purulent drainage.   Neck:     Vascular: No JVD.  Cardiovascular:     Rate and Rhythm: Normal rate and regular rhythm.     Heart sounds: No murmur heard. Pulmonary:     Effort: Pulmonary effort is normal. No respiratory distress.     Breath sounds: Normal breath sounds. No wheezing.  Abdominal:     General: Bowel sounds are normal. There is no distension.     Palpations: Abdomen is soft.     Tenderness: There is  no abdominal tenderness.  Skin:    General: Skin is warm and dry.  Neurological:     Mental Status: She is alert and oriented to person, place, and time.     Labs reviewed: Recent Labs    05/02/22 0000  NA 146  K 4.3  CL 107  CO2 28*  BUN 20  CREATININE 0.8  CALCIUM  9.8   Recent Labs    05/02/22 0000  AST 18  ALT 12  ALKPHOS 98  ALBUMIN 0.4*   Recent Labs    01/17/22 0000 05/02/22 0000  WBC 5.2 5.0  HGB 12.6 12.9  HCT 37 38  PLT 201 246   Lab Results  Component Value Date   TSH 3.40 05/02/2022   No results found for: "HGBA1C" Lab Results  Component Value Date   CHOL 139 06/03/2022   HDL 49 06/03/2022   LDLCALC 76 06/03/2022   TRIG 69 06/03/2022    Significant Diagnostic Results in last 30 days:  No results found.  Assessment/Plan  1. Viral conjunctivitis Add Claritin 10 mg daily for 2 weeks  2. COVID-19 virus infection She is beyond the recommended time frame for antiviral therapy. Seems comfortable and nurse is attempting to wean 02.  Vaccinated with last admin 11/03/22.   Isolation for 10 days total in skilled care Goals of care comfort based with no hospitalizations.   Family/ staff Communication:   Labs/tests ordered: NA

## 2023-01-27 ENCOUNTER — Encounter: Payer: Self-pay | Admitting: Orthopedic Surgery

## 2023-01-27 ENCOUNTER — Non-Acute Institutional Stay (SKILLED_NURSING_FACILITY): Payer: Medicare Other | Admitting: Orthopedic Surgery

## 2023-01-27 DIAGNOSIS — R0902 Hypoxemia: Secondary | ICD-10-CM | POA: Diagnosis not present

## 2023-01-27 DIAGNOSIS — F02818 Dementia in other diseases classified elsewhere, unspecified severity, with other behavioral disturbance: Secondary | ICD-10-CM

## 2023-01-27 DIAGNOSIS — G301 Alzheimer's disease with late onset: Secondary | ICD-10-CM | POA: Diagnosis not present

## 2023-01-27 NOTE — Progress Notes (Signed)
Location:  [facility] Room Number: 141/A Place of Service:  SNF [phone]) Provider:  Yvonna Alanis, NP   Virgie Dad, MD  Patient Care Team: Virgie Dad, MD as PCP - General (Internal Medicine) Addison Lank, MD as Consulting Physician (Dermatology)  Extended Emergency Contact Information Primary Emergency Contact: Jungman,[name] Address: [address] [place], [place] [address] [name] of [place] Phone: [phone] Relation: Son  Code Status:  DNR Goals of care: Advanced Directive information    01/08/2023   12:07 PM  Advanced Directives  Does Patient Have a Medical Advance Directive? Yes  Type of Paramedic of [place];Living will;Out of facility DNR (pink MOST or yellow form)  Does patient want to make changes to medical advance directive? No - Patient declined  Copy of [place] in Chart? Yes - validated most recent copy scanned in chart (See row information)  Pre-existing out of facility DNR order (yellow form or pink MOST form) Yellow form placed in chart (order not valid for inpatient use)     Chief Complaint  Patient presents with   Acute Visit    hypoxia    HPI:  Pt is a 89 y.o. female seen today for acute visit due to hypoxia.   She currently resides on the skilled nursing unit at [facility] PMH: AD, constipation, osteoporosis, OAB, HLD, and insomnia.   01/05 she tested positive for covid. Symptoms included: cough, runny nose, and sneezing. 01/08 she was started on oxygen for sats 70's. Covid symptoms have resolved. Staff has been unable to wean oxygen. Nursing reports sats > 85% on 2 liters. She is a poor historian due to AD. Breathing is not labored, no cyanosis. I warmed her hand prior to placing pulse ox on her finger. O2 sats 94% on room air.   AD- no behaviors, dependent with all ADLs, hoyer transfer, unable to complete MMSE, some weight loss within past  few months, remains on Namenda  Past Medical History:  Diagnosis Date   Alzheimer's disease ([place])    High cholesterol    Uterine cancer ([place])    Past Surgical History:  Procedure Laterality Date   [facility]   cataract surgery Bilateral     Allergies  Allergen Reactions   Penicillins Hives   Sulfa Antibiotics Hives    Outpatient Encounter Medications as of 01/27/2023  Medication Sig   acetaminophen (TYLENOL) 325 MG tablet Take 650 mg by mouth as needed for mild pain or fever.   Cholecalciferol (VITAMIN D3) 2000 units TABS Take 1 tablet by mouth daily.   docusate sodium (COLACE) 100 MG capsule Take 1 capsule (100 mg total) by mouth 2 (two) times daily.   ipratropium-albuterol (DUONEB) 0.5-2.5 (3) MG/3ML SOLN Take 3 mLs by nebulization 3 (three) times daily as needed.   loratadine (CLARITIN) 10 MG tablet Take 1 tablet (10 mg total) by mouth daily for 14 days.   Melatonin 1 MG TABS Take by mouth at bedtime.   memantine (NAMENDA XR) 28 MG CP24 24 hr capsule Take 1 capsule (28 mg total) by mouth daily.   simvastatin (ZOCOR) 20 MG tablet TAKE 1 TABLET DAILY   No facility-administered encounter medications on file as of 01/27/2023.    Review of Systems  Unable to perform ROS: Dementia    Immunization History  Administered Date(s) Administered   Fluad Quad(high Dose 65+)  10/03/2022   Influenza, High Dose Seasonal PF 10/07/2019   Influenza,inj,Quad PF,6+ Mos 10/22/2018   Influenza-Unspecified 10/31/2015, 10/23/2016, 10/19/2017, 10/23/2020, 10/02/2021, 10/03/2022   Moderna SARS-COV2 Booster Vaccination 10/09/2021, 04/24/2022, 11/03/2022   Moderna Sars-Covid-2 Vaccination 01/31/2020, 02/28/2020, 10/16/2020   Pneumococcal Conjugate-13 06/16/2018   Pneumococcal Polysaccharide-23 02/04/2012   Tdap 10/16/2018   Zoster Recombinat (Shingrix) 07/10/2017, 11/13/2017, 06/27/2018   Pertinent  Health Maintenance Due  Topic Date Due   INFLUENZA VACCINE   Completed   DEXA SCAN  Completed   COLONOSCOPY (Pts 45-30yr Insurance coverage will need to be confirmed)  Discontinued      04/27/2019    9:25 AM 08/24/2019    9:34 AM 10/24/2019   11:00 AM 03/08/2021    3:47 PM 08/29/2022   11:12 AM  Fall Risk  Falls in the past year? 1 0 1 0 0  Was there an injury with Fall? 1 0 1 0 0  Fall Risk Category Calculator 2 0 2 0 0  Fall Risk Category (Retired) Moderate Low Moderate Low Low  (RETIRED) Patient Fall Risk Level Moderate fall risk Low fall risk  Low fall risk Low fall risk  Patient at Risk for Falls Due to     No Fall Risks  Fall risk Follow up     Falls evaluation completed   Functional Status Survey:    Vitals:   01/27/23 1549  BP: 138/73  Pulse: 76  Resp: 18  Temp: (!) 97 F (36.1 C)  SpO2: 96%  Weight: 132 lb 12.8 oz (60.2 kg)  Height: '5\' 1"'$  (1.549 m)   Body mass index is 25.09 kg/m. Physical Exam Vitals reviewed.  Constitutional:      General: She is not in acute distress. HENT:     Head: Normocephalic.  Eyes:     General:        Right eye: No discharge.        Left eye: No discharge.  Neck:     Vascular: No carotid bruit.  Cardiovascular:     Rate and Rhythm: Normal rate and regular rhythm.     Pulses: Normal pulses.     Heart sounds: Normal heart sounds.  Pulmonary:     Effort: Pulmonary effort is normal. No respiratory distress.     Breath sounds: Normal breath sounds. No wheezing or rales.  Abdominal:     General: Bowel sounds are normal. There is no distension.     Palpations: Abdomen is soft.     Tenderness: There is no abdominal tenderness.  Musculoskeletal:     Cervical back: Neck supple.     Right lower leg: No edema.     Left lower leg: No edema.  Lymphadenopathy:     Cervical: No cervical adenopathy.  Skin:    General: Skin is warm and dry.     Capillary Refill: Capillary refill takes less than 2 seconds.  Neurological:     General: No focal deficit present.     Mental Status: She is alert.  Mental status is at baseline.     Motor: Weakness present.     Gait: Gait abnormal.     Comments: Hoyer  Psychiatric:        Mood and Affect: Mood normal.        Behavior: Behavior normal.     Labs reviewed: Recent Labs    05/02/22 0000  NA 146  K 4.3  CL 107  CO2 28*  BUN 20  CREATININE 0.8  CALCIUM 9.8  Recent Labs    05/02/22 0000  AST 18  ALT 12  ALKPHOS 98  ALBUMIN 0.4*   Recent Labs    05/02/22 0000  WBC 5.0  HGB 12.9  HCT 38  PLT 246   Lab Results  Component Value Date   TSH 3.40 05/02/2022   No results found for: "HGBA1C" Lab Results  Component Value Date   CHOL 139 06/03/2022   HDL 49 06/03/2022   LDLCALC 76 06/03/2022   TRIG 69 06/03/2022    Significant Diagnostic Results in last 30 days:  No results found.  Assessment/Plan 1. Hypoxia - 01/08 started oxygen due to recent covid - covid resolved> unable to wean oxygen - lung sounds clear - warmed hands prior to placing pulse ox> O2 sats 94% on room air - cont duonebs prn - wean oxygen, keep sats > 90%  2. Dementia of the Alzheimer's type, with late onset, with delusions ([place]) - no behaviors - doing well in skilled - dependent with all ADLs - cont Therapist, music Communication: plan discussed with patient and nurse  Labs/tests ordered:  none

## 2023-01-29 ENCOUNTER — Encounter: Payer: Self-pay | Admitting: Adult Health

## 2023-01-29 ENCOUNTER — Non-Acute Institutional Stay (SKILLED_NURSING_FACILITY): Payer: Medicare Other | Admitting: Adult Health

## 2023-01-29 DIAGNOSIS — F02818 Dementia in other diseases classified elsewhere, unspecified severity, with other behavioral disturbance: Secondary | ICD-10-CM | POA: Diagnosis not present

## 2023-01-29 DIAGNOSIS — M816 Localized osteoporosis [Lequesne]: Secondary | ICD-10-CM

## 2023-01-29 DIAGNOSIS — E7800 Pure hypercholesterolemia, unspecified: Secondary | ICD-10-CM | POA: Diagnosis not present

## 2023-01-29 DIAGNOSIS — G301 Alzheimer's disease with late onset: Secondary | ICD-10-CM

## 2023-01-29 DIAGNOSIS — K5901 Slow transit constipation: Secondary | ICD-10-CM | POA: Diagnosis not present

## 2023-01-29 NOTE — Progress Notes (Signed)
Location:  [facility] Room Number: [place] of Service:  SNF [phone]) Provider:  Royal Hawthorn, NP   Patient Care Team: Virgie Dad, MD as PCP - General (Internal Medicine) Addison Lank, MD as Consulting Physician (Dermatology)  Extended Emergency Contact Information Primary Emergency Contact: Mclean,Karen Address: [address] [place] M[place], [place] [address] Karen Mclean of [place] Phone: [phone] Relation: Son  Code Status:  DNR Goals of care: Advanced Directive information    01/29/2023   11:35 AM  Advanced Directives  Does Patient Have a Medical Advance Directive? Yes  Type of Paramedic of [place];Living will;Out of facility DNR (pink MOST or yellow form)  Does patient want to make changes to medical advance directive? No - Patient declined  Copy of [place] in Chart? Yes - validated most recent copy scanned in chart (See row information)  Pre-existing out of facility DNR order (yellow form or pink MOST form) Yellow form placed in chart (order not valid for inpatient use)     Chief Complaint  Patient presents with   Medical Management of Chronic Issues    Routine visit. Discuss need for AWV and additional covid booster    Had covid booster 10/2022, recently recovered from covid   HPI:  Pt is a 89 y.o. female seen today for medical management of chronic diseases.    PMH significant for dementia and HLD. Resides in skilled care.   Resident tested positive on 01/02/23 for Covid, symptoms were cough, runny nose, and sneezing.  Did not take any antiviral.  Difficulty swallowing pills.  Required 2 liters of oxygen, no acute distress. Symptoms resolved. Off oxygen sat 90% RA.    Weight trending down 5 lbs since Nov.  Continues on a regular diet. No reports of choking.   AD: requires assistance for all ADLs. Verbal but not able to answer questions. Incontinent of bowel and  bladder. Has some delusional thinking. Counting, repetitive. No aggression or outbursts. Not able to do MMSE.  Remains on zocor for HLD per POA request.   No BM since 1/28 on colace.   Past Medical History:  Diagnosis Date   Alzheimer's disease ([place])    High cholesterol    Uterine cancer ([place])    Past Surgical History:  Procedure Laterality Date   [facility]   cataract surgery Bilateral     Allergies  Allergen Reactions   Penicillins Hives   Sulfa Antibiotics Hives    Outpatient Encounter Medications as of 01/29/2023  Medication Sig   acetaminophen (TYLENOL) 325 MG tablet Take 650 mg by mouth as needed for mild pain or fever.   Cholecalciferol (VITAMIN D3) 2000 units TABS Take 1 tablet by mouth daily.   docusate sodium (COLACE) 100 MG capsule Take 1 capsule (100 mg total) by mouth 2 (two) times daily.   ipratropium-albuterol (DUONEB) 0.5-2.5 (3) MG/3ML SOLN Take 3 mLs by nebulization 3 (three) times daily as needed.   Melatonin 1 MG TABS Take by mouth at bedtime.   memantine (NAMENDA XR) 28 MG CP24 24 hr capsule Take 1 capsule (28 mg total) by mouth daily.   simvastatin (ZOCOR) 20 MG tablet TAKE 1 TABLET DAILY   [DISCONTINUED] loratadine (CLARITIN) 10 MG tablet Take 1 tablet (10 mg total) by mouth daily for 14 days.   No facility-administered encounter medications on file as of 01/29/2023.  Review of Systems  Unable to perform ROS: Dementia    Immunization History  Administered Date(s) Administered   Fluad Quad(high Dose 65+) 10/03/2022   Influenza, High Dose Seasonal PF 10/07/2019   Influenza,inj,Quad PF,6+ Mos 10/22/2018   Influenza-Unspecified 10/31/2015, 10/23/2016, 10/19/2017, 10/23/2020, 10/02/2021, 10/03/2022   Moderna SARS-COV2 Booster Vaccination 10/09/2021, 04/24/2022, 11/03/2022   Moderna Sars-Covid-2 Vaccination 01/31/2020, 02/28/2020, 10/16/2020   Pneumococcal Conjugate-13 06/16/2018   Pneumococcal Polysaccharide-23  02/04/2012   Tdap 10/16/2018   Zoster Recombinat (Shingrix) 07/10/2017, 11/13/2017, 06/27/2018   Pertinent  Health Maintenance Due  Topic Date Due   INFLUENZA VACCINE  Completed   DEXA SCAN  Completed   COLONOSCOPY (Pts 45-56yr Insurance coverage will need to be confirmed)  Discontinued      04/27/2019    9:25 AM 08/24/2019    9:34 AM 10/24/2019   11:00 AM 03/08/2021    3:47 PM 08/29/2022   11:12 AM  Fall Risk  Falls in the past year? 1 0 1 0 0  Was there an injury with Fall? 1 0 1 0 0  Fall Risk Category Calculator 2 0 2 0 0  Fall Risk Category (Retired) Moderate Low Moderate Low Low  (RETIRED) Patient Fall Risk Level Moderate fall risk Low fall risk  Low fall risk Low fall risk  Patient at Risk for Falls Due to     No Fall Risks  Fall risk Follow up     Falls evaluation completed   Functional Status Survey:    Vitals:   01/29/23 1125  BP: 102/61  Pulse: 73  Weight: 132 lb (59.9 kg)  Height: '5\' 1"'$  (1.549 m)   Body mass index is 24.94 kg/m. Physical Exam Vitals and nursing note reviewed.  Constitutional:      General: She is not in acute distress.    Appearance: She is not diaphoretic.  HENT:     Head: Normocephalic and atraumatic.  Neck:     Vascular: No JVD.  Cardiovascular:     Rate and Rhythm: Normal rate and regular rhythm.     Heart sounds: No murmur heard. Pulmonary:     Effort: Pulmonary effort is normal. No respiratory distress.     Breath sounds: Normal breath sounds. No wheezing.  Abdominal:     General: Bowel sounds are normal. There is no distension.     Palpations: Abdomen is soft.     Tenderness: There is no abdominal tenderness.  Musculoskeletal:     Right lower leg: No edema.     Left lower leg: No edema.  Skin:    General: Skin is warm and dry.  Neurological:     General: No focal deficit present.     Mental Status: She is alert. Mental status is at baseline.  Psychiatric:        Mood and Affect: Mood normal.     Labs  reviewed: Recent Labs    05/02/22 0000  NA 146  K 4.3  CL 107  CO2 28*  BUN 20  CREATININE 0.8  CALCIUM 9.8   Recent Labs    05/02/22 0000  AST 18  ALT 12  ALKPHOS 98  ALBUMIN 0.4*   Recent Labs    05/02/22 0000  WBC 5.0  HGB 12.9  HCT 38  PLT 246   Lab Results  Component Value Date   TSH 3.40 05/02/2022   No results found for: "HGBA1C" Lab Results  Component Value Date   CHOL 139 06/03/2022   HDL 49 06/03/2022  LDLCALC 76 06/03/2022   TRIG 69 06/03/2022    Significant Diagnostic Results in last 30 days:  No results found.  Assessment/Plan  1. Dementia of the Alzheimer's type, with late onset, with delusions ([place]) Severe stage Goals of care comfort based Has DNR and most form Progressive decline in cognition and physical function c/w the disease. Continue supportive care in the skilled environment.  2. Slow transit constipation Add miralax qod Continue colace  3. Localized osteoporosis without current pathological fracture BMD 08/22/21 T score -2.8 Not on medication due to non weight bearing status and progressive dementia  4. Pure hypercholesterolemia Continue zocor    Family/ staff Communication: nurse  Labs/tests ordered:  NA

## 2023-01-30 ENCOUNTER — Encounter: Payer: Self-pay | Admitting: Adult Health

## 2023-01-30 MED ORDER — POLYETHYLENE GLYCOL 3350 17 GM/SCOOP PO POWD: 17.0000 g | ORAL | 1 refills | Status: DC

## 2023-03-02 ENCOUNTER — Non-Acute Institutional Stay (SKILLED_NURSING_FACILITY): Payer: Medicare Other | Admitting: Adult Health

## 2023-03-02 ENCOUNTER — Encounter: Payer: Self-pay | Admitting: Adult Health

## 2023-03-02 DIAGNOSIS — E7800 Pure hypercholesterolemia, unspecified: Secondary | ICD-10-CM | POA: Diagnosis not present

## 2023-03-02 DIAGNOSIS — K5901 Slow transit constipation: Secondary | ICD-10-CM | POA: Diagnosis not present

## 2023-03-02 DIAGNOSIS — F5101 Primary insomnia: Secondary | ICD-10-CM

## 2023-03-02 DIAGNOSIS — F02818 Dementia in other diseases classified elsewhere, unspecified severity, with other behavioral disturbance: Secondary | ICD-10-CM | POA: Diagnosis not present

## 2023-03-02 DIAGNOSIS — M816 Localized osteoporosis [Lequesne]: Secondary | ICD-10-CM

## 2023-03-02 DIAGNOSIS — G301 Alzheimer's disease with late onset: Secondary | ICD-10-CM

## 2023-03-02 NOTE — Progress Notes (Signed)
Location:  [facility] of Service:  SNF (31) Provider:  Royal Hawthorn, NP   Patient Care Team: Virgie Dad, MD as PCP - General (Internal Medicine) Addison Lank, MD as Consulting Physician (Dermatology)  Extended Emergency Contact Information Primary Emergency Contact: Wolf,[name] Address: [address] [place], [place] [address] [name] of [place] Phone: [phone] Relation: Son  Code Status:  DNR Goals of care: Advanced Directive information    01/29/2023   11:35 AM  Advanced Directives  Does Patient Have a Medical Advance Directive? Yes  Type of Paramedic of [place];Living will;Out of facility DNR (pink MOST or yellow form)  Does patient want to make changes to medical advance directive? No - Patient declined  Copy of [place] in Chart? Yes - validated most recent copy scanned in chart (See row information)  Pre-existing out of facility DNR order (yellow form or pink MOST form) Yellow form placed in chart (order not valid for inpatient use)     Chief Complaint  Patient presents with   Medical Management of Chronic Issues    HPI:  Pt is a 89 y.o. female seen today for medical management of chronic diseases.    PMH significant for dementia and HLD. Resides in skilled care.   AD: requires assistance for all ADLs. Verbal but not able to answer questions. Incontinent of bowel and bladder. Has some delusional thinking. Counting, repetitive. No aggression or outbursts. Not able to do MMSE.  No falls  Gained 5 lbs in the past month, very pleasant  Remains on zocor for HLD per POA request.   Started on miralax for constipation. Also on Colace. LBM 02/28/23  Past Medical History:  Diagnosis Date   Alzheimer's disease ([place])    High cholesterol    Uterine cancer ([place])    Past Surgical History:  Procedure Laterality Date   [facility]   cataract surgery Bilateral     Allergies  Allergen Reactions   Penicillins Hives   Sulfa Antibiotics Hives    Outpatient Encounter Medications as of 03/02/2023  Medication Sig   acetaminophen (TYLENOL) 325 MG tablet Take 650 mg by mouth as needed for mild pain or fever.   Cholecalciferol (VITAMIN D3) 2000 units TABS Take 1 tablet by mouth daily.   docusate sodium (COLACE) 100 MG capsule Take 1 capsule (100 mg total) by mouth 2 (two) times daily.   ipratropium-albuterol (DUONEB) 0.5-2.5 (3) MG/3ML SOLN Take 3 mLs by nebulization 3 (three) times daily as needed.   Melatonin 1 MG TABS Take by mouth at bedtime.   memantine (NAMENDA XR) 28 MG CP24 24 hr capsule Take 1 capsule (28 mg total) by mouth daily.   polyethylene glycol powder (GLYCOLAX/MIRALAX) 17 GM/SCOOP powder Take 17 g by mouth every other day.   simvastatin (ZOCOR) 20 MG tablet TAKE 1 TABLET DAILY   No facility-administered encounter medications on file as of 03/02/2023.    Review of Systems  Unable to perform ROS: Dementia    Immunization History  Administered Date(s) Administered   Fluad Quad(high Dose 65+) 10/03/2022   Influenza, High Dose Seasonal PF 10/07/2019   Influenza,inj,Quad PF,6+ Mos 10/22/2018   Influenza-Unspecified 10/31/2015, 10/23/2016, 10/19/2017, 10/23/2020, 10/02/2021, 10/03/2022   Moderna SARS-COV2 Booster Vaccination 10/09/2021, 04/24/2022, 11/03/2022   Moderna Sars-Covid-2 Vaccination 01/31/2020, 02/28/2020, 10/16/2020   Pneumococcal Conjugate-13 06/16/2018   Pneumococcal Polysaccharide-23  02/04/2012   Tdap 10/16/2018   Zoster Recombinat (Shingrix) 07/10/2017, 11/13/2017, 06/27/2018   Pertinent  Health Maintenance Due  Topic Date Due   INFLUENZA VACCINE  Completed   DEXA SCAN  Completed   COLONOSCOPY (Pts 45-57yr Insurance coverage will need to be confirmed)  Discontinued      04/27/2019    9:25 AM 08/24/2019    9:34 AM 10/24/2019   11:00 AM 03/08/2021    3:47 PM 08/29/2022    11:12 AM  Fall Risk  Falls in the past year? 1 0 1 0 0  Was there an injury with Fall? 1 0 1 0 0  Fall Risk Category Calculator 2 0 2 0 0  Fall Risk Category (Retired) Moderate Low Moderate Low Low  (RETIRED) Patient Fall Risk Level Moderate fall risk Low fall risk  Low fall risk Low fall risk  Patient at Risk for Falls Due to     No Fall Risks  Fall risk Follow up     Falls evaluation completed   Functional Status Survey:    Vitals:   03/02/23 1202  Weight: 137 lb (62.1 kg)   Body mass index is 25.89 kg/m. Wt Readings from Last 3 Encounters:  03/02/23 137 lb (62.1 kg)  01/29/23 132 lb (59.9 kg)  01/27/23 132 lb 12.8 oz (60.2 kg)    Physical Exam Vitals and nursing note reviewed.  Constitutional:      General: She is not in acute distress.    Appearance: She is not diaphoretic.  HENT:     Head: Normocephalic and atraumatic.  Neck:     Vascular: No JVD.  Cardiovascular:     Rate and Rhythm: Normal rate and regular rhythm.     Heart sounds: No murmur heard. Pulmonary:     Effort: Pulmonary effort is normal. No respiratory distress.     Breath sounds: Normal breath sounds. No wheezing.  Abdominal:     General: Bowel sounds are normal. There is no distension.     Palpations: Abdomen is soft.     Tenderness: There is no abdominal tenderness.  Musculoskeletal:     Right lower leg: No edema.     Left lower leg: No edema.  Skin:    General: Skin is warm and dry.  Neurological:     General: No focal deficit present.     Mental Status: She is alert. Mental status is at baseline.  Psychiatric:        Mood and Affect: Mood normal.     Labs reviewed: Recent Labs    05/02/22 0000  NA 146  K 4.3  CL 107  CO2 28*  BUN 20  CREATININE 0.8  CALCIUM 9.8    Recent Labs    05/02/22 0000  AST 18  ALT 12  ALKPHOS 98  ALBUMIN 0.4*    Recent Labs    05/02/22 0000  WBC 5.0  HGB 12.9  HCT 38  PLT 246    Lab Results  Component Value Date   TSH 3.40  05/02/2022   No results found for: "HGBA1C" Lab Results  Component Value Date   CHOL 139 06/03/2022   HDL 49 06/03/2022   LDLCALC 76 06/03/2022   TRIG 69 06/03/2022    Significant Diagnostic Results in last 30 days:  No results found.  Assessment/Plan  1. Dementia of the Alzheimer's type, with late onset, with delusions ([place] Severe stage Goals of care comfort based Has DNR and most form Progressive decline in  cognition and physical function c/w the disease. Continue supportive care in the skilled environment.  2. Slow transit constipation Improved  Continue miralax and colace  3. Localized osteoporosis without current pathological fracture BMD 08/22/21 T score -2.8 Not on medication due to non weight bearing status and progressive dementia  4. Pure hypercholesterolemia Continue zocor   5. Insomnia No reports of difficult sleeping Continue melatonin   Family/ staff Communication: nurse  Labs/tests ordered:  NA

## 2023-03-23 DIAGNOSIS — Z961 Presence of intraocular lens: Secondary | ICD-10-CM | POA: Diagnosis not present

## 2023-04-01 ENCOUNTER — Non-Acute Institutional Stay (SKILLED_NURSING_FACILITY): Payer: Medicare Other | Admitting: Internal Medicine

## 2023-04-01 ENCOUNTER — Encounter: Payer: Self-pay | Admitting: Internal Medicine

## 2023-04-01 DIAGNOSIS — F5101 Primary insomnia: Secondary | ICD-10-CM

## 2023-04-01 DIAGNOSIS — M816 Localized osteoporosis [Lequesne]: Secondary | ICD-10-CM | POA: Diagnosis not present

## 2023-04-01 DIAGNOSIS — G301 Alzheimer's disease with late onset: Secondary | ICD-10-CM | POA: Diagnosis not present

## 2023-04-01 DIAGNOSIS — E7800 Pure hypercholesterolemia, unspecified: Secondary | ICD-10-CM

## 2023-04-01 DIAGNOSIS — K5901 Slow transit constipation: Secondary | ICD-10-CM | POA: Diagnosis not present

## 2023-04-01 DIAGNOSIS — F02818 Dementia in other diseases classified elsewhere, unspecified severity, with other behavioral disturbance: Secondary | ICD-10-CM | POA: Diagnosis not present

## 2023-04-01 NOTE — Progress Notes (Signed)
[facility] Room Number: 141-A Place of Service:  SNF [phone]) Provider:  Merian Capron    Patient Care Team: Mahlon Gammon, MD as PCP - General (Internal Medicine) Girard Cooter, MD as Consulting Physician (Dermatology)  Extended Emergency Contact Information Primary Emergency Contact: Frieze,[name] Address: [address] [place], [place] [address] [place] of [place] Home Phone: [phone] Relation: Son  Code Status:  DNR Goals of care: Advanced Directive information    04/01/2023    4:21 PM  Advanced Directives  Does Patient Have a Medical Advance Directive? Yes  Type of Estate agent of [place];Living will;Out of facility DNR (pink MOST or yellow form)  Does patient want to make changes to medical advance directive? No - Patient declined  Copy of Healthcare Power of Attorney in Chart? Yes - validated most recent copy scanned in chart (See row information)     Chief Complaint  Patient presents with   Medical Management of Chronic Issues    Routine Visit.    HPI:  Pt is a 89 y.o. female seen today for medical management of chronic diseases.    Lives in SNF in [facility]   Patient has h/o Alzheimer's Disease and HLD  She is now Non Ambulatory Hoyer lift No Behavior issues No Nursing issues Wt Readings from Last 3 Encounters:  04/01/23 138 lb (62.6 kg)  03/02/23 137 lb (62.1 kg)  01/29/23 132 lb (59.9 kg)     Past Medical History:  Diagnosis Date   Alzheimer's disease    High cholesterol    Uterine cancer    Past Surgical History:  Procedure Laterality Date   ABDOMINAL HYSTERECTOMY  1997   [facility]   cataract surgery Bilateral     Allergies  Allergen Reactions   Penicillins Hives   Sulfa Antibiotics Hives    Allergies as of 04/01/2023       Reactions   Penicillins Hives   Sulfa Antibiotics Hives        Medication List        Accurate as of  April 01, 2023  4:22 PM. If you have any questions, ask your nurse or doctor.          acetaminophen 325 MG tablet Commonly known as: TYLENOL Take 650 mg by mouth as needed for mild pain or fever.   docusate sodium 100 MG capsule Commonly known as: Colace Take 1 capsule (100 mg total) by mouth 2 (two) times daily.   ipratropium-albuterol 0.5-2.5 (3) MG/3ML Soln Commonly known as: DUONEB Take 3 mLs by nebulization 3 (three) times daily as needed.   melatonin 1 MG Tabs tablet Take by mouth at bedtime.   memantine 28 MG Cp24 24 hr capsule Commonly known as: NAMENDA XR Take 1 capsule (28 mg total) by mouth daily.   polyethylene glycol powder 17 GM/SCOOP powder Commonly known as: GLYCOLAX/MIRALAX Take 17 g by mouth every other day.   simvastatin 20 MG tablet Commonly known as: ZOCOR TAKE 1 TABLET DAILY   Vitamin D3 50 MCG (2000 UT) Tabs Generic drug: Cholecalciferol Take 1 tablet by mouth daily.        Review of Systems  Unable to perform ROS: Dementia    Immunization History  Administered Date(s) Administered   Fluad Quad(high Dose 65+) 10/03/2022   Influenza, High Dose Seasonal PF 10/07/2019   Influenza,inj,Quad PF,6+ Mos 10/22/2018   Influenza-Unspecified 10/31/2015, 10/23/2016, 10/19/2017,  10/23/2020, 10/02/2021, 10/03/2022   Moderna SARS-COV2 Booster Vaccination 10/09/2021, 04/24/2022, 11/03/2022   Moderna Sars-Covid-2 Vaccination 01/31/2020, 02/28/2020, 10/16/2020   Pneumococcal Conjugate-13 06/16/2018   Pneumococcal Polysaccharide-23 02/04/2012   Tdap 10/16/2018   Zoster Recombinat (Shingrix) 07/10/2017, 11/13/2017, 06/27/2018   Pertinent  Health Maintenance Due  Topic Date Due   INFLUENZA VACCINE  07/30/2023   DEXA SCAN  Completed   COLONOSCOPY (Pts 45-77yrs Insurance coverage will need to be confirmed)  Discontinued      08/24/2019    9:34 AM 10/24/2019   11:00 AM 03/08/2021    3:47 PM 08/29/2022   11:12 AM 03/02/2023   12:20 PM  Fall Risk  Falls  in the past year? 0 1 0 0 0  Was there an injury with Fall? 0 1 0 0 0  Fall Risk Category Calculator 0 2 0 0 0  Fall Risk Category (Retired) Low Moderate Low Low   (RETIRED) Patient Fall Risk Level Low fall risk  Low fall risk Low fall risk   Patient at Risk for Falls Due to    No Fall Risks Impaired balance/gait;Impaired mobility  Fall risk Follow up    Falls evaluation completed Falls evaluation completed   Functional Status Survey:    Vitals:   04/01/23 1619  BP: 123/77  Pulse: 77  Resp: 18  Temp: (!) 97.2 F (36.2 C)  SpO2: 91%  Weight: 138 lb (62.6 kg)  Height:  (1.549 m)   Body mass index is 26.07 kg/m. Physical Exam Vitals reviewed.  Constitutional:      Appearance: Normal appearance.  HENT:     Head: Normocephalic.     Nose: Nose normal.     Mouth/Throat:     Mouth: Mucous membranes are moist.     Pharynx: Oropharynx is clear.  Eyes:     Pupils: Pupils are equal, round, and reactive to light.  Cardiovascular:     Rate and Rhythm: Normal rate and regular rhythm.     Pulses: Normal pulses.     Heart sounds: Normal heart sounds. No murmur heard. Pulmonary:     Effort: Pulmonary effort is normal.     Breath sounds: Normal breath sounds.  Abdominal:     General: Abdomen is flat. Bowel sounds are normal.     Palpations: Abdomen is soft.  Musculoskeletal:        General: No swelling.     Cervical back: Neck supple.  Skin:    General: Skin is warm.  Neurological:     General: No focal deficit present.     Mental Status: She is alert.     Comments: Has aphasia now Does not follows Commands  Psychiatric:        Mood and Affect: Mood normal.        Thought Content: Thought content normal.     Labs reviewed: Recent Labs    05/02/22 0000  NA 146  K 4.3  CL 107  CO2 28*  BUN 20  CREATININE 0.8  CALCIUM 9.8   Recent Labs    05/02/22 0000  AST 18  ALT 12  ALKPHOS 98  ALBUMIN 0.4*   Recent Labs    05/02/22 0000  WBC 5.0  HGB 12.9  HCT  38  PLT 246   Lab Results  Component Value Date   TSH 3.40 05/02/2022   No results found for: "HGBA1C" Lab Results  Component Value Date   CHOL 139 06/03/2022   HDL 49 06/03/2022  LDLCALC 76 06/03/2022   TRIG 69 06/03/2022    Significant Diagnostic Results in last 30 days:  No results found.  Assessment/Plan 1. Dementia of the Alzheimer's type, with late onset, with delusions Patient goals are comfort On Namenda  2. Slow transit constipation On Colace  3. Localized osteoporosis without current pathological fracture BMD 08/22/21 T score -2.8 Decided not to pursue treatment due to her swallowing issues and Goals of care  4. Pure hypercholesterolemia On statin LDL checked in 06/23  5. Primary insomnia Melatonin    Family/ staff Communication:   Labs/tests ordered:

## 2023-04-22 DIAGNOSIS — M6259 Muscle wasting and atrophy, not elsewhere classified, multiple sites: Secondary | ICD-10-CM | POA: Diagnosis not present

## 2023-04-22 DIAGNOSIS — G301 Alzheimer's disease with late onset: Secondary | ICD-10-CM | POA: Diagnosis not present

## 2023-04-23 DIAGNOSIS — M6259 Muscle wasting and atrophy, not elsewhere classified, multiple sites: Secondary | ICD-10-CM | POA: Diagnosis not present

## 2023-04-23 DIAGNOSIS — G301 Alzheimer's disease with late onset: Secondary | ICD-10-CM | POA: Diagnosis not present

## 2023-04-27 DIAGNOSIS — R293 Abnormal posture: Secondary | ICD-10-CM | POA: Diagnosis not present

## 2023-04-27 DIAGNOSIS — G301 Alzheimer's disease with late onset: Secondary | ICD-10-CM | POA: Diagnosis not present

## 2023-04-28 DIAGNOSIS — M6259 Muscle wasting and atrophy, not elsewhere classified, multiple sites: Secondary | ICD-10-CM | POA: Diagnosis not present

## 2023-04-28 DIAGNOSIS — G301 Alzheimer's disease with late onset: Secondary | ICD-10-CM | POA: Diagnosis not present

## 2023-05-03 DIAGNOSIS — M6259 Muscle wasting and atrophy, not elsewhere classified, multiple sites: Secondary | ICD-10-CM | POA: Diagnosis not present

## 2023-05-03 DIAGNOSIS — G301 Alzheimer's disease with late onset: Secondary | ICD-10-CM | POA: Diagnosis not present

## 2023-05-04 DIAGNOSIS — R293 Abnormal posture: Secondary | ICD-10-CM | POA: Diagnosis not present

## 2023-05-04 DIAGNOSIS — G301 Alzheimer's disease with late onset: Secondary | ICD-10-CM | POA: Diagnosis not present

## 2023-05-07 ENCOUNTER — Encounter: Payer: Self-pay | Admitting: Adult Health

## 2023-05-07 ENCOUNTER — Non-Acute Institutional Stay (SKILLED_NURSING_FACILITY): Payer: Medicare Other | Admitting: Adult Health

## 2023-05-07 DIAGNOSIS — M159 Polyosteoarthritis, unspecified: Secondary | ICD-10-CM

## 2023-05-07 DIAGNOSIS — F02818 Dementia in other diseases classified elsewhere, unspecified severity, with other behavioral disturbance: Secondary | ICD-10-CM | POA: Diagnosis not present

## 2023-05-07 DIAGNOSIS — M816 Localized osteoporosis [Lequesne]: Secondary | ICD-10-CM

## 2023-05-07 DIAGNOSIS — G301 Alzheimer's disease with late onset: Secondary | ICD-10-CM

## 2023-05-07 DIAGNOSIS — K5901 Slow transit constipation: Secondary | ICD-10-CM

## 2023-05-07 DIAGNOSIS — E7800 Pure hypercholesterolemia, unspecified: Secondary | ICD-10-CM

## 2023-05-07 DIAGNOSIS — M150 Primary generalized (osteo)arthritis: Secondary | ICD-10-CM

## 2023-05-07 NOTE — Progress Notes (Signed)
Location[facility] Room Number: 141a Place of Service:  SNF [phone]) Provider:  Tamsen Roers, MD  Patient Care Team: Mahlon Gammon, MD as PCP - General (Internal Medicine) Girard Cooter, MD as Consulting Physician (Dermatology)  Extended Emergency Contact Information Primary Emergency Contact: Syme,[name] Address: [address] [place], [place] [address] [place] of [place] Home Phone: [phone] Relation: Son  Code Status:  DNR Goals of care: Advanced Directive information    05/07/2023   10:20 AM  Advanced Directives  Does Patient Have a Medical Advance Directive? Yes  Type of Estate agent of [place];Living will;Out of facility DNR (pink MOST or yellow form)  Does patient want to make changes to medical advance directive? No - Patient declined  Copy of Healthcare Power of Attorney in Chart? Yes - validated most recent copy scanned in chart (See row information)     Chief Complaint  Patient presents with   Medical Management of Chronic Issues    Patient is being seen for Routine visit    Quality Metric Gaps    Patient need AWV    HPI:  Pt is a 89 y.o. female seen today for medical management of chronic diseases.   Resides in skilled care due to severe Alz dementia. Not able to answer questions for MMSE. Intermittently does f/c and answer questions. Uses hoyer lift for transfers. Needs assistance with all ADls and is incontinent. Has behaviors of counting, can have delusions. No agitation and aggression.  Goals of care are comfort based with no hospitalizations.   Seen by therapy and recommend daily stretching regimen before getting out of bed.   Saw ophthalmology 4/25 with normal eye exam.   Slow steady weight gain over time  Wt Readings from Last 3 Encounters:  05/07/23 140 lb (63.5 kg)  04/01/23 138 lb (62.6 kg)  03/02/23 137 lb (62.1 kg)    HLD on zocor,  husband wanting to keep for now. No s/e. Lab Results  Component Value Date   LDLCALC 76 06/03/2022    Past Medical History:  Diagnosis Date   Alzheimer's disease (HCC)    High cholesterol    Uterine cancer (HCC)    Past Surgical History:  Procedure Laterality Date   ABDOMINAL HYSTERECTOMY  1997   [facility]   cataract surgery Bilateral     Allergies  Allergen Reactions   Penicillins Hives   Sulfa Antibiotics Hives    Outpatient Encounter Medications as of 05/07/2023  Medication Sig   acetaminophen (TYLENOL) 325 MG tablet Take 650 mg by mouth as needed for mild pain or fever.   Cholecalciferol (VITAMIN D3) 2000 units TABS Take 1 tablet by mouth daily.   docusate sodium (COLACE) 100 MG capsule Take 1 capsule (100 mg total) by mouth 2 (two) times daily.   ipratropium-albuterol (DUONEB) 0.5-2.5 (3) MG/3ML SOLN Take 3 mLs by nebulization 3 (three) times daily as needed.   Melatonin 1 MG TABS Take by mouth at bedtime.   memantine (NAMENDA XR) 28 MG CP24 24 hr capsule Take 1 capsule (28 mg total) by mouth daily.   polyethylene glycol powder (GLYCOLAX/MIRALAX) 17 GM/SCOOP powder Take 17 g by mouth every other day.   simvastatin (ZOCOR) 20 MG tablet TAKE 1 TABLET DAILY   No facility-administered encounter medications on file as of 05/07/2023.    Review of Systems  Unable to perform ROS: Dementia  Immunization History  Administered Date(s) Administered   Fluad Quad(high Dose 65+) 10/03/2022   Influenza, High Dose Seasonal PF 10/07/2019   Influenza,inj,Quad PF,6+ Mos 10/22/2018   Influenza-Unspecified 10/31/2015, 10/23/2016, 10/19/2017, 10/23/2020, 10/02/2021, 10/03/2022   Moderna SARS-COV2 Booster Vaccination 10/09/2021, 04/24/2022, 11/03/2022, 04/24/2023   Moderna Sars-Covid-2 Vaccination 01/31/2020, 02/28/2020, 10/16/2020   Pneumococcal Conjugate-13 06/16/2018   Pneumococcal Polysaccharide-23 02/04/2012   Tdap 10/16/2018   Zoster Recombinat (Shingrix) 07/10/2017,  11/13/2017, 06/27/2018   Pertinent  Health Maintenance Due  Topic Date Due   INFLUENZA VACCINE  07/30/2023   DEXA SCAN  Completed   COLONOSCOPY (Pts 45-76yrs Insurance coverage will need to be confirmed)  Discontinued      08/24/2019    9:34 AM 10/24/2019   11:00 AM 03/08/2021    3:47 PM 08/29/2022   11:12 AM 03/02/2023   12:20 PM  Fall Risk  Falls in the past year? 0 1 0 0 0  Was there an injury with Fall? 0 1 0 0 0  Fall Risk Category Calculator 0 2 0 0 0  Fall Risk Category (Retired) Low Moderate Low Low   (RETIRED) Patient Fall Risk Level Low fall risk  Low fall risk Low fall risk   Patient at Risk for Falls Due to    No Fall Risks Impaired balance/gait;Impaired mobility  Fall risk Follow up    Falls evaluation completed Falls evaluation completed   Functional Status Survey:    Vitals:   05/07/23 1015  BP: 111/60  Pulse: 75  Resp: 18  Temp: (!) 97.5 F (36.4 C)  TempSrc: Temporal  SpO2: 95%  Weight: 140 lb (63.5 kg)  Height: 5\' 1"  (1.549 m)   Body mass index is 26.45 kg/m. Physical Exam Vitals and nursing note reviewed.  Constitutional:      General: She is not in acute distress.    Appearance: She is not diaphoretic.  HENT:     Head: Normocephalic and atraumatic.     Nose: Congestion present.     Mouth/Throat:     Mouth: Mucous membranes are moist.     Pharynx: Oropharynx is clear.  Eyes:     Conjunctiva/sclera: Conjunctivae normal.     Pupils: Pupils are equal, round, and reactive to light.  Neck:     Vascular: No JVD.  Cardiovascular:     Rate and Rhythm: Normal rate and regular rhythm.     Heart sounds: No murmur heard. Pulmonary:     Effort: Pulmonary effort is normal. No respiratory distress.     Breath sounds: Normal breath sounds. No wheezing.  Abdominal:     General: Bowel sounds are normal. There is no distension.     Palpations: Abdomen is soft.     Tenderness: There is no abdominal tenderness.  Musculoskeletal:     Comments: Trace ankle  edema  Skin:    General: Skin is warm and dry.  Neurological:     General: No focal deficit present.     Mental Status: She is alert. Mental status is at baseline.  Psychiatric:        Mood and Affect: Mood normal.     Labs reviewed: No results for input(s): "NA", "K", "CL", "CO2", "GLUCOSE", "BUN", "CREATININE", "CALCIUM", "MG", "PHOS" in the last 8760 hours. No results for input(s): "AST", "ALT", "ALKPHOS", "BILITOT", "PROT", "ALBUMIN" in the last 8760 hours. No results for input(s): "WBC", "NEUTROABS", "HGB", "HCT", "MCV", "PLT" in the last 8760 hours. Lab Results  Component Value Date   TSH 3.40  05/02/2022   No results found for: "HGBA1C" Lab Results  Component Value Date   CHOL 139 06/03/2022   HDL 49 06/03/2022   LDLCALC 76 06/03/2022   TRIG 69 06/03/2022    Significant Diagnostic Results in last 30 days:  No results found.  Assessment/Plan  1. Dementia of the Alzheimer's type, with late onset, with delusions (HCC) Severe stage DNR in place Progressive decline in cognition and physical function c/w the disease. Continue supportive care in the skilled environment.   2. Localized osteoporosis without current pathological fracture Noted on prior BMD but due to  non ambulatory and dementia status no further treatment would be indicated   3. Pure hypercholesterolemia No need to further monitor lipid panel Continue Zocor   4. Slow transit constipation Continue mirlalax qod   5. Primary osteoarthritis involving multiple joints Tylenol prn   Family/ staff Communication: nurse  Labs/tests ordered:  CBC BMP

## 2023-05-11 DIAGNOSIS — R293 Abnormal posture: Secondary | ICD-10-CM | POA: Diagnosis not present

## 2023-05-11 DIAGNOSIS — G301 Alzheimer's disease with late onset: Secondary | ICD-10-CM | POA: Diagnosis not present

## 2023-05-12 DIAGNOSIS — E870 Hyperosmolality and hypernatremia: Secondary | ICD-10-CM | POA: Diagnosis not present

## 2023-05-12 LAB — BASIC METABOLIC PANEL
CO2: 25 — AB (ref 13–22)
Chloride: 108 (ref 99–108)
Creatinine: 0.8 (ref 0.5–1.1)
Glucose: 89
Potassium: 4.3 mEq/L (ref 3.5–5.1)
Sodium: 142 (ref 137–147)

## 2023-05-12 LAB — CBC AND DIFFERENTIAL
HCT: 39 (ref 36–46)
Hemoglobin: 13.2 (ref 12.0–16.0)
Platelets: 233 10*3/uL (ref 150–400)
WBC: 6.4

## 2023-05-12 LAB — CBC: RBC: 4.21 (ref 3.87–5.11)

## 2023-05-12 LAB — COMPREHENSIVE METABOLIC PANEL: Calcium: 9.5 (ref 8.7–10.7)

## 2023-06-02 ENCOUNTER — Non-Acute Institutional Stay (SKILLED_NURSING_FACILITY): Payer: Medicare Other | Admitting: Orthopedic Surgery

## 2023-06-02 ENCOUNTER — Encounter: Payer: Self-pay | Admitting: Orthopedic Surgery

## 2023-06-02 DIAGNOSIS — M159 Polyosteoarthritis, unspecified: Secondary | ICD-10-CM

## 2023-06-02 DIAGNOSIS — K5901 Slow transit constipation: Secondary | ICD-10-CM

## 2023-06-02 DIAGNOSIS — E7800 Pure hypercholesterolemia, unspecified: Secondary | ICD-10-CM | POA: Diagnosis not present

## 2023-06-02 DIAGNOSIS — F5101 Primary insomnia: Secondary | ICD-10-CM | POA: Diagnosis not present

## 2023-06-02 DIAGNOSIS — F02818 Dementia in other diseases classified elsewhere, unspecified severity, with other behavioral disturbance: Secondary | ICD-10-CM | POA: Diagnosis not present

## 2023-06-02 DIAGNOSIS — M816 Localized osteoporosis [Lequesne]: Secondary | ICD-10-CM

## 2023-06-02 DIAGNOSIS — G301 Alzheimer's disease with late onset: Secondary | ICD-10-CM

## 2023-06-02 DIAGNOSIS — H6123 Impacted cerumen, bilateral: Secondary | ICD-10-CM

## 2023-06-02 DIAGNOSIS — M150 Primary generalized (osteo)arthritis: Secondary | ICD-10-CM

## 2023-06-02 NOTE — Progress Notes (Signed)
[facility] Room Number: 141-A Place of Service:  SNF [phone]) Provider:  Hazle Nordmann, NP  PCP: Mahlon Gammon, MD  Patient Care Team: Mahlon Gammon, MD as PCP - General (Internal Medicine) Girard Cooter, MD as Consulting Physician (Dermatology)  Extended Emergency Contact Information Primary Emergency Contact: Serrao,[name] Address: [address] [place], [place] [address] [place] of [place] Home Phone: [phone] Relation: Son  Code Status: DNR   Goals of care: Advanced Directive information    06/02/2023    9:48 AM  Advanced Directives  Does Patient Have a Medical Advance Directive? Yes  Type of Estate agent of [place];Living will;Out of facility DNR (pink MOST or yellow form)  Does patient want to make changes to medical advance directive? No - Patient declined  Copy of Healthcare Power of Attorney in Chart? Yes - validated most recent copy scanned in chart (See row information)     Chief Complaint  Patient presents with   Medical Management of Chronic Issues    Routine Visit.    HPI:  Pt is a 89 y.o. female seen today for medical management of chronic diseases.    She currently resides on the skilled nursing unit at [facility]. PMH: AD, constipation, osteoporosis, OAB, HLD, and insomnia.   Dementia- No recent MMSE due to aphasia, dependent with ADLs, no behaviors, ambulates with Broda chair, PT/OT discontinued last month, remains on Namenda HLD- total 139, LDL 76, remains on simvastatin Osteoporosis- DEXA 07/2021> t score -2.8, remains on vitamin D OA- involves multiple joints, remains on tylenol prn Insomnia- remains on melatonin Constipation- remains on colace and miralax  No recent falls or injuries.   Recent blood pressures:  05/29- 118/70  05/22- 124/82  05/15- 146/71  Recent weights:  06/01- 142 lbs  05/01- 140 lbs  04/01- 137 lbs    Past Medical History:   Diagnosis Date   Alzheimer's disease (HCC)    High cholesterol    Uterine cancer (HCC)    Past Surgical History:  Procedure Laterality Date   ABDOMINAL HYSTERECTOMY  1997   [facility]   cataract surgery Bilateral     Allergies  Allergen Reactions   Penicillins Hives   Sulfa Antibiotics Hives    Allergies as of 06/02/2023       Reactions   Penicillins Hives   Sulfa Antibiotics Hives        Medication List        Accurate as of June 02, 2023  9:50 AM. If you have any questions, ask your nurse or doctor.          acetaminophen 325 MG tablet Commonly known as: TYLENOL Take 650 mg by mouth as needed for mild pain or fever.   docusate sodium 100 MG capsule Commonly known as: Colace Take 1 capsule (100 mg total) by mouth 2 (two) times daily.   ipratropium-albuterol 0.5-2.5 (3) MG/3ML Soln Commonly known as: DUONEB Take 3 mLs by nebulization 3 (three) times daily as needed.   melatonin 1 MG Tabs tablet Take by mouth at bedtime.   memantine 28 MG Cp24 24 hr capsule Commonly known as: NAMENDA XR Take 1 capsule (28 mg total) by mouth daily.   polyethylene glycol powder 17 GM/SCOOP powder Commonly known as: GLYCOLAX/MIRALAX Take 17 g by mouth every other day.   simvastatin 20 MG tablet Commonly known as: ZOCOR TAKE 1 TABLET DAILY  Vitamin D3 50 MCG (2000 UT) Tabs Take 1 tablet by mouth daily.        Review of Systems  Unable to perform ROS: Dementia    Immunization History  Administered Date(s) Administered   Fluad Quad(high Dose 65+) 10/03/2022   Influenza, High Dose Seasonal PF 10/07/2019   Influenza,inj,Quad PF,6+ Mos 10/22/2018   Influenza-Unspecified 10/31/2015, 10/23/2016, 10/19/2017, 10/23/2020, 10/02/2021, 10/03/2022   Moderna SARS-COV2 Booster Vaccination 10/09/2021, 04/24/2022, 11/03/2022, 04/24/2023   Moderna Sars-Covid-2 Vaccination 01/31/2020, 02/28/2020, 10/16/2020   Pneumococcal Conjugate-13 06/16/2018   Pneumococcal  Polysaccharide-23 02/04/2012   Tdap 10/16/2018   Zoster Recombinat (Shingrix) 07/10/2017, 11/13/2017, 06/27/2018   Pertinent  Health Maintenance Due  Topic Date Due   INFLUENZA VACCINE  07/30/2023   DEXA SCAN  Completed   Colonoscopy  Discontinued      08/24/2019    9:34 AM 10/24/2019   11:00 AM 03/08/2021    3:47 PM 08/29/2022   11:12 AM 03/02/2023   12:20 PM  Fall Risk  Falls in the past year? 0 1 0 0 0  Was there an injury with Fall? 0 1 0 0 0  Fall Risk Category Calculator 0 2 0 0 0  Fall Risk Category (Retired) Low Moderate Low Low   (RETIRED) Patient Fall Risk Level Low fall risk  Low fall risk Low fall risk   Patient at Risk for Falls Due to    No Fall Risks Impaired balance/gait;Impaired mobility  Fall risk Follow up    Falls evaluation completed Falls evaluation completed   Functional Status Survey:    Vitals:   06/02/23 0946  BP: 118/70  Pulse: 64  Resp: 18  Temp: (!) 97.2 F (36.2 C)  SpO2: 93%  Weight: 140 lb (63.5 kg)  Height: 5\' 1"  (1.549 m)   Body mass index is 26.45 kg/m. Physical Exam Vitals reviewed.  Constitutional:      General: She is not in acute distress. HENT:     Head: Normocephalic.     Right Ear: There is impacted cerumen.     Left Ear: There is impacted cerumen.     Nose: Nose normal.     Mouth/Throat:     Mouth: Mucous membranes are moist.  Eyes:     General:        Right eye: No discharge.        Left eye: No discharge.  Cardiovascular:     Rate and Rhythm: Normal rate and regular rhythm.     Pulses: Normal pulses.     Heart sounds: Normal heart sounds.  Pulmonary:     Effort: Pulmonary effort is normal. No respiratory distress.     Breath sounds: Normal breath sounds. No wheezing.  Abdominal:     General: Bowel sounds are normal. There is no distension.     Palpations: Abdomen is soft.     Tenderness: There is no abdominal tenderness.  Musculoskeletal:     Cervical back: Neck supple.     Right lower leg: No edema.      Left lower leg: No edema.  Skin:    General: Skin is warm.     Capillary Refill: Capillary refill takes less than 2 seconds.  Neurological:     General: No focal deficit present.     Mental Status: She is alert. Mental status is at baseline.     Motor: Weakness present.     Gait: Gait abnormal.     Comments: Broda chair  Psychiatric:  Mood and Affect: Mood normal.     Comments: Aphasia, follows some commands, alert to self     Labs reviewed: Recent Labs    05/12/23 0000  NA 142  K 4.3  CL 108  CO2 25*  CREATININE 0.8  CALCIUM 9.5   No results for input(s): "AST", "ALT", "ALKPHOS", "BILITOT", "PROT", "ALBUMIN" in the last 8760 hours. Recent Labs    05/12/23 0000  WBC 6.4  HGB 13.2  HCT 39  PLT 233   Lab Results  Component Value Date   TSH 3.40 05/02/2022   No results found for: "HGBA1C" Lab Results  Component Value Date   CHOL 139 06/03/2022   HDL 49 06/03/2022   LDLCALC 76 06/03/2022   TRIG 69 06/03/2022    Significant Diagnostic Results in last 30 days:  No results found.  Assessment/Plan 1. Bilateral impacted cerumen - start [facility] protocol> if tolerated  2. Dementia of the Alzheimer's type, with late onset, with delusions (HCC) - no behaviors - dependent with ADLs  - weights stable - ambulates with Broda chair - cont Namenda - cont Skilled nursing   3. Pure hypercholesterolemia - LDL stable - cont simvastatin  4. Localized osteoporosis without current pathological fracture - DEXA 07/2021> t score -2.8 - cont vitamin D  5. Primary osteoarthritis involving multiple joints - cont tylenol prn  6. Primary insomnia - stable with melatonin qhs  7. Slow transit constipation - abdomen soft - cont colace and miralax    Family/ staff Communication: plan discussed with patient and nurse  Labs/tests ordered: none

## 2023-07-20 ENCOUNTER — Non-Acute Institutional Stay (SKILLED_NURSING_FACILITY): Payer: Medicare Other | Admitting: Internal Medicine

## 2023-07-20 DIAGNOSIS — M816 Localized osteoporosis [Lequesne]: Secondary | ICD-10-CM

## 2023-07-20 DIAGNOSIS — F02818 Dementia in other diseases classified elsewhere, unspecified severity, with other behavioral disturbance: Secondary | ICD-10-CM

## 2023-07-20 DIAGNOSIS — M159 Polyosteoarthritis, unspecified: Secondary | ICD-10-CM | POA: Diagnosis not present

## 2023-07-20 DIAGNOSIS — E7800 Pure hypercholesterolemia, unspecified: Secondary | ICD-10-CM | POA: Diagnosis not present

## 2023-07-20 DIAGNOSIS — G301 Alzheimer's disease with late onset: Secondary | ICD-10-CM | POA: Diagnosis not present

## 2023-07-20 DIAGNOSIS — F5101 Primary insomnia: Secondary | ICD-10-CM | POA: Diagnosis not present

## 2023-07-20 DIAGNOSIS — K5901 Slow transit constipation: Secondary | ICD-10-CM | POA: Diagnosis not present

## 2023-07-26 ENCOUNTER — Encounter: Payer: Self-pay | Admitting: Internal Medicine

## 2023-07-26 NOTE — Progress Notes (Signed)
Location:  Medical illustrator of Service:  SNF (31)  Provider:   Code Status: DNR Goals of Care:     06/02/2023    9:48 AM  Advanced Directives  Does Patient Have a Medical Advance Directive? Yes  Type of Estate agent of [place];Living will;Out of facility DNR (pink MOST or yellow form)  Does patient want to make changes to medical advance directive? No - Patient declined  Copy of Healthcare Power of Attorney in Chart? Yes - validated most recent copy scanned in chart (See row information)     Chief Complaint  Patient presents with   Chronic Care Management    HPI: Patient is a 89 y.o. female seen today for medical management of chronic diseases.   Lives in SNF in [place]   Patient has h/o Alzheimer's Disease and HLD Non Ambulatory Hoyer lift No Behaviors No nursing issues Has aphasia Very pleasant does not follow commands Has gained weight Wt Readings from Last 3 Encounters:  07/26/23 144 lb (65.3 kg)  06/02/23 140 lb (63.5 kg)  05/07/23 140 lb (63.5 kg)     Past Medical History:  Diagnosis Date   Alzheimer's disease (HCC)    High cholesterol    Uterine cancer (HCC)     Past Surgical History:  Procedure Laterality Date   ABDOMINAL HYSTERECTOMY  1997   [facility]   cataract surgery Bilateral     Allergies  Allergen Reactions   Penicillins Hives   Sulfa Antibiotics Hives    Outpatient Encounter Medications as of 07/20/2023  Medication Sig   acetaminophen (TYLENOL) 325 MG tablet Take 650 mg by mouth as needed for mild pain or fever.   Cholecalciferol (VITAMIN D3) 2000 units TABS Take 1 tablet by mouth daily.   docusate sodium (COLACE) 100 MG capsule Take 1 capsule (100 mg total) by mouth 2 (two) times daily.   ipratropium-albuterol (DUONEB) 0.5-2.5 (3) MG/3ML SOLN Take 3 mLs by nebulization 3 (three) times daily as needed.   Melatonin 1 MG TABS Take by mouth at bedtime.   memantine (NAMENDA XR) 28 MG  CP24 24 hr capsule Take 1 capsule (28 mg total) by mouth daily.   polyethylene glycol powder (GLYCOLAX/MIRALAX) 17 GM/SCOOP powder Take 17 g by mouth every other day.   simvastatin (ZOCOR) 20 MG tablet TAKE 1 TABLET DAILY   No facility-administered encounter medications on file as of 07/20/2023.    Review of Systems:  Review of Systems  Unable to perform ROS: Dementia    Health Maintenance  Topic Date Due   COVID-19 Vaccine (8 - 2023-24 season) 06/19/2023   Medicare Annual Wellness (AWV)  04/29/2024 (Originally 03/08/2022)   INFLUENZA VACCINE  07/30/2023   DTaP/Tdap/Td (2 - Td or Tdap) 10/16/2028   Pneumonia Vaccine 49+ Years old  Completed   DEXA SCAN  Completed   Zoster Vaccines- Shingrix  Completed   HPV VACCINES  Aged Out   Colonoscopy  Discontinued    Physical Exam: Vitals:   07/26/23 1349  BP: 124/70  Pulse: 61  Resp: 17  Temp: (!) 97.5 F (36.4 C)  Weight: 144 lb (65.3 kg)   Body mass index is 27.21 kg/m. Physical Exam Vitals reviewed.  Constitutional:      Appearance: Normal appearance.  HENT:     Head: Normocephalic.     Nose: Nose normal.     Mouth/Throat:     Mouth: Mucous membranes are moist.     Pharynx: Oropharynx is  clear.  Eyes:     Pupils: Pupils are equal, round, and reactive to light.  Cardiovascular:     Rate and Rhythm: Normal rate and regular rhythm.     Pulses: Normal pulses.     Heart sounds: Normal heart sounds. No murmur heard. Pulmonary:     Effort: Pulmonary effort is normal.     Breath sounds: Normal breath sounds.  Abdominal:     General: Abdomen is flat. Bowel sounds are normal.     Palpations: Abdomen is soft.  Musculoskeletal:        General: No swelling.     Cervical back: Neck supple.  Skin:    General: Skin is warm.  Neurological:     General: No focal deficit present.     Mental Status: She is alert.  Psychiatric:        Mood and Affect: Mood normal.        Thought Content: Thought content normal.     Labs  reviewed: Basic Metabolic Panel: Recent Labs    05/12/23 0000  NA 142  K 4.3  CL 108  CO2 25*  CREATININE 0.8  CALCIUM 9.5   Liver Function Tests: No results for input(s): "AST", "ALT", "ALKPHOS", "BILITOT", "PROT", "ALBUMIN" in the last 8760 hours. No results for input(s): "LIPASE", "AMYLASE" in the last 8760 hours. No results for input(s): "AMMONIA" in the last 8760 hours. CBC: Recent Labs    05/12/23 0000  WBC 6.4  HGB 13.2  HCT 39  PLT 233   Lipid Panel: No results for input(s): "CHOL", "HDL", "LDLCALC", "TRIG", "CHOLHDL", "LDLDIRECT" in the last 8760 hours. No results found for: "HGBA1C"  Procedures since last visit: No results found.  Assessment/Plan 1. Dementia of the Alzheimer's type,  Goals are comfort ON Namenda   2. Pure hypercholesterolemia Continue statin  3. Localized osteoporosis without current pathological fracture BMD 08/22/21 T score -2.8 Decided not to pursue treatment due to her swallowing issues and Goals of care She is Non Ambulatory  4. Primary osteoarthritis involving multiple joints Tylenol  5. Primary insomnia Melatonin  6. Slow transit constipation Colace and Miralax    Labs/tests ordered:  * No order type specified * Next appt:  Visit date not found

## 2023-08-20 ENCOUNTER — Non-Acute Institutional Stay (SKILLED_NURSING_FACILITY): Payer: Medicare Other | Admitting: Adult Health

## 2023-08-20 ENCOUNTER — Encounter: Payer: Self-pay | Admitting: Adult Health

## 2023-08-20 DIAGNOSIS — G301 Alzheimer's disease with late onset: Secondary | ICD-10-CM

## 2023-08-20 DIAGNOSIS — F02818 Dementia in other diseases classified elsewhere, unspecified severity, with other behavioral disturbance: Secondary | ICD-10-CM | POA: Diagnosis not present

## 2023-08-20 DIAGNOSIS — M159 Polyosteoarthritis, unspecified: Secondary | ICD-10-CM

## 2023-08-20 DIAGNOSIS — K5901 Slow transit constipation: Secondary | ICD-10-CM

## 2023-08-20 DIAGNOSIS — E7800 Pure hypercholesterolemia, unspecified: Secondary | ICD-10-CM | POA: Diagnosis not present

## 2023-08-20 NOTE — Progress Notes (Signed)
Location:  Medical illustrator of Service:  SNF (31) Provider:  Tamsen Roers, MD  Patient Care Team: Mahlon Gammon, MD as PCP - General (Internal Medicine) Girard Cooter, MD as Consulting Physician (Dermatology)  Extended Emergency Contact Information Primary Emergency Contact: Bednarczyk,[name] Address: [address] [place], [place] [address] [place] of [place] Home Phone: [phone] Relation: Son  Code Status:  DNR Goals of care: Advanced Directive information    08/20/2023   12:15 PM  Advanced Directives  Does Patient Have a Medical Advance Directive? Yes  Type of Estate agent of [place];Out of facility DNR (pink MOST or yellow form);Living will  Copy of Healthcare Power of Attorney in Chart? Yes - validated most recent copy scanned in chart (See row information)  Pre-existing out of facility DNR order (yellow form or pink MOST form) Yellow form placed in chart (order not valid for inpatient use);Pink MOST form placed in chart (order not valid for inpatient use)     Chief Complaint  Patient presents with   Medical Management of Chronic Issues    HPI:  Pt is a 89 y.o. female seen today for medical management of chronic diseases.   Resides in skilled care due to severe Alz dementia. Not able to answer questions for MMSE. Intermittently does f/c and answer questions. Uses hoyer lift for transfers. Needs assistance with all ADls and is incontinent. No agitation and aggression.  Goals of care are comfort based with no hospitalizations.   HLD on zocor, husband wanting to keep for now. No s/e. Lab Results  Component Value Date   LDLCALC 76 06/03/2022   Constipation: regular BMs on colace and miralax  Weight stable No issues with coughing or choking, currently on regular diet.  Using broda chair.    Past Medical History:  Diagnosis Date   Alzheimer's disease (HCC)    High  cholesterol    Uterine cancer (HCC)    Past Surgical History:  Procedure Laterality Date   ABDOMINAL HYSTERECTOMY  1997   [facility]   cataract surgery Bilateral     Allergies  Allergen Reactions   Penicillins Hives   Sulfa Antibiotics Hives    Outpatient Encounter Medications as of 08/20/2023  Medication Sig   acetaminophen (TYLENOL) 325 MG tablet Take 650 mg by mouth as needed for mild pain or fever.   Cholecalciferol (VITAMIN D3) 2000 units TABS Take 1 tablet by mouth daily.   docusate sodium (COLACE) 100 MG capsule Take 1 capsule (100 mg total) by mouth 2 (two) times daily.   Melatonin 1 MG TABS Take by mouth at bedtime.   memantine (NAMENDA XR) 28 MG CP24 24 hr capsule Take 1 capsule (28 mg total) by mouth daily.   polyethylene glycol powder (GLYCOLAX/MIRALAX) 17 GM/SCOOP powder Take 17 g by mouth every other day.   simvastatin (ZOCOR) 20 MG tablet TAKE 1 TABLET DAILY   ipratropium-albuterol (DUONEB) 0.5-2.5 (3) MG/3ML SOLN Take 3 mLs by nebulization 3 (three) times daily as needed.   No facility-administered encounter medications on file as of 08/20/2023.    Review of Systems  Unable to perform ROS: Dementia    Immunization History  Administered Date(s) Administered   Fluad Quad(high Dose 65+) 10/03/2022   Influenza, High Dose Seasonal PF 10/07/2019   Influenza,inj,Quad PF,6+ Mos 10/22/2018   Influenza-Unspecified 10/31/2015, 10/23/2016, 10/19/2017, 10/23/2020, 10/02/2021, 10/03/2022   Moderna SARS-COV2 Booster Vaccination  10/09/2021, 04/24/2022, 11/03/2022, 04/24/2023   Moderna Sars-Covid-2 Vaccination 01/31/2020, 02/28/2020, 10/16/2020   Pneumococcal Conjugate-13 06/16/2018   Pneumococcal Polysaccharide-23 02/04/2012   Tdap 10/16/2018   Zoster Recombinant(Shingrix) 07/10/2017, 11/13/2017, 06/27/2018   Pertinent  Health Maintenance Due  Topic Date Due   INFLUENZA VACCINE  07/30/2023   DEXA SCAN  Completed   Colonoscopy  Discontinued      10/24/2019    11:00 AM 03/08/2021    3:47 PM 08/29/2022   11:12 AM 03/02/2023   12:20 PM 06/02/2023   12:34 PM  Fall Risk  Falls in the past year? 1 0 0 0   Was there an injury with Fall? 1 0 0 0 0  Fall Risk Category Calculator 2 0 0 0   Fall Risk Category (Retired) Moderate Low Low    (RETIRED) Patient Fall Risk Level  Low fall risk Low fall risk    Patient at Risk for Falls Due to   No Fall Risks Impaired balance/gait;Impaired mobility History of fall(s);Impaired balance/gait;Impaired mobility;Mental status change  Fall risk Follow up   Falls evaluation completed Falls evaluation completed Falls evaluation completed;Education provided;Falls prevention discussed   Functional Status Survey:    Vitals:   08/20/23 1214  BP: 121/66  Pulse: 68  Resp: 18  Temp: (!) 97.2 F (36.2 C)  SpO2: 95%  Weight: 144 lb (65.3 kg)   Body mass index is 27.21 kg/m. Physical Exam Vitals and nursing note reviewed.  Constitutional:      General: She is not in acute distress.    Appearance: She is not diaphoretic.  HENT:     Head: Normocephalic and atraumatic.     Nose: No congestion.     Mouth/Throat:     Mouth: Mucous membranes are moist.     Pharynx: Oropharynx is clear.  Eyes:     Conjunctiva/sclera: Conjunctivae normal.     Pupils: Pupils are equal, round, and reactive to light.  Neck:     Vascular: No JVD.  Cardiovascular:     Rate and Rhythm: Normal rate and regular rhythm.     Heart sounds: No murmur heard. Pulmonary:     Effort: Pulmonary effort is normal. No respiratory distress.     Breath sounds: Normal breath sounds. No wheezing.  Abdominal:     General: Bowel sounds are normal. There is no distension.     Palpations: Abdomen is soft.     Tenderness: There is no abdominal tenderness.  Musculoskeletal:     Comments: Trace ankle edema  Skin:    General: Skin is warm and dry.  Neurological:     General: No focal deficit present.     Mental Status: She is alert. Mental status is at  baseline.  Psychiatric:        Mood and Affect: Mood normal.     Labs reviewed: Recent Labs    05/12/23 0000  NA 142  K 4.3  CL 108  CO2 25*  CREATININE 0.8  CALCIUM 9.5   No results for input(s): "AST", "ALT", "ALKPHOS", "BILITOT", "PROT", "ALBUMIN" in the last 8760 hours. Recent Labs    05/12/23 0000  WBC 6.4  HGB 13.2  HCT 39  PLT 233   Lab Results  Component Value Date   TSH 3.40 05/02/2022   No results found for: "HGBA1C" Lab Results  Component Value Date   CHOL 139 06/03/2022   HDL 49 06/03/2022   LDLCALC 76 06/03/2022   TRIG 69 06/03/2022    Significant Diagnostic  Results in last 30 days:  No results found.  Assessment/Plan  1. Dementia of the Alzheimer's type, with late onset, with delusions (HCC)  Progressive decline in cognition and physical function c/w the disease. Continue supportive care in the skilled environment.   2. Pure hypercholesterolemia Continue Zocor   3. Slow transit constipation Continue mirlalax qod   4. Primary osteoarthritis involving multiple joints Tylenol prn     Family/ staff Communication: nurse  Labs/tests ordered:  NA

## 2023-09-17 ENCOUNTER — Non-Acute Institutional Stay (SKILLED_NURSING_FACILITY): Payer: Medicare Other | Admitting: Adult Health

## 2023-09-17 ENCOUNTER — Encounter: Payer: Self-pay | Admitting: Adult Health

## 2023-09-17 DIAGNOSIS — S91109A Unspecified open wound of unspecified toe(s) without damage to nail, initial encounter: Secondary | ICD-10-CM | POA: Diagnosis not present

## 2023-09-17 DIAGNOSIS — K5901 Slow transit constipation: Secondary | ICD-10-CM | POA: Diagnosis not present

## 2023-09-17 DIAGNOSIS — E7800 Pure hypercholesterolemia, unspecified: Secondary | ICD-10-CM

## 2023-09-17 DIAGNOSIS — F02818 Dementia in other diseases classified elsewhere, unspecified severity, with other behavioral disturbance: Secondary | ICD-10-CM

## 2023-09-17 DIAGNOSIS — G301 Alzheimer's disease with late onset: Secondary | ICD-10-CM

## 2023-09-17 NOTE — Progress Notes (Signed)
Location:  [facility] Room Number: 141A Place of Service:  SNF [phone]) Provider:  Tamsen Roers, MD   Extended Emergency Contact Information Primary Emergency ContactAubrianna, Ishaq Address: [address] [place], [place] [address] [place] of [place] Home Phone: [phone] Relation: Son  Code Status:  DNR Goals of care: Advanced Directive information    09/17/2023    4:57 PM  Advanced Directives  Does Patient Have a Medical Advance Directive? Yes  Type of Estate agent of [place];Out of facility DNR (pink MOST or yellow form);Living will  Does patient want to make changes to medical advance directive? No - Patient declined  Copy of Healthcare Power of Attorney in Chart? Yes - validated most recent copy scanned in chart (See row information)  Pre-existing out of facility DNR order (yellow form or pink MOST form) Yellow form placed in chart (order not valid for inpatient use);Pink MOST form placed in chart (order not valid for inpatient use)     Chief Complaint  Patient presents with   Medical Management of Chronic Issues    Patient is being seen for a routine    Immunizations    Patient is due for a for flu vaccine     HPI:  Pt is a 89 y.o. female seen today for medical management of chronic diseases.    Resides in skilled care due to severe Alz dementia. Not able to answer questions for MMSE. Intermittently does f/c and answer questions. Uses hoyer lift for transfers. Needs assistance with all ADls and is incontinent. No agitation and aggression.  Goals of care are comfort based with no hospitalizations.  Wound to 3rd toe, see plan.    HLD on zocor, husband wanting to keep for now. No s/e. Lab Results  Component Value Date   LDLCALC 76 06/03/2022   Constipation: regular BMs on colace and miralax  Weight stable No issues with coughing or choking, currently on regular  diet.  Using broda chair.  Past Medical History:  Diagnosis Date   Alzheimer's disease (HCC)    High cholesterol    [facility]    Past Surgical History:  Procedure Laterality Date   ABDOMINAL HYSTERECTOMY  1997   [facility]   cataract surgery Bilateral     Allergies  Allergen Reactions   Penicillins Hives   Sulfa Antibiotics Hives    Outpatient Encounter Medications as of 09/17/2023  Medication Sig   Cholecalciferol (VITAMIN D3) 2000 units TABS Take 1 tablet by mouth daily.   docusate sodium (COLACE) 100 MG capsule Take 1 capsule (100 mg total) by mouth 2 (two) times daily.   Melatonin 1 MG TABS Take by mouth at bedtime.   memantine (NAMENDA XR) 28 MG CP24 24 hr capsule Take 1 capsule (28 mg total) by mouth daily.   polyethylene glycol powder (GLYCOLAX/MIRALAX) 17 GM/SCOOP powder Take 17 g by mouth every other day.   simvastatin (ZOCOR) 20 MG tablet TAKE 1 TABLET DAILY   [DISCONTINUED] SPIKEVAX syringe Inject 0.5 mLs into the muscle once.   [DISCONTINUED] acetaminophen (TYLENOL) 325 MG tablet Take 650 mg by mouth as needed for mild pain or fever. (Patient not taking: Reported on 09/17/2023)   [DISCONTINUED] ipratropium-albuterol (DUONEB) 0.5-2.5 (3) MG/3ML SOLN Take 3 mLs by nebulization 3 (three) times daily as needed. (Patient not taking: Reported on 09/17/2023)   No facility-administered encounter medications on file as of  09/17/2023.    Review of Systems  Unable to perform ROS: Dementia    Immunization History  Administered Date(s) Administered   Fluad Quad(high Dose 65+) 10/03/2022   Influenza, High Dose Seasonal PF 10/07/2019   Influenza,inj,Quad PF,6+ Mos 10/22/2018   Influenza-Unspecified 10/31/2015, 10/23/2016, 10/19/2017, 10/23/2020, 10/02/2021, 10/03/2022   Moderna SARS-COV2 Booster Vaccination 10/09/2021, 04/24/2022, 11/03/2022, 04/24/2023   Moderna Sars-Covid-2 Vaccination 01/31/2020, 02/28/2020, 10/16/2020   Pfizer Covid-19 Vaccine Bivalent  Booster 53yrs & up 04/24/2023   Pneumococcal Conjugate-13 06/16/2018   Pneumococcal Polysaccharide-23 02/04/2012   Tdap 10/16/2018   Zoster Recombinant(Shingrix) 07/10/2017, 11/13/2017, 06/27/2018   Pertinent  Health Maintenance Due  Topic Date Due   INFLUENZA VACCINE  07/30/2023   DEXA SCAN  Completed   Colonoscopy  Discontinued      03/08/2021    3:47 PM 08/29/2022   11:12 AM 03/02/2023   12:20 PM 06/02/2023   12:34 PM 09/18/2023    8:35 AM  Fall Risk  Falls in the past year? 0 0 0  0  Was there an injury with Fall? 0 0 0 0 0  Fall Risk Category Calculator 0 0 0  0  Fall Risk Category (Retired) Low Low     (RETIRED) Patient Fall Risk Level Low fall risk Low fall risk     Patient at Risk for Falls Due to  No Fall Risks Impaired balance/gait;Impaired mobility History of fall(s);Impaired balance/gait;Impaired mobility;Mental status change   Fall risk Follow up  Falls evaluation completed Falls evaluation completed Falls evaluation completed;Education provided;Falls prevention discussed Falls evaluation completed   Functional Status Survey:    Vitals:   09/17/23 1655  BP: 111/69  Pulse: 69  Resp: 16  Temp: (!) 97 F (36.1 C)  TempSrc: Temporal  SpO2: 96%  Weight: 144 lb (65.3 kg)  Height: 5\' 1"  (1.549 m)   Body mass index is 27.21 kg/m. Wt Readings from Last 3 Encounters:  09/17/23 144 lb (65.3 kg)  08/20/23 144 lb (65.3 kg)  07/26/23 144 lb (65.3 kg)    Physical Exam Vitals and nursing note reviewed.  Constitutional:      General: She is not in acute distress.    Appearance: She is not diaphoretic.  HENT:     Head: Normocephalic and atraumatic.  Neck:     Vascular: No JVD.  Cardiovascular:     Rate and Rhythm: Normal rate and regular rhythm.     Heart sounds: No murmur heard. Pulmonary:     Effort: Pulmonary effort is normal. No respiratory distress.     Breath sounds: Normal breath sounds. No wheezing.  Abdominal:     General: Bowel sounds are normal.      Palpations: Abdomen is soft.  Musculoskeletal:     Right lower leg: No edema.     Left lower leg: No edema.  Skin:    General: Skin is warm and dry.     Comments: Left 3rd toe with serosang. Drainage, toenail removed.   Neurological:     General: No focal deficit present.     Mental Status: She is alert. Mental status is at baseline.  Psychiatric:        Mood and Affect: Mood normal.     Labs reviewed: Recent Labs    05/12/23 0000  NA 142  K 4.3  CL 108  CO2 25*  CREATININE 0.8  CALCIUM 9.5   No results for input(s): "AST", "ALT", "ALKPHOS", "BILITOT", "PROT", "ALBUMIN" in the last 8760 hours. Recent Labs  05/12/23 0000  WBC 6.4  HGB 13.2  HCT 39  PLT 233   Lab Results  Component Value Date   TSH 3.40 05/02/2022   No results found for: "HGBA1C" Lab Results  Component Value Date   CHOL 139 06/03/2022   HDL 49 06/03/2022   LDLCALC 76 06/03/2022   TRIG 69 06/03/2022    Significant Diagnostic Results in last 30 days:  No results found.  Assessment/Plan  1. Open wound of toe, initial encounter Apply triple antibiotic ointment and bandaid daily for 7 days or until healed.   2. Dementia of the Alzheimer's type, with late onset, with delusions (HCC) Progressive decline in cognition and physical function c/w the disease. Continue supportive care in the skilled environment. Continue Namenda.   3. Slow transit constipation Continue miralax and colace  4. Pure hypercholesterolemia Continue zocor   Family/ staff Communication: nurse  Labs/tests ordered:  NA

## 2023-09-18 ENCOUNTER — Encounter: Payer: Self-pay | Admitting: Adult Health

## 2023-10-05 ENCOUNTER — Non-Acute Institutional Stay (SKILLED_NURSING_FACILITY): Payer: Medicare Other | Admitting: Internal Medicine

## 2023-10-05 ENCOUNTER — Encounter: Payer: Self-pay | Admitting: Internal Medicine

## 2023-10-05 DIAGNOSIS — M816 Localized osteoporosis [Lequesne]: Secondary | ICD-10-CM

## 2023-10-05 DIAGNOSIS — G301 Alzheimer's disease with late onset: Secondary | ICD-10-CM

## 2023-10-05 DIAGNOSIS — E7800 Pure hypercholesterolemia, unspecified: Secondary | ICD-10-CM

## 2023-10-05 DIAGNOSIS — K5901 Slow transit constipation: Secondary | ICD-10-CM | POA: Diagnosis not present

## 2023-10-05 DIAGNOSIS — M150 Primary generalized (osteo)arthritis: Secondary | ICD-10-CM

## 2023-10-05 DIAGNOSIS — F5101 Primary insomnia: Secondary | ICD-10-CM

## 2023-10-05 DIAGNOSIS — F02818 Dementia in other diseases classified elsewhere, unspecified severity, with other behavioral disturbance: Secondary | ICD-10-CM

## 2023-10-05 NOTE — Progress Notes (Unsigned)
[facility] Room Number: 141-A Place of Service:  SNF (31) Provider:  Merian Capron    Patient Care Team: Mahlon Gammon, MD as PCP - General (Internal Medicine) Girard Cooter, MD as Consulting Physician (Dermatology)  Extended Emergency Contact Information Primary Emergency Contact: Hottenstein,[name] Address: [address] [place], [place] [address] [place] of [place] Home Phone: [phone] Relation: Son  Code Status:  DNR Goals of care: Advanced Directive information    10/05/2023   12:42 PM  Advanced Directives  Does Patient Have a Medical Advance Directive? Yes  Type of Estate agent of [place];Living will;Out of facility DNR (pink MOST or yellow form)  Does patient want to make changes to medical advance directive? No - Patient declined  Copy of Healthcare Power of Attorney in Chart? Yes - validated most recent copy scanned in chart (See row information)     Chief Complaint  Patient presents with   Medical Management of Chronic Issues    Routine Visit.    Immunizations    Discuss the need for Influenza vaccine, and Covid Booster.    HPI:  Pt is a 89 y.o. female seen today for medical management of chronic diseases.   Lives in SNF in [facility]   Patient has h/o Alzheimer's Disease and HLD Non Ambulatory Hoyer Lift  No Behaviors No New Nursing issues Has Aphasia Does not follow any commands Wt Readings from Last 3 Encounters:  10/05/23 149 lb (67.6 kg)  09/17/23 144 lb (65.3 kg)  08/20/23 144 lb (65.3 kg)    Has Gained more weight  Past Medical History:  Diagnosis Date   Alzheimer's disease (HCC)    High cholesterol    Uterine cancer (HCC)    Past Surgical History:  Procedure Laterality Date   ABDOMINAL HYSTERECTOMY  1997   [facility]   cataract surgery Bilateral     Allergies  Allergen Reactions   Penicillins Hives   Sulfa Antibiotics Hives     Allergies as of 10/05/2023       Reactions   Penicillins Hives   Sulfa Antibiotics Hives        Medication List        Accurate as of October 05, 2023 12:43 PM. If you have any questions, ask your nurse or doctor.          docusate sodium 100 MG capsule Commonly known as: Colace Take 1 capsule (100 mg total) by mouth 2 (two) times daily.   melatonin 1 MG Tabs tablet Take by mouth at bedtime.   memantine 28 MG Cp24 24 hr capsule Commonly known as: NAMENDA XR Take 1 capsule (28 mg total) by mouth daily.   polyethylene glycol powder 17 GM/SCOOP powder Commonly known as: GLYCOLAX/MIRALAX Take 17 g by mouth every other day.   simvastatin 20 MG tablet Commonly known as: ZOCOR TAKE 1 TABLET DAILY   Vitamin D3 50 MCG (2000 UT) Tabs Take 1 tablet by mouth daily.        Review of Systems  Unable to perform ROS: Dementia    Immunization History  Administered Date(s) Administered   Fluad Quad(high Dose 65+) 10/03/2022   Influenza, High Dose Seasonal PF 10/07/2019   Influenza,inj,Quad PF,6+ Mos 10/22/2018   Influenza-Unspecified 10/31/2015, 10/23/2016, 10/19/2017, 10/23/2020, 10/02/2021, 10/03/2022   Moderna SARS-COV2 Booster Vaccination 10/09/2021, 04/24/2022, 11/03/2022, 04/24/2023   Moderna Sars-Covid-2 Vaccination 01/31/2020, 02/28/2020, 10/16/2020  [employer], [employer] 40yrs & up 04/24/2023   Pneumococcal Conjugate-13 06/16/2018   Pneumococcal Polysaccharide-23 02/04/2012   Tdap 10/16/2018   Zoster Recombinant(Shingrix) 07/10/2017, 11/13/2017, 06/27/2018   Pertinent  Health Maintenance Due  Topic Date Due   INFLUENZA VACCINE  07/30/2023   DEXA SCAN  Completed   Colonoscopy  Discontinued      03/08/2021    3:47 PM 08/29/2022   11:12 AM 03/02/2023   12:20 PM 06/02/2023   12:34 PM 09/18/2023    8:35 AM  Fall Risk  Falls in the past year? 0 0 0  0  Was there an injury with Fall? 0 0 0 0 0  Fall Risk Category Calculator 0 0 0  0   Fall Risk Category (Retired) Low Low     (RETIRED) Patient Fall Risk Level Low fall risk Low fall risk     Patient at Risk for Falls Due to  No Fall Risks Impaired balance/gait;Impaired mobility History of fall(s);Impaired balance/gait;Impaired mobility;Mental status change   Fall risk Follow up  Falls evaluation completed Falls evaluation completed Falls evaluation completed;Education provided;Falls prevention discussed Falls evaluation completed   Functional Status Survey:    Vitals:   10/05/23 1241  BP: (!) 148/85  Pulse: 71  Resp: 16  Temp: (!) 97.2 F (36.2 C)  SpO2: 94%  Weight: 149 lb (67.6 kg)  Height: 5\' 1"  (1.549 m)   Body mass index is 28.15 kg/m. Physical Exam Vitals reviewed.  Constitutional:      Appearance: Normal appearance.  HENT:     Head: Normocephalic.     Nose: Nose normal.     Mouth/Throat:     Mouth: Mucous membranes are moist.     Pharynx: Oropharynx is clear.  Eyes:     Pupils: Pupils are equal, round, and reactive to light.  Cardiovascular:     Rate and Rhythm: Normal rate and regular rhythm.     Pulses: Normal pulses.     Heart sounds: Normal heart sounds. No murmur heard. Pulmonary:     Effort: Pulmonary effort is normal.     Breath sounds: Normal breath sounds.  Abdominal:     General: Abdomen is flat. Bowel sounds are normal.     Palpations: Abdomen is soft.  Musculoskeletal:        General: No swelling.     Cervical back: Neck supple.  Skin:    General: Skin is warm.  Neurological:     General: No focal deficit present.     Mental Status: She is alert.  Psychiatric:        Mood and Affect: Mood normal.        Thought Content: Thought content normal.     Labs reviewed: Recent Labs    05/12/23 0000  NA 142  K 4.3  CL 108  CO2 25*  CREATININE 0.8  CALCIUM 9.5   No results for input(s): "AST", "ALT", "ALKPHOS", "BILITOT", "PROT", "ALBUMIN" in the last 8760 hours. Recent Labs    05/12/23 0000  WBC 6.4  HGB 13.2  HCT  39  PLT 233   Lab Results  Component Value Date   TSH 3.40 05/02/2022   No results found for: "HGBA1C" Lab Results  Component Value Date   CHOL 139 06/03/2022   HDL 49 06/03/2022   LDLCALC 76 06/03/2022   TRIG 69 06/03/2022    Significant Diagnostic Results in last 30 days:  No results found.  Assessment/Plan 1. Dementia of the Alzheimer's type,  with late onset, with delusions (HCC) On Namenda Goals are Comfort  2. Pure hypercholesterolemia Continue Statin Possible Discontinue Statin due to goals of care 3. Localized osteoporosis without current pathological fracture BMD 08/22/21 T score -2.8 Decided not to pursue treatment due to her swallowing issues and Goals of care She is Non Ambulatory On Vit D 4. Primary insomnia Melatonin  5. Primary osteoarthritis involving multiple joints Tylenol  6. Slow transit constipation Colace and Miralax    Family/ staff Communication:   Labs/tests ordered:

## 2023-11-05 ENCOUNTER — Non-Acute Institutional Stay (SKILLED_NURSING_FACILITY): Payer: Medicare Other | Admitting: Adult Health

## 2023-11-05 ENCOUNTER — Encounter: Payer: Self-pay | Admitting: Adult Health

## 2023-11-05 DIAGNOSIS — K5901 Slow transit constipation: Secondary | ICD-10-CM

## 2023-11-05 DIAGNOSIS — G301 Alzheimer's disease with late onset: Secondary | ICD-10-CM

## 2023-11-05 DIAGNOSIS — I10 Essential (primary) hypertension: Secondary | ICD-10-CM

## 2023-11-05 DIAGNOSIS — M816 Localized osteoporosis [Lequesne]: Secondary | ICD-10-CM

## 2023-11-05 DIAGNOSIS — E7800 Pure hypercholesterolemia, unspecified: Secondary | ICD-10-CM

## 2023-11-05 DIAGNOSIS — F02818 Dementia in other diseases classified elsewhere, unspecified severity, with other behavioral disturbance: Secondary | ICD-10-CM

## 2023-11-05 NOTE — Progress Notes (Signed)
Location:  [facility] Service:  SNF (31) Provider:  Tamsen Roers, MD   Extended Emergency Contact Information Primary Emergency ContactSanvika, Cuttino Address: [address] [place], [place] [address] [place] of [place] Home Phone: [phone] Relation: Son  Code Status:  DNR Goals of care: Advanced Directive information    10/05/2023   12:42 PM  Advanced Directives  Does Patient Have a Medical Advance Directive? Yes  Type of Estate agent of [place];Living will;Out of facility DNR (pink MOST or yellow form)  Does patient want to make changes to medical advance directive? No - Patient declined  Copy of Healthcare Power of Attorney in Chart? Yes - validated most recent copy scanned in chart (See row information)     Chief Complaint  Patient presents with   Medical Management of Chronic Issues    HPI:  Pt is a 89 y.o. female seen today for medical management of chronic diseases.    Resides in skilled care due to severe Alz dementia. Not able to answer questions for MMSE. Hx of delusional thinking. Very pleasant. Verbal but not able to answer questions. Uses hoyer lift for transfers. Needs assistance with all ADls and is incontinent.  Goals of care are comfort based with no hospitalizations.   HLD off zocor due to goals of care.  Lab Results  Component Value Date   LDLCALC 76 06/03/2022   Constipation: regular BMs on colace and miralax  Weights reviewed  Wt Readings from Last 3 Encounters:  11/05/23 144 lb 9.6 oz (65.6 kg)  10/05/23 149 lb (67.6 kg)  09/17/23 144 lb (65.3 kg)    No issues with coughing or choking, currently on regular diet.   Bps reviewed  Blood Pressure: 122 / 82 mmHg  Blood Pressure: 167 / 84 mmHg  Blood Pressure: 151 / 63 mmHg  Blood Pressure: 140 / 86 mmHg  Past Medical History:  Diagnosis Date   Alzheimer's disease (HCC)    High  cholesterol    Uterine cancer (HCC)    Past Surgical History:  Procedure Laterality Date   ABDOMINAL HYSTERECTOMY  1997   [facility]   cataract surgery Bilateral     Allergies  Allergen Reactions   Penicillins Hives   Sulfa Antibiotics Hives    Outpatient Encounter Medications as of 11/05/2023  Medication Sig   Cholecalciferol (VITAMIN D3) 2000 units TABS Take 1 tablet by mouth daily.   docusate sodium (COLACE) 100 MG capsule Take 1 capsule (100 mg total) by mouth 2 (two) times daily.   Melatonin 1 MG TABS Take by mouth at bedtime.   memantine (NAMENDA XR) 28 MG CP24 24 hr capsule Take 1 capsule (28 mg total) by mouth daily.   polyethylene glycol powder (GLYCOLAX/MIRALAX) 17 GM/SCOOP powder Take 17 g by mouth every other day.   simvastatin (ZOCOR) 20 MG tablet TAKE 1 TABLET DAILY   No facility-administered encounter medications on file as of 11/05/2023.    Review of Systems  Unable to perform ROS: Dementia    Immunization History  Administered Date(s) Administered   Fluad Quad(high Dose 65+) 10/03/2022   Influenza, High Dose Seasonal PF 10/07/2019   Influenza,inj,Quad PF,6+ Mos 10/22/2018   Influenza-Unspecified 10/31/2015, 10/23/2016, 10/19/2017, 10/23/2020, 10/02/2021, 10/03/2022   Moderna SARS-COV2 Booster Vaccination 10/09/2021, 04/24/2022, 11/03/2022, 04/24/2023   Moderna Sars-Covid-2 Vaccination 01/31/2020, 02/28/2020, 10/16/2020   Pfizer Covid-19 Vaccine Bivalent Booster 22yrs &  up 04/24/2023   Pneumococcal Conjugate-13 06/16/2018   Pneumococcal Polysaccharide-23 02/04/2012   Tdap 10/16/2018   Zoster Recombinant(Shingrix) 07/10/2017, 11/13/2017, 06/27/2018   Pertinent  Health Maintenance Due  Topic Date Due   INFLUENZA VACCINE  07/30/2023   DEXA SCAN  Completed   Colonoscopy  Discontinued      03/08/2021    3:47 PM 08/29/2022   11:12 AM 03/02/2023   12:20 PM 06/02/2023   12:34 PM 09/18/2023    8:35 AM  Fall Risk  Falls in the past year? 0 0 0  0  Was there  an injury with Fall? 0 0 0 0 0  Fall Risk Category Calculator 0 0 0  0  Fall Risk Category (Retired) Low Low     (RETIRED) Patient Fall Risk Level Low fall risk Low fall risk     Patient at Risk for Falls Due to  No Fall Risks Impaired balance/gait;Impaired mobility History of fall(s);Impaired balance/gait;Impaired mobility;Mental status change   Fall risk Follow up  Falls evaluation completed Falls evaluation completed Falls evaluation completed;Education provided;Falls prevention discussed Falls evaluation completed   Functional Status Survey:    Vitals:   11/05/23 1510  BP: 122/82  Pulse: 74  Resp: 16  Temp: (!) 97.2 F (36.2 C)  SpO2: 93%  Weight: 144 lb 9.6 oz (65.6 kg)   Body mass index is 27.32 kg/m. Wt Readings from Last 3 Encounters:  11/05/23 144 lb 9.6 oz (65.6 kg)  10/05/23 149 lb (67.6 kg)  09/17/23 144 lb (65.3 kg)    Physical Exam Vitals and nursing note reviewed.  Constitutional:      General: She is not in acute distress.    Appearance: She is not diaphoretic.  HENT:     Head: Normocephalic and atraumatic.     Right Ear: Tympanic membrane normal.     Left Ear: Tympanic membrane normal.     Nose: Nose normal.     Mouth/Throat:     Mouth: Mucous membranes are moist.     Pharynx: Oropharynx is clear.  Neck:     Vascular: No JVD.  Cardiovascular:     Rate and Rhythm: Normal rate and regular rhythm.     Heart sounds: No murmur heard. Pulmonary:     Effort: Pulmonary effort is normal. No respiratory distress.     Breath sounds: Normal breath sounds. No wheezing.  Abdominal:     General: Bowel sounds are normal.     Palpations: Abdomen is soft.  Musculoskeletal:     Right lower leg: No edema.     Left lower leg: No edema.  Skin:    General: Skin is warm and dry.  Neurological:     General: No focal deficit present.     Mental Status: She is alert. Mental status is at baseline.  Psychiatric:        Mood and Affect: Mood normal.     Labs  reviewed: Recent Labs    05/12/23 0000  NA 142  K 4.3  CL 108  CO2 25*  CREATININE 0.8  CALCIUM 9.5   No results for input(s): "AST", "ALT", "ALKPHOS", "BILITOT", "PROT", "ALBUMIN" in the last 8760 hours. Recent Labs    05/12/23 0000  WBC 6.4  HGB 13.2  HCT 39  PLT 233   Lab Results  Component Value Date   TSH 3.40 05/02/2022   No results found for: "HGBA1C" Lab Results  Component Value Date   CHOL 139 06/03/2022   HDL 49  06/03/2022   LDLCALC 76 06/03/2022   TRIG 69 06/03/2022    Significant Diagnostic Results in last 30 days:  No results found.  Assessment/Plan  1. Dementia of the Alzheimer's type, with late onset, with delusions (HCC) Progressive decline in cognition and physical function c/w the disease. Continue supportive care in the skilled environment. Continue Namenda.   2. Slow transit constipation Continue colace and miralax   3. Pure hypercholesterolemia Off zocor due to lack of benefit.   4. Localized osteoporosis without current pathological fracture Not ambulatory so she would not benefit from additional meds and testing Remains on Vit D  5. Essential hypertension BP running slightly above goal but given her age and goals of care would not be aggressive    Family/ staff Communication: nurse  Labs/tests ordered: CBC CMP

## 2023-11-09 DIAGNOSIS — I10 Essential (primary) hypertension: Secondary | ICD-10-CM | POA: Diagnosis not present

## 2023-11-09 DIAGNOSIS — D508 Other iron deficiency anemias: Secondary | ICD-10-CM | POA: Diagnosis not present

## 2023-11-09 LAB — HEPATIC FUNCTION PANEL
ALT: 13 U/L (ref 7–35)
AST: 18 (ref 13–35)
Alkaline Phosphatase: 98 (ref 25–125)
Bilirubin, Total: 0.3

## 2023-11-09 LAB — BASIC METABOLIC PANEL
BUN: 16 (ref 4–21)
CO2: 26 — AB (ref 13–22)
Chloride: 105 (ref 99–108)
Creatinine: 0.8 (ref 0.5–1.1)
Glucose: 97
Potassium: 4.5 meq/L (ref 3.5–5.1)
Sodium: 142 (ref 137–147)

## 2023-11-09 LAB — CBC AND DIFFERENTIAL
HCT: 41 (ref 36–46)
Hemoglobin: 14 (ref 12.0–16.0)
Platelets: 243 10*3/uL (ref 150–400)
WBC: 6.3

## 2023-11-09 LAB — COMPREHENSIVE METABOLIC PANEL
Albumin: 3.9 (ref 3.5–5.0)
Calcium: 9.8 (ref 8.7–10.7)
Globulin: 2.8
eGFR: 74

## 2023-11-09 LAB — CBC: RBC: 4.39 (ref 3.87–5.11)

## 2023-12-10 ENCOUNTER — Encounter: Payer: Self-pay | Admitting: Adult Health

## 2023-12-10 ENCOUNTER — Non-Acute Institutional Stay (SKILLED_NURSING_FACILITY): Payer: Medicare Other | Admitting: Adult Health

## 2023-12-10 DIAGNOSIS — K5901 Slow transit constipation: Secondary | ICD-10-CM

## 2023-12-10 DIAGNOSIS — E7800 Pure hypercholesterolemia, unspecified: Secondary | ICD-10-CM | POA: Diagnosis not present

## 2023-12-10 DIAGNOSIS — M816 Localized osteoporosis [Lequesne]: Secondary | ICD-10-CM

## 2023-12-10 DIAGNOSIS — G301 Alzheimer's disease with late onset: Secondary | ICD-10-CM

## 2023-12-10 DIAGNOSIS — R635 Abnormal weight gain: Secondary | ICD-10-CM

## 2023-12-10 DIAGNOSIS — F5101 Primary insomnia: Secondary | ICD-10-CM | POA: Diagnosis not present

## 2023-12-10 DIAGNOSIS — F02818 Dementia in other diseases classified elsewhere, unspecified severity, with other behavioral disturbance: Secondary | ICD-10-CM

## 2023-12-10 DIAGNOSIS — I10 Essential (primary) hypertension: Secondary | ICD-10-CM

## 2023-12-10 NOTE — Progress Notes (Signed)
Location:  [facility] Service:  SNF (31) Provider:  Tamsen Roers, MD   Extended Emergency Contact Information Primary Emergency ContactLaetitia, Candela Address: [address] [place], [place] [address] [place] of [place] Home Phone: [phone] Relation: Son  Code Status:  DNR Goals of care: Advanced Directive information    10/05/2023   12:42 PM  Advanced Directives  Does Patient Have a Medical Advance Directive? Yes  Type of Estate agent of [place];Living will;Out of facility DNR (pink MOST or yellow form)  Does patient want to make changes to medical advance directive? No - Patient declined  Copy of Healthcare Power of Attorney in Chart? Yes - validated most recent copy scanned in chart (See row information)     Chief Complaint  Patient presents with   Medical Management of Chronic Issues    HPI:  Pt is a 89 y.o. female seen today for medical management of chronic diseases.    Resides in skilled care due to severe Alz dementia. Not able to answer questions for MMSE. Hx of delusional thinking. Very pleasant.  Uses hoyer lift for transfers. Needs assistance with all ADls and is incontinent.  Goals of care are comfort based with no hospitalizations.   HLD off zocor due to goals of care.  Lab Results  Component Value Date   LDLCALC 76 06/03/2022   Constipation: regular BMs on colace and miralax  Weights reviewed  Wt Readings from Last 3 Encounters:  12/10/23 147 lb 6.4 oz (66.9 kg)  11/05/23 144 lb 9.6 oz (65.6 kg)  10/05/23 149 lb (67.6 kg)    Currently on a regular diet. Did have one instance of pocketing food in matrix but no further episodes are reported. Continues to progressively gain weight over the past year.    Bps reviewed Blood Pressure: 145 / 77 mmHg  Blood Pressure: 122 / 84 mmHg  Blood Pressure: 149 / 81 mmHg  Blood Pressure: 149 / 91  mmHg  Past Medical History:  Diagnosis Date   Alzheimer's disease (HCC)    High cholesterol    Uterine cancer (HCC)    Past Surgical History:  Procedure Laterality Date   ABDOMINAL HYSTERECTOMY  1997   [facility]   cataract surgery Bilateral     Allergies  Allergen Reactions   Penicillins Hives   Sulfa Antibiotics Hives    Outpatient Encounter Medications as of 12/10/2023  Medication Sig   Cholecalciferol (VITAMIN D3) 2000 units TABS Take 1 tablet by mouth daily.   docusate sodium (COLACE) 100 MG capsule Take 1 capsule (100 mg total) by mouth 2 (two) times daily.   Melatonin 1 MG TABS Take by mouth at bedtime.   memantine (NAMENDA XR) 28 MG CP24 24 hr capsule Take 1 capsule (28 mg total) by mouth daily.   polyethylene glycol powder (GLYCOLAX/MIRALAX) 17 GM/SCOOP powder Take 17 g by mouth every other day.   No facility-administered encounter medications on file as of 12/10/2023.    Review of Systems  Unable to perform ROS: Dementia    Immunization History  Administered Date(s) Administered   Fluad Quad(high Dose 65+) 10/03/2022, 10/20/2023   Influenza, High Dose Seasonal PF 10/07/2019   Influenza,inj,Quad PF,6+ Mos 10/22/2018   Influenza-Unspecified 10/31/2015, 10/23/2016, 10/19/2017, 10/23/2020, 10/02/2021, 10/03/2022   Moderna SARS-COV2 Booster Vaccination 10/09/2021, 04/24/2022, 11/03/2022, 04/24/2023   Moderna Sars-Covid-2 Vaccination 01/31/2020, 02/28/2020, 10/16/2020   Pfizer  Covid-19 Vaccine Bivalent Booster 36yrs & up 04/24/2023   Pneumococcal Conjugate-13 06/16/2018   Pneumococcal Polysaccharide-23 02/04/2012   Tdap 10/16/2018   Zoster Recombinant(Shingrix) 07/10/2017, 11/13/2017, 06/27/2018   Pertinent  Health Maintenance Due  Topic Date Due   INFLUENZA VACCINE  Completed   DEXA SCAN  Completed   Colonoscopy  Discontinued      03/08/2021    3:47 PM 08/29/2022   11:12 AM 03/02/2023   12:20 PM 06/02/2023   12:34 PM 09/18/2023    8:35 AM  Fall Risk   Falls in the past year? 0 0 0  0  Was there an injury with Fall? 0 0 0 0 0  Fall Risk Category Calculator 0 0 0  0  Fall Risk Category (Retired) Low Low     (RETIRED) Patient Fall Risk Level Low fall risk Low fall risk     Patient at Risk for Falls Due to  No Fall Risks Impaired balance/gait;Impaired mobility History of fall(s);Impaired balance/gait;Impaired mobility;Mental status change   Fall risk Follow up  Falls evaluation completed Falls evaluation completed Falls evaluation completed;Education provided;Falls prevention discussed Falls evaluation completed   Functional Status Survey:    Vitals:   12/10/23 1553  BP: (!) 145/77  Pulse: 64  Resp: 16  Temp: (!) 97 F (36.1 C)  SpO2: 96%  Weight: 147 lb 6.4 oz (66.9 kg)   Body mass index is 27.85 kg/m. Wt Readings from Last 3 Encounters:  12/10/23 147 lb 6.4 oz (66.9 kg)  11/05/23 144 lb 9.6 oz (65.6 kg)  10/05/23 149 lb (67.6 kg)    Physical Exam Vitals and nursing note reviewed.  Constitutional:      General: She is not in acute distress.    Appearance: She is not diaphoretic.  HENT:     Head: Normocephalic and atraumatic.     Right Ear: Tympanic membrane normal.     Left Ear: Tympanic membrane normal.     Nose: Nose normal.     Mouth/Throat:     Mouth: Mucous membranes are moist.     Pharynx: Oropharynx is clear.  Neck:     Vascular: No JVD.  Cardiovascular:     Rate and Rhythm: Normal rate and regular rhythm.     Heart sounds: No murmur heard. Pulmonary:     Effort: Pulmonary effort is normal. No respiratory distress.     Breath sounds: Normal breath sounds. No wheezing.  Abdominal:     General: Bowel sounds are normal.     Palpations: Abdomen is soft.  Musculoskeletal:     Right lower leg: No edema.     Left lower leg: No edema.  Skin:    General: Skin is warm and dry.  Neurological:     General: No focal deficit present.     Mental Status: She is alert. Mental status is at baseline.  Psychiatric:         Mood and Affect: Mood normal.     Labs reviewed: Recent Labs    05/12/23 0000  NA 142  K 4.3  CL 108  CO2 25*  CREATININE 0.8  CALCIUM 9.5   No results for input(s): "AST", "ALT", "ALKPHOS", "BILITOT", "PROT", "ALBUMIN" in the last 8760 hours. Recent Labs    05/12/23 0000  WBC 6.4  HGB 13.2  HCT 39  PLT 233   Lab Results  Component Value Date   TSH 3.40 05/02/2022   No results found for: "HGBA1C" Lab Results  Component Value Date  CHOL 139 06/03/2022   HDL 49 06/03/2022   LDLCALC 76 06/03/2022   TRIG 69 06/03/2022    Significant Diagnostic Results in last 30 days:  No results found.  Assessment/Plan  1. Essential hypertension (Primary) Controlled without meds.   2. Slow transit constipation Continue miralax and senokot.   3. Dementia of the Alzheimer's type, with late onset, with delusions (HCC) Progressive decline in cognition and physical function c/w the disease. Continue supportive care in the skilled environment.  4. Localized osteoporosis without current pathological fracture Not on meds due to non ambulatory status with progressive dementia.   5. Pure hypercholesterolemia Off statin due to lack of benefit.   6. Primary insomnia Continue melatonin   7. Weight gain Continue to monitor May be due to inactivity  No signs of CHF   Family/ staff Communication: nurse  Labs/tests ordered: CBC CMP done in November. Reviewed and WNL. Need to be abstracted.

## 2023-12-18 ENCOUNTER — Encounter: Payer: Self-pay | Admitting: Adult Health

## 2023-12-18 ENCOUNTER — Non-Acute Institutional Stay (SKILLED_NURSING_FACILITY): Payer: Medicare Other | Admitting: Adult Health

## 2023-12-18 DIAGNOSIS — H04123 Dry eye syndrome of bilateral lacrimal glands: Secondary | ICD-10-CM | POA: Diagnosis not present

## 2023-12-18 MED ORDER — HYPROMELLOSE (GONIOSCOPIC) 2.5 % OP SOLN: 2.0000 [drp] | Freq: Three times a day (TID) | OPHTHALMIC | Status: AC

## 2023-12-18 NOTE — Progress Notes (Signed)
Location:  Medical illustrator of Service:  SNF (31) Provider:   Peggye Ley, ANP [facility] [phone] Gammon, MD  Patient Care Team: Mahlon Gammon, MD as PCP - General (Internal Medicine) Girard Cooter, MD as Consulting Physician (Dermatology)  Extended Emergency Contact Information Primary Emergency Contact: Kawehi, Rogan Address: [address] [place], [place] [address] [place] of [place] Home Phone: [phone] Relation: Son  Code Status:  DNR Goals of care: Advanced Directive information    10/05/2023   12:42 PM  Advanced Directives  Does Patient Have a Medical Advance Directive? Yes  Type of Estate agent of [place];Living will;Out of facility DNR (pink MOST or yellow form)  Does patient want to make changes to medical advance directive? No - Patient declined  Copy of Healthcare Power of Attorney in Chart? Yes - validated most recent copy scanned in chart (See row information)     Chief Complaint  Patient presents with   Acute Visit    Eye redness    HPI:  Pt is a 89 y.o. female seen today for an acute visit for  Eye redness  The nurse wrote an SBAR indicating that she has right eye redness and swelling. No fever, no drainage, no pain. Ms. Wimbley has dementia and is not able to provide a hx.  Past Medical History:  Diagnosis Date   Alzheimer's disease (HCC)    High cholesterol    Uterine cancer (HCC)    Past Surgical History:  Procedure Laterality Date   ABDOMINAL HYSTERECTOMY  1997   [facility]   cataract surgery Bilateral     Allergies  Allergen Reactions   Penicillins Hives   Sulfa Antibiotics Hives    Outpatient Encounter Medications as of 12/18/2023  Medication Sig   hydroxypropyl methylcellulose / hypromellose (ISOPTO TEARS / GONIOVISC) 2.5 % ophthalmic solution Place 2 drops into both eyes 3 (three) times daily for 5 days.   Cholecalciferol  (VITAMIN D3) 2000 units TABS Take 1 tablet by mouth daily.   docusate sodium (COLACE) 100 MG capsule Take 1 capsule (100 mg total) by mouth 2 (two) times daily.   Melatonin 1 MG TABS Take by mouth at bedtime.   memantine (NAMENDA XR) 28 MG CP24 24 hr capsule Take 1 capsule (28 mg total) by mouth daily.   polyethylene glycol powder (GLYCOLAX/MIRALAX) 17 GM/SCOOP powder Take 17 g by mouth every other day.   No facility-administered encounter medications on file as of 12/18/2023.    Review of Systems  Immunization History  Administered Date(s) Administered   Fluad Quad(high Dose 65+) 10/03/2022, 10/20/2023   Influenza, High Dose Seasonal PF 10/07/2019   Influenza,inj,Quad PF,6+ Mos 10/22/2018   Influenza-Unspecified 10/31/2015, 10/23/2016, 10/19/2017, 10/23/2020, 10/02/2021, 10/03/2022   Moderna SARS-COV2 Booster Vaccination 10/09/2021, 04/24/2022, 11/03/2022, 04/24/2023   Moderna Sars-Covid-2 Vaccination 01/31/2020, 02/28/2020, 10/16/2020   Pfizer Covid-19 Vaccine Bivalent Booster 49yrs & up 04/24/2023   Pneumococcal Conjugate-13 06/16/2018   Pneumococcal Polysaccharide-23 02/04/2012   Tdap 10/16/2018   Zoster Recombinant(Shingrix) 07/10/2017, 11/13/2017, 06/27/2018   Pertinent  Health Maintenance Due  Topic Date Due   INFLUENZA VACCINE  Completed   DEXA SCAN  Completed   Colonoscopy  Discontinued      03/08/2021    3:47 PM 08/29/2022   11:12 AM 03/02/2023   12:20 PM 06/02/2023   12:34 PM 09/18/2023    8:35 AM  Fall Risk  Falls in the past year? 0 0 0  0  Was there an injury with Fall? 0 0 0 0 0  Fall Risk Category Calculator 0 0 0  0  Fall Risk Category (Retired) Low Low     (RETIRED) Patient Fall Risk Level Low fall risk Low fall risk     Patient at Risk for Falls Due to  No Fall Risks Impaired balance/gait;Impaired mobility History of fall(s);Impaired balance/gait;Impaired mobility;Mental status change   Fall risk Follow up  Falls evaluation completed Falls evaluation completed  Falls evaluation completed;Education provided;Falls prevention discussed Falls evaluation completed   Functional Status Survey:    Vitals:   12/18/23 1156  BP: 114/75  Pulse: 66  Resp: 18  Temp: (!) 97.4 F (36.3 C)   There is no height or weight on file to calculate BMI. Physical Exam Vitals and nursing note reviewed.  Constitutional:      General: She is not in acute distress.    Appearance: She is not diaphoretic.  HENT:     Head: Normocephalic and atraumatic.     Nose: Congestion present.     Mouth/Throat:     Comments: Refused oral exam Eyes:     General: No scleral icterus.       Right eye: No discharge.        Left eye: No discharge.     Extraocular Movements: Extraocular movements intact.     Pupils: Pupils are equal, round, and reactive to light.     Comments: Erythema to sclera. No erythema to conjunctiva.  No purulent drainage. Mild swelling to right upper lid.   Neck:     Vascular: No JVD.  Cardiovascular:     Rate and Rhythm: Normal rate and regular rhythm.     Heart sounds: No murmur heard. Pulmonary:     Effort: Pulmonary effort is normal. No respiratory distress.     Breath sounds: Normal breath sounds. No wheezing.  Lymphadenopathy:     Cervical: No cervical adenopathy.  Skin:    General: Skin is warm and dry.  Neurological:     Mental Status: She is alert. Mental status is at baseline.     Labs reviewed: Recent Labs    05/12/23 0000  NA 142  K 4.3  CL 108  CO2 25*  CREATININE 0.8  CALCIUM 9.5   No results for input(s): "AST", "ALT", "ALKPHOS", "BILITOT", "PROT", "ALBUMIN" in the last 8760 hours. Recent Labs    05/12/23 0000  WBC 6.4  HGB 13.2  HCT 39  PLT 233   Lab Results  Component Value Date   TSH 3.40 05/02/2022   No results found for: "HGBA1C" Lab Results  Component Value Date   CHOL 139 06/03/2022   HDL 49 06/03/2022   LDLCALC 76 06/03/2022   TRIG 69 06/03/2022    Significant Diagnostic Results in last 30 days:   No results found.  Assessment/Plan 1. Dry eyes (Primary) No sign of purulent drainage at this time, improving  Monitor for s/s of infection  - hydroxypropyl methylcellulose / hypromellose (ISOPTO TEARS / GONIOVISC) 2.5 % ophthalmic solution; Place 2 drops into both eyes 3 (three) times daily for 5 days.    Family/ staff Communication: nurse  Labs/tests ordered:  NA

## 2024-01-11 ENCOUNTER — Non-Acute Institutional Stay (SKILLED_NURSING_FACILITY): Payer: Medicare Other | Admitting: Internal Medicine

## 2024-01-11 ENCOUNTER — Encounter: Payer: Self-pay | Admitting: Internal Medicine

## 2024-01-11 DIAGNOSIS — M816 Localized osteoporosis [Lequesne]: Secondary | ICD-10-CM

## 2024-01-11 DIAGNOSIS — F02818 Dementia in other diseases classified elsewhere, unspecified severity, with other behavioral disturbance: Secondary | ICD-10-CM

## 2024-01-11 DIAGNOSIS — F5101 Primary insomnia: Secondary | ICD-10-CM | POA: Diagnosis not present

## 2024-01-11 DIAGNOSIS — I10 Essential (primary) hypertension: Secondary | ICD-10-CM | POA: Diagnosis not present

## 2024-01-11 DIAGNOSIS — E7800 Pure hypercholesterolemia, unspecified: Secondary | ICD-10-CM | POA: Diagnosis not present

## 2024-01-11 DIAGNOSIS — G301 Alzheimer's disease with late onset: Secondary | ICD-10-CM

## 2024-01-11 NOTE — Progress Notes (Signed)
 Location:  [facility] Room Number: 141A Place of Service:  SNF [phone]) Provider:  Charlanne Fredia CROME, MD   Charlanne Fredia CROME, MD  Patient Care Team: Charlanne Fredia CROME, MD as PCP - General (Internal Medicine) Sheldon Lowes, MD as Consulting Physician (Dermatology)  Extended Emergency Contact Information Primary Emergency Contact: Ehrsam,[name] Address: [address] [place] [place]          [place], [place] [address] [place] Home Phone: [phone] Relation: Son  Code Status:  DNR  Goals of care: Advanced Directive information    01/11/2024    2:57 PM  Advanced Directives  Does Patient Have a Medical Advance Directive? Yes  Type of Advance Directive Living will;Out of facility DNR (pink MOST or yellow form)  Does patient want to make changes to medical advance directive? No - Patient declined  Pre-existing out of facility DNR order (yellow form or pink MOST form) Yellow form placed in chart (order not valid for inpatient use)     Chief Complaint  Patient presents with   Medical Management of Chronic Issues    Patient is being seen for routine visit     HPI:  Pt is a 89 y.o. female seen today for medical management of chronic diseases.    Lives in SNF in [place]   Patient has h/o Alzheimer's Disease and HLD She is Aphasic and Non Ambulatory Hoyer lift dependent   She is stable. No new Nursing issues. No Behavior issues Her weight is stable No Falls Wt Readings from Last 3 Encounters:  01/11/24 145 lb 3.2 oz (65.9 kg)  12/10/23 147 lb 6.4 oz (66.9 kg)  11/05/23 144 lb 9.6 oz (65.6 kg)     Past Medical History:  Diagnosis Date   Alzheimer's disease (HCC)    High cholesterol    Uterine cancer (HCC)    Past Surgical History:  Procedure Laterality Date   ABDOMINAL HYSTERECTOMY  1997   [facility]   cataract surgery Bilateral     Allergies  Allergen Reactions   Penicillins Hives   Sulfa Antibiotics Hives     Outpatient Encounter Medications as of 01/11/2024  Medication Sig   Cholecalciferol (VITAMIN D3) 2000 units TABS Take 1 tablet by mouth daily.   docusate sodium  (COLACE) 100 MG capsule Take 1 capsule (100 mg total) by mouth 2 (two) times daily.   Melatonin 1 MG TABS Take by mouth at bedtime.   memantine  (NAMENDA  XR) 28 MG CP24 24 hr capsule Take 1 capsule (28 mg total) by mouth daily.   polyethylene glycol powder (GLYCOLAX /MIRALAX ) 17 GM/SCOOP powder Take 17 g by mouth every other day.   No facility-administered encounter medications on file as of 01/11/2024.    Review of Systems  Unable to perform ROS: Dementia    Immunization History  Administered Date(s) Administered   Fluad Quad(high Dose 65+) 10/03/2022, 10/20/2023   Influenza, High Dose Seasonal PF 10/07/2019   Influenza,inj,Quad PF,6+ Mos 10/22/2018   Influenza-Unspecified 10/31/2015, 10/23/2016, 10/19/2017, 10/23/2020, 10/02/2021, 10/03/2022   Moderna SARS-COV2 Booster Vaccination 10/09/2021, 04/24/2022, 11/03/2022, 04/24/2023   Moderna Sars-Covid-2 Vaccination 01/31/2020, 02/28/2020, 10/16/2020   Pfizer Covid-19 Vaccine Bivalent Booster 96yrs & up 04/24/2023   Pneumococcal Conjugate-13 06/16/2018   Pneumococcal Polysaccharide-23 02/04/2012   Tdap 10/16/2018   Zoster Recombinant(Shingrix) 07/10/2017, 11/13/2017, 06/27/2018   Pertinent  Health Maintenance Due  Topic Date Due   INFLUENZA VACCINE  Completed   DEXA SCAN  Completed   Colonoscopy  Discontinued  08/29/2022   11:12 AM 03/02/2023   12:20 PM 06/02/2023   12:34 PM 09/18/2023    8:35 AM 01/11/2024    2:57 PM  Fall Risk  Falls in the past year? 0 0  0 0  Was there an injury with Fall? 0 0 0 0 0  Fall Risk Category Calculator 0 0  0 0  Fall Risk Category (Retired) Low      (RETIRED) Patient Fall Risk Level Low fall risk      Patient at Risk for Falls Due to No Fall Risks Impaired balance/gait;Impaired mobility History of fall(s);Impaired  balance/gait;Impaired mobility;Mental status change  No Fall Risks  Fall risk Follow up Falls evaluation completed Falls evaluation completed Falls evaluation completed;Education provided;Falls prevention discussed Falls evaluation completed Falls evaluation completed   Functional Status Survey:    Vitals:   01/11/24 1455  BP: 114/75  Pulse: 66  Resp: 18  Temp: (!) 97.4 F (36.3 C)  TempSrc: Temporal  SpO2: 95%  Weight: 145 lb 3.2 oz (65.9 kg)  Height: 5' 1 (1.549 m)   Body mass index is 27.44 kg/m. Physical Exam Vitals reviewed.  Constitutional:      Appearance: Normal appearance.  HENT:     Head: Normocephalic.     Nose: Nose normal.     Mouth/Throat:     Mouth: Mucous membranes are moist.     Pharynx: Oropharynx is clear.  Eyes:     Pupils: Pupils are equal, round, and reactive to light.  Cardiovascular:     Rate and Rhythm: Normal rate and regular rhythm.     Pulses: Normal pulses.     Heart sounds: Normal heart sounds. No murmur heard. Pulmonary:     Effort: Pulmonary effort is normal.     Breath sounds: Normal breath sounds.  Abdominal:     General: Abdomen is flat. Bowel sounds are normal.     Palpations: Abdomen is soft.  Musculoskeletal:        General: No swelling.     Cervical back: Neck supple.  Skin:    General: Skin is warm.  Neurological:     General: No focal deficit present.     Mental Status: She is alert.  Psychiatric:        Mood and Affect: Mood normal.        Thought Content: Thought content normal.     Labs reviewed: Recent Labs    05/12/23 0000 11/09/23 0000  NA 142 142  K 4.3 4.5  CL 108 105  CO2 25* 26*  BUN  --  16  CREATININE 0.8 0.8  CALCIUM 9.5 9.8   Recent Labs    11/09/23 0000  AST 18  ALT 13  ALKPHOS 98  ALBUMIN 3.9   Recent Labs    05/12/23 0000 11/09/23 0000  WBC 6.4 6.3  HGB 13.2 14.0  HCT 39 41  PLT 233 243   Lab Results  Component Value Date   TSH 3.40 05/02/2022   No results found for:  HGBA1C Lab Results  Component Value Date   CHOL 139 06/03/2022   HDL 49 06/03/2022   LDLCALC 76 06/03/2022   TRIG 69 06/03/2022    Significant Diagnostic Results in last 30 days:  No results found.  Assessment/Plan 1. Essential hypertension (Primary) Off All meds  2. Dementia of the Alzheimer's type, with late onset, with delusions (HCC) Full care On Namenda  Goals are comfort  3. Localized osteoporosis without current pathological fracture BMD 08/22/21  T score -2.8 Decided not to pursue treatment due to her swallowing issues and Goals of care She is Non Ambulatory On Vit D  4. Pure hypercholesterolemia Off statin due to Goals of care  5. Primary insomnia Melatonin    Family/ staff Communication:   Labs/tests ordered:

## 2024-02-11 ENCOUNTER — Non-Acute Institutional Stay (SKILLED_NURSING_FACILITY): Payer: Medicare Other | Admitting: Adult Health

## 2024-02-11 ENCOUNTER — Encounter: Payer: Self-pay | Admitting: Adult Health

## 2024-02-11 DIAGNOSIS — G301 Alzheimer's disease with late onset: Secondary | ICD-10-CM | POA: Diagnosis not present

## 2024-02-11 DIAGNOSIS — M816 Localized osteoporosis [Lequesne]: Secondary | ICD-10-CM | POA: Diagnosis not present

## 2024-02-11 DIAGNOSIS — F02818 Dementia in other diseases classified elsewhere, unspecified severity, with other behavioral disturbance: Secondary | ICD-10-CM

## 2024-02-11 DIAGNOSIS — F5101 Primary insomnia: Secondary | ICD-10-CM

## 2024-02-11 DIAGNOSIS — I10 Essential (primary) hypertension: Secondary | ICD-10-CM

## 2024-02-11 DIAGNOSIS — E782 Mixed hyperlipidemia: Secondary | ICD-10-CM

## 2024-02-11 DIAGNOSIS — K5901 Slow transit constipation: Secondary | ICD-10-CM | POA: Diagnosis not present

## 2024-02-11 NOTE — Progress Notes (Signed)
Location:  Medical illustrator of Service:  SNF (31) Provider:  Tamsen Roers, MD   Extended Emergency Contact Information Primary Emergency ContactShacora, Zynda Address: [address] [place], [place] [address] [place] of [place] Home Phone: [phone] Relation: Son  Code Status:  DNR Goals of care: Advanced Directive information    01/11/2024    2:57 PM  Advanced Directives  Does Patient Have a Medical Advance Directive? Yes  Type of Advance Directive Living will;Out of facility DNR (pink MOST or yellow form)  Does patient want to make changes to medical advance directive? No - Patient declined  Pre-existing out of facility DNR order (yellow form or pink MOST form) Yellow form placed in chart (order not valid for inpatient use)     Chief Complaint  Patient presents with   medical mangement    HPI:  Pt is a 89 y.o. female seen today for medical management of chronic diseases.    Resides in skilled care due to severe Alz dementia. Not able to answer questions for MMSE. Hx of delusional thinking. Very pleasant.  Uses hoyer lift for transfers. Needs assistance with all ADls and is incontinent.  Goals of care are comfort based with no hospitalizations.   HLD off zocor due to goals of care.   Constipation: regular BMs on colace and miralax  Weights reviewed  Wt Readings from Last 3 Encounters:  02/11/24 145 lb 3.2 oz (65.9 kg)  01/11/24 145 lb 3.2 oz (65.9 kg)  12/10/23 147 lb 6.4 oz (66.9 kg)    Currently on a regular diet. She had one episode of vomiting recorded in the record on 2/6 but this was self limiting and she is back to baseline   Bps reviewed   Blood Pressure: 147 / 67 mmHg  Blood Pressure: 116 / 70 mmHg   Blood Pressure: 136 / 58 mmHg  Past Medical History:  Diagnosis Date   Alzheimer's disease (HCC)    High cholesterol    Uterine cancer (HCC)    Past Surgical History:   Procedure Laterality Date   ABDOMINAL HYSTERECTOMY  1997   [facility]   cataract surgery Bilateral     Allergies  Allergen Reactions   Penicillins Hives   Sulfa Antibiotics Hives    Outpatient Encounter Medications as of 02/11/2024  Medication Sig   Cholecalciferol (VITAMIN D3) 2000 units TABS Take 1 tablet by mouth daily.   docusate sodium (COLACE) 100 MG capsule Take 1 capsule (100 mg total) by mouth 2 (two) times daily.   Melatonin 1 MG TABS Take by mouth at bedtime.   memantine (NAMENDA XR) 28 MG CP24 24 hr capsule Take 1 capsule (28 mg total) by mouth daily.   polyethylene glycol powder (GLYCOLAX/MIRALAX) 17 GM/SCOOP powder Take 17 g by mouth every other day.   No facility-administered encounter medications on file as of 02/11/2024.    Review of Systems  Unable to perform ROS: Dementia    Immunization History  Administered Date(s) Administered   Fluad Quad(high Dose 65+) 10/03/2022, 10/20/2023   Influenza, High Dose Seasonal PF 10/07/2019   Influenza,inj,Quad PF,6+ Mos 10/22/2018   Influenza-Unspecified 10/31/2015, 10/23/2016, 10/19/2017, 10/23/2020, 10/02/2021, 10/03/2022   Moderna SARS-COV2 Booster Vaccination 10/09/2021, 04/24/2022, 11/03/2022, 04/24/2023   Moderna Sars-Covid-2 Vaccination 01/31/2020, 02/28/2020, 10/16/2020   Pfizer Covid-19 Vaccine Bivalent Booster 62yrs & up 04/24/2023   Pneumococcal Conjugate-13 06/16/2018   Pneumococcal Polysaccharide-23 02/04/2012  Tdap 10/16/2018   Zoster Recombinant(Shingrix) 07/10/2017, 11/13/2017, 06/27/2018   Pertinent  Health Maintenance Due  Topic Date Due   INFLUENZA VACCINE  Completed   DEXA SCAN  Completed   Colonoscopy  Discontinued      08/29/2022   11:12 AM 03/02/2023   12:20 PM 06/02/2023   12:34 PM 09/18/2023    8:35 AM 01/11/2024    2:57 PM  Fall Risk  Falls in the past year? 0 0  0 0  Was there an injury with Fall? 0 0 0 0 0  Fall Risk Category Calculator 0 0  0 0  Fall Risk Category (Retired) Low       (RETIRED) Patient Fall Risk Level Low fall risk      Patient at Risk for Falls Due to No Fall Risks Impaired balance/gait;Impaired mobility History of fall(s);Impaired balance/gait;Impaired mobility;Mental status change  No Fall Risks  Fall risk Follow up Falls evaluation completed Falls evaluation completed Falls evaluation completed;Education provided;Falls prevention discussed Falls evaluation completed Falls evaluation completed   Functional Status Survey:    Vitals:   02/11/24 1615  BP: (!) 147/67  Pulse: 60  Resp: 16  Temp: (!) 97.2 F (36.2 C)  SpO2: 92%  Weight: 145 lb 3.2 oz (65.9 kg)    Body mass index is 27.44 kg/m. Wt Readings from Last 3 Encounters:  02/11/24 145 lb 3.2 oz (65.9 kg)  01/11/24 145 lb 3.2 oz (65.9 kg)  12/10/23 147 lb 6.4 oz (66.9 kg)    Physical Exam Vitals and nursing note reviewed.  Constitutional:      General: She is not in acute distress.    Appearance: She is not diaphoretic.  HENT:     Head: Normocephalic and atraumatic.     Right Ear: Tympanic membrane normal.     Left Ear: Tympanic membrane normal.     Nose: Nose normal.     Mouth/Throat:     Mouth: Mucous membranes are moist.     Pharynx: Oropharynx is clear.  Neck:     Vascular: No JVD.  Cardiovascular:     Rate and Rhythm: Normal rate and regular rhythm.     Heart sounds: No murmur heard. Pulmonary:     Effort: Pulmonary effort is normal. No respiratory distress.     Breath sounds: Normal breath sounds. No wheezing.  Abdominal:     General: Bowel sounds are normal.     Palpations: Abdomen is soft.  Musculoskeletal:     Right lower leg: No edema.     Left lower leg: No edema.  Skin:    General: Skin is warm and dry.  Neurological:     General: No focal deficit present.     Mental Status: She is alert. Mental status is at baseline.  Psychiatric:        Mood and Affect: Mood normal.     Labs reviewed: Recent Labs    05/12/23 0000 11/09/23 0000  NA 142 142  K  4.3 4.5  CL 108 105  CO2 25* 26*  BUN  --  16  CREATININE 0.8 0.8  CALCIUM 9.5 9.8   Recent Labs    11/09/23 0000  AST 18  ALT 13  ALKPHOS 98  ALBUMIN 3.9   Recent Labs    05/12/23 0000 11/09/23 0000  WBC 6.4 6.3  HGB 13.2 14.0  HCT 39 41  PLT 233 243   Lab Results  Component Value Date   TSH 3.40 05/02/2022  No results found for: "HGBA1C" Lab Results  Component Value Date   CHOL 139 06/03/2022   HDL 49 06/03/2022   LDLCALC 76 06/03/2022   TRIG 69 06/03/2022    Significant Diagnostic Results in last 30 days:  No results found.  Assessment/Plan  1. Essential hypertension (Primary) Controlled without meds.   2. Slow transit constipation Continue miralax and senokot.   3. Dementia of the Alzheimer's type, with late onset, with delusions (HCC) Progressive decline in cognition and physical function c/w the disease. Continue supportive care in the skilled environment.  4. Localized osteoporosis without current pathological fracture Not on meds due to non ambulatory status with progressive dementia.   5. Pure hypercholesterolemia Off statin due to lack of benefit.   6. Primary insomnia Continue melatonin       Labs/tests ordered: NA

## 2024-03-03 ENCOUNTER — Non-Acute Institutional Stay: Payer: Self-pay | Admitting: Adult Health

## 2024-03-03 ENCOUNTER — Encounter: Payer: Self-pay | Admitting: Adult Health

## 2024-03-03 DIAGNOSIS — M816 Localized osteoporosis [Lequesne]: Secondary | ICD-10-CM | POA: Diagnosis not present

## 2024-03-03 DIAGNOSIS — K5901 Slow transit constipation: Secondary | ICD-10-CM

## 2024-03-03 DIAGNOSIS — F02818 Dementia in other diseases classified elsewhere, unspecified severity, with other behavioral disturbance: Secondary | ICD-10-CM | POA: Diagnosis not present

## 2024-03-03 DIAGNOSIS — E7800 Pure hypercholesterolemia, unspecified: Secondary | ICD-10-CM

## 2024-03-03 DIAGNOSIS — G301 Alzheimer's disease with late onset: Secondary | ICD-10-CM | POA: Diagnosis not present

## 2024-03-03 NOTE — Progress Notes (Signed)
 Location:  [facility] Room Number: 141A Place of Service:  SNF [phone]) Provider:  Tamsen Roers, MD  Patient Care Team: Mahlon Gammon, MD as PCP - General (Internal Medicine) Girard Cooter, MD as Consulting Physician (Dermatology)  Extended Emergency Contact Information Primary Emergency Contact: Yazdani,[name] Address: [address] [place], [place] [address] [place] Home Phone: [phone] Relation: Son  Code Status:  DNR Goals of care: Advanced Directive information    03/03/2024    4:16 PM  Advanced Directives  Does Patient Have a Medical Advance Directive? Yes  Type of Advance Directive Living will;Out of facility DNR (pink MOST or yellow form)  Does patient want to make changes to medical advance directive? No - Patient declined  Pre-existing out of facility DNR order (yellow form or pink MOST form) Yellow form placed in chart (order not valid for inpatient use)     Chief Complaint  Patient presents with   Medical Management of Chronic Issues    Patient is being seen for routine     HPI:  Pt is a 89 y.o. female seen today for medical management of chronic diseases.    Resides in skilled care due to severe Alz dementia. Not able to answer questions for MMSE. Hx of delusional thinking. Very pleasant.  Uses hoyer lift for transfers. Needs assistance with all ADls and is incontinent.  Goals of care are comfort based with no hospitalizations.  HLD off zocor due to goals of care.   Constipation: regular BMs on colace and miralax  Weights reviewed  Wt Readings from Last 3 Encounters:  03/03/24 145 lb 3.2 oz (65.9 kg)  02/11/24 145 lb 3.2 oz (65.9 kg)  01/11/24 145 lb 3.2 oz (65.9 kg)    ON regular diet no issues swallowing reported.   Bps reviewed   Blood Pressure: 132 / 68 mmHg  Blood Pressure: 131 / 67 mmHg  Blood Pressure: 147 / 67 mmHg  OP: BMD 08/22/21 T score -2.8   Not currently on meds   Past Medical History:  Diagnosis Date   Alzheimer's disease (HCC)    High cholesterol    Uterine cancer [facility])    Past Surgical History:  Procedure Laterality Date   ABDOMINAL HYSTERECTOMY  1997   [facility]   cataract surgery Bilateral     Allergies  Allergen Reactions   Penicillins Hives   Sulfa Antibiotics Hives    Outpatient Encounter Medications as of 03/03/2024  Medication Sig   Cholecalciferol (VITAMIN D3) 2000 units TABS Take 1 tablet by mouth daily.   docusate sodium (COLACE) 100 MG capsule Take 1 capsule (100 mg total) by mouth 2 (two) times daily.   FLUAD 0.5 ML injection Inject 0.5 mLs into the muscle once.   Melatonin 1 MG TABS Take by mouth at bedtime.   memantine (NAMENDA XR) 28 MG CP24 24 hr capsule Take 1 capsule (28 mg total) by mouth daily.   polyethylene glycol powder (GLYCOLAX/MIRALAX) 17 GM/SCOOP powder Take 17 g by mouth every other day.   SPIKEVAX syringe Inject 0.5 mLs into the muscle once.   No facility-administered encounter medications on file as of 03/03/2024.    Review of Systems  Unable to perform ROS: Dementia    Immunization History  Administered Date(s) Administered   Fluad Quad(high Dose 65+) 10/03/2022, 10/20/2023   Influenza, High Dose Seasonal PF 10/07/2019   Influenza,inj,Quad PF,6+  Mos 10/22/2018   Influenza-Unspecified 10/31/2015, 10/23/2016, 10/19/2017, 10/23/2020, 10/02/2021, 10/03/2022   Moderna SARS-COV2 Booster Vaccination 10/09/2021, 04/24/2022, 11/03/2022, 04/24/2023   Moderna Sars-Covid-2 Vaccination 01/31/2020, 02/28/2020, 10/16/2020   PFIZER Comirnaty(Gray Top)Covid-19 Tri-Sucrose Vaccine 10/20/2023   Pfizer Covid-19 Vaccine Bivalent Booster 57yrs & up 04/24/2023   Pneumococcal Conjugate-13 06/16/2018   Pneumococcal Polysaccharide-23 02/04/2012   Tdap 10/16/2018   Zoster Recombinant(Shingrix) 07/10/2017, 11/13/2017, 06/27/2018   Pertinent  Health Maintenance Due  Topic Date Due    INFLUENZA VACCINE  Completed   DEXA SCAN  Completed   Colonoscopy  Discontinued      08/29/2022   11:12 AM 03/02/2023   12:20 PM 06/02/2023   12:34 PM 09/18/2023    8:35 AM 01/11/2024    2:57 PM  Fall Risk  Falls in the past year? 0 0  0 0  Was there an injury with Fall? 0 0 0 0 0  Fall Risk Category Calculator 0 0  0 0  Fall Risk Category (Retired) Low      (RETIRED) Patient Fall Risk Level Low fall risk      Patient at Risk for Falls Due to No Fall Risks Impaired balance/gait;Impaired mobility History of fall(s);Impaired balance/gait;Impaired mobility;Mental status change  No Fall Risks  Fall risk Follow up Falls evaluation completed Falls evaluation completed Falls evaluation completed;Education provided;Falls prevention discussed Falls evaluation completed Falls evaluation completed   Functional Status Survey:    Vitals:   03/03/24 1614  BP: 132/68  Pulse: 60  Resp: 16  Temp: 98.4 F (36.9 C)  TempSrc: Temporal  SpO2: 94%  Weight: 145 lb 3.2 oz (65.9 kg)  Height: 5\' 1"  (1.549 m)   Body mass index is 27.44 kg/m. Physical Exam Vitals and nursing note reviewed.  Constitutional:      General: She is not in acute distress.    Appearance: She is not diaphoretic.  HENT:     Head: Normocephalic and atraumatic.  Neck:     Vascular: No JVD.  Cardiovascular:     Rate and Rhythm: Normal rate and regular rhythm.     Heart sounds: No murmur heard. Pulmonary:     Effort: Pulmonary effort is normal. No respiratory distress.     Breath sounds: Normal breath sounds. No wheezing.  Abdominal:     General: Abdomen is flat. Bowel sounds are normal.     Palpations: Abdomen is soft.  Musculoskeletal:     Right lower leg: No edema.     Left lower leg: No edema.  Skin:    General: Skin is warm and dry.  Neurological:     General: No focal deficit present.     Mental Status: She is alert. Mental status is at baseline.  Psychiatric:        Mood and Affect: Mood normal.     Labs  reviewed: Recent Labs    05/12/23 0000 11/09/23 0000  NA 142 142  K 4.3 4.5  CL 108 105  CO2 25* 26*  BUN  --  16  CREATININE 0.8 0.8  CALCIUM 9.5 9.8   Recent Labs    11/09/23 0000  AST 18  ALT 13  ALKPHOS 98  ALBUMIN 3.9   Recent Labs    05/12/23 0000 11/09/23 0000  WBC 6.4 6.3  HGB 13.2 14.0  HCT 39 41  PLT 233 243   Lab Results  Component Value Date   TSH 3.40 05/02/2022   No results found for: "HGBA1C" Lab Results  Component Value Date  CHOL 139 06/03/2022   HDL 49 06/03/2022   LDLCALC 76 06/03/2022   TRIG 69 06/03/2022    Significant Diagnostic Results in last 30 days:  No results found.  Assessment/Plan   1. Essential hypertension (Primary) Controlled without meds.   2. Slow transit constipation Continue miralax and senokot.   3. Dementia of the Alzheimer's type, with late onset, with delusions (HCC) Progressive decline in cognition and physical function c/w the disease. Continue supportive care in the skilled environment.  4. Localized osteoporosis without current pathological fracture Not on meds due to non ambulatory status with progressive dementia.  Continue vit d  5. Pure hypercholesterolemia Off statin due to lack of benefit.   6. Primary insomnia Continue melatonin    Family/ staff Communication: nurse  Labs/tests ordered:  NA

## 2024-04-04 ENCOUNTER — Non-Acute Institutional Stay (SKILLED_NURSING_FACILITY): Payer: Self-pay | Admitting: Internal Medicine

## 2024-04-04 DIAGNOSIS — K5901 Slow transit constipation: Secondary | ICD-10-CM

## 2024-04-04 DIAGNOSIS — E782 Mixed hyperlipidemia: Secondary | ICD-10-CM | POA: Diagnosis not present

## 2024-04-04 DIAGNOSIS — F5101 Primary insomnia: Secondary | ICD-10-CM

## 2024-04-04 DIAGNOSIS — G301 Alzheimer's disease with late onset: Secondary | ICD-10-CM | POA: Diagnosis not present

## 2024-04-04 DIAGNOSIS — F02818 Dementia in other diseases classified elsewhere, unspecified severity, with other behavioral disturbance: Secondary | ICD-10-CM

## 2024-04-04 DIAGNOSIS — I10 Essential (primary) hypertension: Secondary | ICD-10-CM

## 2024-04-08 ENCOUNTER — Encounter: Payer: Self-pay | Admitting: Internal Medicine

## 2024-04-08 NOTE — Progress Notes (Signed)
 Location:  Medical illustrator of Service:  SNF (31)  Provider:   Code Status: DNR Goals of Care:     03/03/2024    4:16 PM  Advanced Directives  Does Patient Have a Medical Advance Directive? Yes  Type of Advance Directive Living will;Out of facility DNR (pink MOST or yellow form)  Does patient want to make changes to medical advance directive? No - Patient declined  Pre-existing out of facility DNR order (yellow form or pink MOST form) Yellow form placed in chart (order not valid for inpatient use)     Chief Complaint  Patient presents with   Care Management    HPI: Patient is a 89 y.o. female seen today for medical management of chronic diseases.    Lives in SNF in [place]   Patient has h/o Alzheimer's Disease and HLD She is Aphasic and Non Ambulatory Hoyer lift dependent Needs Help with Feeding   She is stable. No new Nursing issues. No Behavior issues Her weight is stable Hoyer dependent No Falls Wt Readings from Last 3 Encounters:  04/08/24 145 lb (65.8 kg)  03/03/24 145 lb 3.2 oz (65.9 kg)  02/11/24 145 lb 3.2 oz (65.9 kg)   Past Medical History:  Diagnosis Date   Alzheimer's disease (HCC)    High cholesterol    Uterine cancer (HCC)     Past Surgical History:  Procedure Laterality Date   ABDOMINAL HYSTERECTOMY  1997   [facility]   cataract surgery Bilateral     Allergies  Allergen Reactions   Penicillins Hives   Sulfa Antibiotics Hives    Outpatient Encounter Medications as of 04/04/2024  Medication Sig   Cholecalciferol (VITAMIN D3) 2000 units TABS Take 1 tablet by mouth daily.   docusate sodium (COLACE) 100 MG capsule Take 1 capsule (100 mg total) by mouth 2 (two) times daily.   Melatonin 1 MG TABS Take by mouth at bedtime.   memantine (NAMENDA XR) 28 MG CP24 24 hr capsule Take 1 capsule (28 mg total) by mouth daily.   polyethylene glycol powder (GLYCOLAX/MIRALAX) 17 GM/SCOOP powder Take 17 g by mouth every other  day.   No facility-administered encounter medications on file as of 04/04/2024.    Review of Systems:  Review of Systems  Unable to perform ROS: Dementia    Health Maintenance  Topic Date Due   COVID-19 Vaccine (9 - 2024-25 season) 12/15/2023   Medicare Annual Wellness (AWV)  04/29/2024 (Originally 03/08/2022)   INFLUENZA VACCINE  07/29/2024   DTaP/Tdap/Td (2 - Td or Tdap) 10/16/2028   Pneumonia Vaccine 38+ Years old  Completed   DEXA SCAN  Completed   Zoster Vaccines- Shingrix  Completed   HPV VACCINES  Aged Out   Meningococcal B Vaccine  Aged Out   Colonoscopy  Discontinued    Physical Exam: Vitals:   04/08/24 0851  BP: 136/84  Pulse: 63  Temp: (!) 97.2 F (36.2 C)  Weight: 145 lb (65.8 kg)   Body mass index is 27.4 kg/m. Physical Exam Vitals reviewed.  Constitutional:      Appearance: Normal appearance.  HENT:     Head: Normocephalic.     Nose: Nose normal.     Mouth/Throat:     Mouth: Mucous membranes are moist.     Pharynx: Oropharynx is clear.  Eyes:     Pupils: Pupils are equal, round, and reactive to light.  Cardiovascular:     Rate and Rhythm: Normal rate and regular  rhythm.     Pulses: Normal pulses.     Heart sounds: Normal heart sounds. No murmur heard. Pulmonary:     Effort: Pulmonary effort is normal.     Breath sounds: Normal breath sounds.  Abdominal:     General: Abdomen is flat. Bowel sounds are normal.     Palpations: Abdomen is soft.  Musculoskeletal:        General: No swelling.     Cervical back: Neck supple.  Skin:    General: Skin is warm.  Neurological:     Mental Status: She is alert.     Comments: Has aphasia  Psychiatric:        Mood and Affect: Mood normal.        Thought Content: Thought content normal.     Labs reviewed: Basic Metabolic Panel: Recent Labs    05/12/23 0000 11/09/23 0000  NA 142 142  K 4.3 4.5  CL 108 105  CO2 25* 26*  BUN  --  16  CREATININE 0.8 0.8  CALCIUM 9.5 9.8   Liver Function  Tests: Recent Labs    11/09/23 0000  AST 18  ALT 13  ALKPHOS 98  ALBUMIN 3.9   No results for input(s): "LIPASE", "AMYLASE" in the last 8760 hours. No results for input(s): "AMMONIA" in the last 8760 hours. CBC: Recent Labs    05/12/23 0000 11/09/23 0000  WBC 6.4 6.3  HGB 13.2 14.0  HCT 39 41  PLT 233 243   Lipid Panel: No results for input(s): "CHOL", "HDL", "LDLCALC", "TRIG", "CHOLHDL", "LDLDIRECT" in the last 8760 hours. No results found for: "HGBA1C"  Procedures since last visit: No results found.  Assessment/Plan 1. Late onset Alzheimer's disease with behavioral disturbance (HCC) (Primary) Namenda Goals are Comfort  2. Slow transit constipation Colace and Miralax  3. Mixed hyperlipidemia Off statin   4. Primary insomnia Melatonin  5. Essential hypertension Off All meds 6 Localized osteoporosis without current pathological fracture BMD 08/22/21 T score -2.8 Decided not to pursue treatment due to her swallowing issues and Goals of care She is Non Ambulatory On Vit D   Labs/tests ordered:  * No order type specified * Next appt:  Visit date not found

## 2024-05-05 ENCOUNTER — Non-Acute Institutional Stay (SKILLED_NURSING_FACILITY): Payer: Self-pay | Admitting: Adult Health

## 2024-05-05 ENCOUNTER — Encounter: Payer: Self-pay | Admitting: Adult Health

## 2024-05-05 DIAGNOSIS — K5901 Slow transit constipation: Secondary | ICD-10-CM | POA: Diagnosis not present

## 2024-05-05 DIAGNOSIS — G301 Alzheimer's disease with late onset: Secondary | ICD-10-CM

## 2024-05-05 DIAGNOSIS — F5101 Primary insomnia: Secondary | ICD-10-CM | POA: Diagnosis not present

## 2024-05-05 DIAGNOSIS — F02818 Dementia in other diseases classified elsewhere, unspecified severity, with other behavioral disturbance: Secondary | ICD-10-CM

## 2024-05-05 DIAGNOSIS — M816 Localized osteoporosis [Lequesne]: Secondary | ICD-10-CM | POA: Diagnosis not present

## 2024-05-05 DIAGNOSIS — E7800 Pure hypercholesterolemia, unspecified: Secondary | ICD-10-CM | POA: Diagnosis not present

## 2024-05-05 NOTE — Progress Notes (Addendum)
 Location:  Medical illustrator of Service:  SNF (31) Provider:  Doak Free, MD  Patient Care Team: Marguerite Shiley, MD as PCP - General (Internal Medicine) Woodrow Hazy, MD as Consulting Physician (Dermatology)  Extended Emergency Contact Information Primary Emergency Contact: Voong,[name] Address: [address] [place], [place] [address] [place] Home Phone: [phone] Relation: Son  Code Status:  DNR Goals of care: Advanced Directive information    03/03/2024    4:16 PM  Advanced Directives  Does Patient Have a Medical Advance Directive? Yes  Type of Advance Directive Living will;Out of facility DNR (pink MOST or yellow form)  Does patient want to make changes to medical advance directive? No - Patient declined  Pre-existing out of facility DNR order (yellow form or pink MOST form) Yellow form placed in chart (order not valid for inpatient use)     Chief Complaint  Patient presents with   Medical Management of Chronic Issues    HPI:  Pt is a 89 y.o. female seen today for medical management of chronic diseases.    Resides in skilled care due to severe Alz dementia. Not able to answer questions for MMSE.  Very pleasant.  Uses hoyer lift for transfers. Needs assistance with all ADls and is incontinent.  Goals of care are comfort based with no hospitalizations.  HLD off zocor  due to goals of care.   Constipation: regular BMs on colace and miralax   Weights reviewed down 3 lbs  Wt Readings from Last 3 Encounters:  05/05/24 142 lb 9.6 oz (64.7 kg)  04/08/24 145 lb (65.8 kg)  03/03/24 145 lb 3.2 oz (65.9 kg)    ON regular diet. No dysphagia reported however she recently changed to rinseless tooth paste to help prevent issues of aspiration   Bps reviewed Blood Pressure: 136 / 83 mmHg  Blood Pressure: 148 / 76 mmHg   Blood Pressure: 122 / 64 mmHg  Blood Pressure: 148 / 76 mmHg  OP: BMD  08/22/21 T score -2.8  Not currently on meds   Past Medical History:  Diagnosis Date   Alzheimer's disease (HCC)    High cholesterol    Uterine cancer [facility])    Past Surgical History:  Procedure Laterality Date   ABDOMINAL HYSTERECTOMY  1997   [facility]   cataract surgery Bilateral     Allergies  Allergen Reactions   Penicillins Hives   Sulfa Antibiotics Hives    Outpatient Encounter Medications as of 05/05/2024  Medication Sig   Cholecalciferol (VITAMIN D3) 2000 units TABS Take 1 tablet by mouth daily.   docusate sodium  (COLACE) 100 MG capsule Take 1 capsule (100 mg total) by mouth 2 (two) times daily.   Melatonin 1 MG TABS Take by mouth at bedtime.   memantine  (NAMENDA  XR) 28 MG CP24 24 hr capsule Take 1 capsule (28 mg total) by mouth daily.   polyethylene glycol powder (GLYCOLAX /MIRALAX ) 17 GM/SCOOP powder Take 17 g by mouth every other day.   No facility-administered encounter medications on file as of 05/05/2024.    Review of Systems  Unable to perform ROS: Dementia    Immunization History  Administered Date(s) Administered   Fluad Quad(high Dose 65+) 10/03/2022, 10/20/2023   Influenza, High Dose Seasonal PF 10/07/2019   Influenza,inj,Quad PF,6+ Mos 10/22/2018   Influenza-Unspecified 10/31/2015, 10/23/2016, 10/19/2017, 10/23/2020, 10/02/2021, 10/03/2022   Moderna SARS-COV2 Booster Vaccination 10/09/2021, 04/24/2022, 11/03/2022,  04/24/2023   Moderna Sars-Covid-2 Vaccination 01/31/2020, 02/28/2020, 10/16/2020   PFIZER Comirnaty(Gray Top)Covid-19 Tri-Sucrose Vaccine 10/20/2023   Pfizer Covid-19 Vaccine Bivalent Booster 92yrs & up 04/24/2023   Pneumococcal Conjugate-13 06/16/2018   Pneumococcal Polysaccharide-23 02/04/2012   Tdap 10/16/2018   Zoster Recombinant(Shingrix) 07/10/2017, 11/13/2017, 06/27/2018   Pertinent  Health Maintenance Due  Topic Date Due   INFLUENZA VACCINE  07/29/2024   DEXA SCAN  Completed   Colonoscopy  Discontinued      03/02/2023    12:20 PM 06/02/2023   12:34 PM 09/18/2023    8:35 AM 01/11/2024    2:57 PM 05/05/2024    2:30 PM  Fall Risk  Falls in the past year? 0  0 0 0  Was there an injury with Fall? 0 0 0 0 0  Fall Risk Category Calculator 0  0 0 0  Patient at Risk for Falls Due to Impaired balance/gait;Impaired mobility History of fall(s);Impaired balance/gait;Impaired mobility;Mental status change  No Fall Risks   Fall risk Follow up Falls evaluation completed Falls evaluation completed;Education provided;Falls prevention discussed Falls evaluation completed Falls evaluation completed Falls evaluation completed   Functional Status Survey:    Vitals:   05/05/24 1421  BP: 136/77  Pulse: 66  Resp: 12  Temp: (!) 97.2 F (36.2 C)  SpO2: 96%  Weight: 142 lb 9.6 oz (64.7 kg)   Body mass index is 26.94 kg/m. Physical Exam Vitals and nursing note reviewed.  Constitutional:      General: She is not in acute distress.    Appearance: She is not diaphoretic.  HENT:     Head: Normocephalic and atraumatic.  Neck:     Vascular: No JVD.  Cardiovascular:     Rate and Rhythm: Normal rate and regular rhythm.     Heart sounds: No murmur heard. Pulmonary:     Effort: Pulmonary effort is normal. No respiratory distress.     Breath sounds: Normal breath sounds. No wheezing.  Abdominal:     General: Abdomen is flat. Bowel sounds are normal.     Palpations: Abdomen is soft.  Musculoskeletal:     Right lower leg: No edema.     Left lower leg: No edema.  Skin:    General: Skin is warm and dry.  Neurological:     General: No focal deficit present.     Mental Status: She is alert. Mental status is at baseline.  Psychiatric:        Mood and Affect: Mood normal.     Labs reviewed: Recent Labs    05/12/23 0000 11/09/23 0000  NA 142 142  K 4.3 4.5  CL 108 105  CO2 25* 26*  BUN  --  16  CREATININE 0.8 0.8  CALCIUM 9.5 9.8   Recent Labs    11/09/23 0000  AST 18  ALT 13  ALKPHOS 98  ALBUMIN 3.9   Recent  Labs    05/12/23 0000 11/09/23 0000  WBC 6.4 6.3  HGB 13.2 14.0  HCT 39 41  PLT 233 243   Lab Results  Component Value Date   TSH 3.40 05/02/2022   No results found for: "HGBA1C" Lab Results  Component Value Date   CHOL 139 06/03/2022   HDL 49 06/03/2022   LDLCALC 76 06/03/2022   TRIG 69 06/03/2022    Significant Diagnostic Results in last 30 days:  No results found.  Assessment/Plan  1. Dementia of the Alzheimer's type, with late onset, with delusions (HCC) Progressive decline in  cognition and physical function c/w the disease. Continue supportive care in the skilled environment. On namenda    2. Localized osteoporosis without current pathological fracture Not on meds due to non ambulatory status with progressive dementia.  Continue vit d  3. Pure hypercholesterolemia Off statin due to lack of benefit.   4. Primary insomnia Continue melatonin   5. Slow transit constipation Continue miralax  and senokot.   Family/ staff Communication: nurse  Labs/tests ordered:  NA

## 2024-06-02 ENCOUNTER — Encounter: Payer: Self-pay | Admitting: Adult Health

## 2024-06-02 ENCOUNTER — Non-Acute Institutional Stay (SKILLED_NURSING_FACILITY): Payer: Self-pay | Admitting: Adult Health

## 2024-06-02 DIAGNOSIS — L304 Erythema intertrigo: Secondary | ICD-10-CM

## 2024-06-02 DIAGNOSIS — M150 Primary generalized (osteo)arthritis: Secondary | ICD-10-CM

## 2024-06-02 DIAGNOSIS — F5101 Primary insomnia: Secondary | ICD-10-CM | POA: Diagnosis not present

## 2024-06-02 DIAGNOSIS — G301 Alzheimer's disease with late onset: Secondary | ICD-10-CM

## 2024-06-02 DIAGNOSIS — E7800 Pure hypercholesterolemia, unspecified: Secondary | ICD-10-CM | POA: Diagnosis not present

## 2024-06-02 DIAGNOSIS — K5901 Slow transit constipation: Secondary | ICD-10-CM | POA: Diagnosis not present

## 2024-06-02 DIAGNOSIS — F02818 Dementia in other diseases classified elsewhere, unspecified severity, with other behavioral disturbance: Secondary | ICD-10-CM

## 2024-06-02 NOTE — Progress Notes (Signed)
 Location:  [facility] Room Number: 141A Place of Service:  SNF [phone]) Provider:  Xaden Kaufman,NP  Marguerite Shiley, MD  Patient Care Team: Marguerite Shiley, MD as PCP - General (Internal Medicine) Woodrow Hazy, MD as Consulting Physician (Dermatology)  Extended Emergency Contact Information Primary Emergency Contact: Doro,[name] Address: [address]          [place], [place] [address] [place] Home Phone: [phone] Relation: Son  Code Status:  DNR Goals of care: Advanced Directive information    06/02/2024   12:49 PM  Advanced Directives  Does Patient Have a Medical Advance Directive? Yes  Type of Estate agent of [place];Living will;Out of facility DNR (pink MOST or yellow form)  Does patient want to make changes to medical advance directive? No - Patient declined  Copy of Healthcare Power of Attorney in Chart? Yes - validated most recent copy scanned in chart (See row information)  Pre-existing out of facility DNR order (yellow form or pink MOST form) Pink MOST/Yellow Form most recent copy in chart - Physician notified to receive inpatient order     Chief Complaint  Patient presents with   Medical Management of Chronic Issues    Routine.    HPI:  Pt is a 89 y.o. female seen today for medical management of chronic diseases.    Resides in skilled care due to severe Alz dementia. Not able to answer questions for MMSE.  Very pleasant.  Uses hoyer lift for transfers. Needs assistance with all ADls and is incontinent.  Goals of care are comfort based with no hospitalizations.  HLD off zocor  due to goals of care.   Constipation: regular BMs on colace and miralax    Weights reviewed  Wt Readings from Last 3 Encounters:  06/02/24 144 lb 6.4 oz (65.5 kg)  05/05/24 142 lb 9.6 oz (64.7 kg)  04/08/24 145 lb (65.8 kg)      On regular diet. No dysphagia reported however she recently changed to  rinseless tooth paste to help prevent issues of aspiration   OP: BMD 08/22/21 T score -2.8  On vit d  Currently using nystatin due to buttocks erythema.  Past Medical History:  Diagnosis Date   Alzheimer's disease (HCC)    High cholesterol    Uterine cancer [facility])    Past Surgical History:  Procedure Laterality Date   ABDOMINAL HYSTERECTOMY  1997   [facility]   cataract surgery Bilateral     Allergies  Allergen Reactions   Penicillins Hives   Sulfa Antibiotics Hives    Outpatient Encounter Medications as of 06/02/2024  Medication Sig   Cholecalciferol (VITAMIN D3) 2000 units TABS Take 1 tablet by mouth daily.   docusate sodium  (COLACE) 100 MG capsule Take 1 capsule (100 mg total) by mouth 2 (two) times daily.   Melatonin 1 MG TABS Take by mouth at bedtime.   memantine  (NAMENDA  XR) 28 MG CP24 24 hr capsule Take 1 capsule (28 mg total) by mouth daily.   Nystatin POWD 100,000 Units by Does not apply route 2 (two) times daily.   polyethylene glycol powder (GLYCOLAX /MIRALAX ) 17 GM/SCOOP powder Take 17 g by mouth every other day.   No facility-administered encounter medications on file as of 06/02/2024.    Review of Systems  Unable to perform ROS: Dementia    Immunization History  Administered Date(s) Administered   Fluad Quad(high Dose 65+) 10/03/2022, 10/20/2023   Influenza, High Dose Seasonal PF 10/07/2019  Influenza,inj,Quad PF,6+ Mos 10/22/2018   Influenza-Unspecified 10/31/2015, 10/23/2016, 10/19/2017, 10/23/2020, 10/02/2021, 10/03/2022   Moderna SARS-COV2 Booster Vaccination 10/09/2021, 04/24/2022, 11/03/2022, 04/24/2023   Moderna Sars-Covid-2 Vaccination 01/31/2020, 02/28/2020, 10/16/2020   PFIZER Comirnaty(Gray Top)Covid-19 Tri-Sucrose Vaccine 10/20/2023   Pfizer Covid-19 Vaccine Bivalent Booster 25yrs & up 04/24/2023   Pneumococcal Conjugate-13 06/16/2018   Pneumococcal Polysaccharide-23 02/04/2012   Tdap 10/16/2018   Zoster Recombinant(Shingrix) 07/10/2017,  11/13/2017, 06/27/2018   Pertinent  Health Maintenance Due  Topic Date Due   INFLUENZA VACCINE  07/29/2024   DEXA SCAN  Completed   Colonoscopy  Discontinued      03/02/2023   12:20 PM 06/02/2023   12:34 PM 09/18/2023    8:35 AM 01/11/2024    2:57 PM 05/05/2024    2:30 PM  Fall Risk  Falls in the past year? 0  0 0 0  Was there an injury with Fall? 0 0 0 0 0  Fall Risk Category Calculator 0  0 0 0  Patient at Risk for Falls Due to Impaired balance/gait;Impaired mobility History of fall(s);Impaired balance/gait;Impaired mobility;Mental status change  No Fall Risks   Fall risk Follow up Falls evaluation completed Falls evaluation completed;Education provided;Falls prevention discussed Falls evaluation completed Falls evaluation completed Falls evaluation completed   Functional Status Survey:    Vitals:   06/02/24 1242  BP: 133/67  Pulse: 60  Resp: 14  Temp: (!) 97.3 F (36.3 C)  SpO2: 94%  Weight: 144 lb 6.4 oz (65.5 kg)  Height: 5\' 1"  (1.549 m)   Body mass index is 27.28 kg/m. Physical Exam Constitutional:      General: She is not in acute distress.    Appearance: She is not diaphoretic.  HENT:     Head: Normocephalic and atraumatic.     Mouth/Throat:     Mouth: Mucous membranes are moist.     Pharynx: Oropharynx is clear.  Eyes:     Conjunctiva/sclera: Conjunctivae normal.     Pupils: Pupils are equal, round, and reactive to light.  Neck:     Vascular: No JVD.  Cardiovascular:     Rate and Rhythm: Normal rate and regular rhythm.     Heart sounds: No murmur heard. Pulmonary:     Effort: Pulmonary effort is normal. No respiratory distress.     Breath sounds: Normal breath sounds. No wheezing.  Abdominal:     General: Abdomen is flat. Bowel sounds are normal.     Palpations: Abdomen is soft.  Skin:    General: Skin is warm and dry.  Neurological:     General: No focal deficit present.     Mental Status: She is alert. Mental status is at baseline.  Psychiatric:         Mood and Affect: Mood normal.     Labs reviewed: Recent Labs    11/09/23 0000  NA 142  K 4.5  CL 105  CO2 26*  BUN 16  CREATININE 0.8  CALCIUM 9.8   Recent Labs    11/09/23 0000  AST 18  ALT 13  ALKPHOS 98  ALBUMIN 3.9   Recent Labs    11/09/23 0000  WBC 6.3  HGB 14.0  HCT 41  PLT 243   Lab Results  Component Value Date   TSH 3.40 05/02/2022   No results found for: "HGBA1C" Lab Results  Component Value Date   CHOL 139 06/03/2022   HDL 49 06/03/2022   LDLCALC 76 06/03/2022   TRIG 69 06/03/2022  Significant Diagnostic Results in last 30 days:  No results found.  Assessment/Plan  Slow transit constipation Continue miralax  and senokot.   Primary osteoarthritis involving multiple joints No current issues with pain   Hyperlipidemia Off zocor    Dementia of the Alzheimer's type, with late onset, with delusions (HCC) Progressive decline in cognition and physical function c/w the disease. Continue supportive care in the skilled environment. Remains on namenda .   Insomnia No reports of issues sleeping On melatonin    Intertrigo Continue nystatin  Keep area dry  If worsening recommend lotrisone

## 2024-06-04 ENCOUNTER — Encounter: Payer: Self-pay | Admitting: Adult Health

## 2024-06-04 NOTE — Assessment & Plan Note (Signed)
No current issues with pain.

## 2024-06-04 NOTE — Assessment & Plan Note (Signed)
 No reports of issues sleeping On melatonin

## 2024-06-04 NOTE — Assessment & Plan Note (Signed)
Continue miralax and senokot

## 2024-06-04 NOTE — Assessment & Plan Note (Signed)
 Progressive decline in cognition and physical function c/w the disease. Continue supportive care in the skilled environment. Remains on namenda .

## 2024-06-04 NOTE — Assessment & Plan Note (Signed)
 Off zocor 

## 2024-07-22 ENCOUNTER — Non-Acute Institutional Stay (SKILLED_NURSING_FACILITY): Admitting: Adult Health

## 2024-07-22 ENCOUNTER — Encounter: Payer: Self-pay | Admitting: Adult Health

## 2024-07-22 DIAGNOSIS — F02818 Dementia in other diseases classified elsewhere, unspecified severity, with other behavioral disturbance: Secondary | ICD-10-CM

## 2024-07-22 DIAGNOSIS — M816 Localized osteoporosis [Lequesne]: Secondary | ICD-10-CM | POA: Diagnosis not present

## 2024-07-22 DIAGNOSIS — I10 Essential (primary) hypertension: Secondary | ICD-10-CM | POA: Diagnosis not present

## 2024-07-22 DIAGNOSIS — E7800 Pure hypercholesterolemia, unspecified: Secondary | ICD-10-CM

## 2024-07-22 DIAGNOSIS — F5101 Primary insomnia: Secondary | ICD-10-CM | POA: Diagnosis not present

## 2024-07-22 DIAGNOSIS — B309 Viral conjunctivitis, unspecified: Secondary | ICD-10-CM

## 2024-07-22 DIAGNOSIS — G301 Alzheimer's disease with late onset: Secondary | ICD-10-CM

## 2024-07-22 DIAGNOSIS — K5901 Slow transit constipation: Secondary | ICD-10-CM

## 2024-07-22 NOTE — Progress Notes (Signed)
 Location:  [facility] Room Number: 141/A Place of Service:  SNF [phone]) Provider:  Darlean Maus, NP    Patient Care Team: Charlanne Fredia CROME, MD as PCP - General (Internal Medicine) Sheldon Lowes, MD as Consulting Physician (Dermatology)  Extended Emergency Contact Information Primary Emergency Contact: Achille,[name] Address: [address] [place], [place] [address] [place] Home Phone: [phone] Relation: Son  Code Status:  DNR Goals of care: Advanced Directive information    07/22/2024    9:57 AM  Advanced Directives  Does Patient Have a Medical Advance Directive? Yes  Type of Estate agent of [employer];Living will;Out of facility DNR (pink MOST or yellow form)  Does patient want to make changes to medical advance directive? No - Patient declined  Copy of Healthcare Power of Attorney in Chart? Yes - validated most recent copy scanned in chart (See row information)  Pre-existing out of facility DNR order (yellow form or pink MOST form) Yellow form placed in chart (order not valid for inpatient use)     Chief Complaint  Patient presents with   Medical Management of Chronic Issues    Routine Visit    HPI:  Pt is a 89 y.o. female seen today for medical management of chronic diseases.     Resides in skilled care due to severe Alz dementia. Not able to answer questions for MMSE.  Very pleasant.  Uses hoyer lift for transfers. Needs assistance with all ADls and is incontinent.  Goals of care are comfort based with no hospitalizations.  HLD off zocor  due to goals of care.   Constipation: regular BMs on colace and miralax    Weights reviewed Wt Readings from Last 3 Encounters:  07/22/24 144 lb (65.3 kg)  06/02/24 144 lb 6.4 oz (65.5 kg)  05/05/24 142 lb 9.6 oz (64.7 kg)    On regular diet. No dysphagia reported however she recently changed to rinseless tooth paste to help prevent issues  of aspiration   OP: BMD 08/22/21 T score -2.8  On vit d Past Medical History:  Diagnosis Date   Alzheimer's disease (HCC)    High cholesterol    Uterine cancer [facility])    Past Surgical History:  Procedure Laterality Date   ABDOMINAL HYSTERECTOMY  1997   [facility]   cataract surgery Bilateral     Allergies  Allergen Reactions   Penicillins Hives   Sulfa Antibiotics Hives    Outpatient Encounter Medications as of 07/22/2024  Medication Sig   Cholecalciferol (VITAMIN D3) 2000 units TABS Take 1 tablet by mouth daily.   docusate sodium  (COLACE) 100 MG capsule Take 1 capsule (100 mg total) by mouth 2 (two) times daily.   Melatonin 1 MG TABS Take by mouth at bedtime.   memantine  (NAMENDA  XR) 28 MG CP24 24 hr capsule Take 1 capsule (28 mg total) by mouth daily.   Nystatin POWD 100,000 Units by Does not apply route 2 (two) times daily.   polyethylene glycol powder (GLYCOLAX /MIRALAX ) 17 GM/SCOOP powder Take 17 g by mouth every other day.   No facility-administered encounter medications on file as of 07/22/2024.    Review of Systems  Unable to perform ROS: Dementia    Immunization History  Administered Date(s) Administered   Fluad Quad(high Dose 65+) 10/03/2022, 10/20/2023   Influenza, High Dose Seasonal PF 10/07/2019   Influenza,inj,Quad PF,6+ Mos 10/22/2018   Influenza-Unspecified 10/31/2015, 10/23/2016, 10/19/2017, 10/23/2020, 10/02/2021, 10/03/2022  Moderna SARS-COV2 Booster Vaccination 10/09/2021, 04/24/2022, 11/03/2022, 04/24/2023   Moderna Sars-Covid-2 Vaccination 01/31/2020, 02/28/2020, 10/16/2020   PFIZER Comirnaty(Gray Top)Covid-19 Tri-Sucrose Vaccine 10/20/2023   Pfizer Covid-19 Vaccine Bivalent Booster 34yrs & up 04/24/2023   Pneumococcal Conjugate-13 06/16/2018   Pneumococcal Polysaccharide-23 02/04/2012   Tdap 10/16/2018   Zoster Recombinant(Shingrix) 07/10/2017, 11/13/2017, 06/27/2018   Pertinent  Health Maintenance Due  Topic Date Due   INFLUENZA VACCINE   07/29/2024   DEXA SCAN  Completed   Colonoscopy  Discontinued      03/02/2023   12:20 PM 06/02/2023   12:34 PM 09/18/2023    8:35 AM 01/11/2024    2:57 PM 05/05/2024    2:30 PM  Fall Risk  Falls in the past year? 0  0 0 0  Was there an injury with Fall? 0 0 0 0 0  Fall Risk Category Calculator 0  0 0 0  Patient at Risk for Falls Due to Impaired balance/gait;Impaired mobility History of fall(s);Impaired balance/gait;Impaired mobility;Mental status change  No Fall Risks   Fall risk Follow up Falls evaluation completed Falls evaluation completed;Education provided;Falls prevention discussed Falls evaluation completed Falls evaluation completed Falls evaluation completed   Functional Status Survey:    Vitals:   07/22/24 1308  BP: 134/67  Pulse: 66  Resp: 17  SpO2: 97%  Weight: 144 lb (65.3 kg)   Body mass index is 27.21 kg/m. Physical Exam Vitals reviewed.  Constitutional:      General: She is not in acute distress.    Appearance: She is not diaphoretic.  HENT:     Head: Normocephalic and atraumatic.  Eyes:     General:        Right eye: Discharge (slight yellow and clear discharge) present.        Left eye: No discharge.     Pupils: Pupils are equal, round, and reactive to light.     Comments: Right eye with mild erythema to sclera and conjunctiva.  Mild erythema to the right lower lid   Neck:     Vascular: No JVD.  Cardiovascular:     Rate and Rhythm: Normal rate and regular rhythm.     Heart sounds: No murmur heard. Pulmonary:     Effort: Pulmonary effort is normal. No respiratory distress.     Breath sounds: Normal breath sounds. No wheezing.  Abdominal:     General: Bowel sounds are normal. There is no distension.     Palpations: Abdomen is soft.     Tenderness: There is no abdominal tenderness.  Musculoskeletal:     Right lower leg: No edema.     Left lower leg: No edema.  Skin:    General: Skin is warm and dry.  Neurological:     Mental Status: She is alert.  Mental status is at baseline.     Labs reviewed: Recent Labs    11/09/23 0000  NA 142  K 4.5  CL 105  CO2 26*  BUN 16  CREATININE 0.8  CALCIUM 9.8   Recent Labs    11/09/23 0000  AST 18  ALT 13  ALKPHOS 98  ALBUMIN 3.9   Recent Labs    11/09/23 0000  WBC 6.3  HGB 14.0  HCT 41  PLT 243   Lab Results  Component Value Date   TSH 3.40 05/02/2022   No results found for: HGBA1C Lab Results  Component Value Date   CHOL 139 06/03/2022   HDL 49 06/03/2022   LDLCALC 76 06/03/2022  TRIG 69 06/03/2022    Significant Diagnostic Results in last 30 days:  No results found.  Assessment/Plan  Dementia of the Alzheimer's type, with late onset, with delusions (HCC) Progressive decline in cognition and physical function c/w the disease. Continue supportive care in the skilled environment. Remains on namenda .   Essential hypertension Controlled without meds  Hyperlipidemia Off zocor  due to goals of care.   Insomnia No reports of issues sleeping On melatonin   Osteoporosis Not currently on medication due to goals of care and non ambulatory status.   Slow transit constipation Continue miralax  and senokot.    Viral conjunctivitis Add artificial tears for three days Monitor for increase drainage or redness.

## 2024-07-22 NOTE — Assessment & Plan Note (Signed)
 Not currently on medication due to goals of care and non ambulatory status.

## 2024-07-22 NOTE — Assessment & Plan Note (Signed)
 Progressive decline in cognition and physical function c/w the disease. Continue supportive care in the skilled environment. Remains on namenda .

## 2024-07-22 NOTE — Assessment & Plan Note (Signed)
Continue miralax and senokot

## 2024-07-22 NOTE — Assessment & Plan Note (Signed)
 Controlled without meds

## 2024-07-22 NOTE — Assessment & Plan Note (Signed)
 Off zocor  due to goals of care.

## 2024-07-22 NOTE — Assessment & Plan Note (Signed)
 No reports of issues sleeping On melatonin

## 2024-08-15 ENCOUNTER — Encounter: Payer: Self-pay | Admitting: Internal Medicine

## 2024-08-15 ENCOUNTER — Non-Acute Institutional Stay (SKILLED_NURSING_FACILITY): Payer: Self-pay | Admitting: Internal Medicine

## 2024-08-15 DIAGNOSIS — M816 Localized osteoporosis [Lequesne]: Secondary | ICD-10-CM | POA: Diagnosis not present

## 2024-08-15 DIAGNOSIS — F5101 Primary insomnia: Secondary | ICD-10-CM | POA: Diagnosis not present

## 2024-08-15 DIAGNOSIS — F02818 Dementia in other diseases classified elsewhere, unspecified severity, with other behavioral disturbance: Secondary | ICD-10-CM

## 2024-08-15 DIAGNOSIS — G301 Alzheimer's disease with late onset: Secondary | ICD-10-CM | POA: Diagnosis not present

## 2024-08-15 NOTE — Progress Notes (Unsigned)
 Location:  [facility] Room Number: 141-P Place of Service:  SNF [phone]) Provider:  Charlanne Fredia CROME, MD  Patient Care Team: Charlanne Fredia CROME, MD as PCP - General (Internal Medicine) Sheldon Lowes, MD as Consulting Physician (Dermatology)  Extended Emergency Contact Information Primary Emergency Contact: Lautner,[name] Address: [address]          [place], [place] [address] [place] Home Phone: [phone] Relation: Son  Code Status:  DNR Goals of care: Advanced Directive information    07/22/2024    9:57 AM  Advanced Directives  Does Patient Have a Medical Advance Directive? Yes  Type of Estate agent of [place];Living will;Out of facility DNR (pink MOST or yellow form)  Does patient want to make changes to medical advance directive? No - Patient declined  Copy of Healthcare Power of Attorney in Chart? Yes - validated most recent copy scanned in chart (See row information)  Pre-existing out of facility DNR order (yellow form or pink MOST form) Yellow form placed in chart (order not valid for inpatient use)     Chief Complaint  Patient presents with   Medical Management of Chronic Issues    HPI:  Pt is a 89 y.o. female seen today for medical management of chronic diseases.    Lives in SNF in [place]   Patient has h/o Alzheimer's Disease and HLD She is Aphasic and Non Ambulatory Hoyer lift dependent Needs Help with Feeding     She is stable. No new Nursing issues. No Behavior issues Her weight is stable No Falls Wt Readings from Last 3 Encounters:  08/15/24 142 lb 9.6 oz (64.7 kg)  07/22/24 144 lb (65.3 kg)  06/02/24 144 lb 6.4 oz (65.5 kg)   Past Medical History:  Diagnosis Date   Alzheimer's disease (HCC)    High cholesterol    Uterine cancer (HCC)    Past Surgical History:  Procedure Laterality Date   ABDOMINAL HYSTERECTOMY  1997   [facility]   cataract surgery Bilateral      Allergies  Allergen Reactions   Penicillins Hives   Sulfa Antibiotics Hives    Outpatient Encounter Medications as of 08/15/2024  Medication Sig   Cholecalciferol (VITAMIN D3) 2000 units TABS Take 1 tablet by mouth daily.   docusate sodium  (COLACE) 100 MG capsule Take 1 capsule (100 mg total) by mouth 2 (two) times daily.   Melatonin 1 MG TABS Take by mouth at bedtime.   memantine  (NAMENDA  XR) 28 MG CP24 24 hr capsule Take 1 capsule (28 mg total) by mouth daily.   polyethylene glycol powder (GLYCOLAX /MIRALAX ) 17 GM/SCOOP powder Take 17 g by mouth every other day.   Nystatin POWD 100,000 Units by Does not apply route 2 (two) times daily. (Patient not taking: Reported on 08/15/2024)   No facility-administered encounter medications on file as of 08/15/2024.    Review of Systems  Unable to perform ROS: Dementia    Immunization History  Administered Date(s) Administered   Fluad Quad(high Dose 65+) 10/03/2022, 10/20/2023   Influenza, High Dose Seasonal PF 10/07/2019   Influenza,inj,Quad PF,6+ Mos 10/22/2018   Influenza-Unspecified 10/31/2015, 10/23/2016, 10/19/2017, 10/23/2020, 10/02/2021, 10/03/2022   Moderna SARS-COV2 Booster Vaccination 10/09/2021, 04/24/2022, 11/03/2022, 04/24/2023   Moderna Sars-Covid-2 Vaccination 01/31/2020, 02/28/2020, 10/16/2020   PFIZER Comirnaty(Gray Top)Covid-19 Tri-Sucrose Vaccine 10/20/2023   Pfizer Covid-19 Vaccine Bivalent Booster 47yrs & up 04/24/2023   Pneumococcal Conjugate-13 06/16/2018   Pneumococcal Polysaccharide-23 02/04/2012   Tdap 10/16/2018  Zoster Recombinant(Shingrix) 07/10/2017, 11/13/2017, 06/27/2018   Pertinent  Health Maintenance Due  Topic Date Due   INFLUENZA VACCINE  07/29/2024   DEXA SCAN  Completed   Colonoscopy  Discontinued      03/02/2023   12:20 PM 06/02/2023   12:34 PM 09/18/2023    8:35 AM 01/11/2024    2:57 PM 05/05/2024    2:30 PM  Fall Risk  Falls in the past year? 0  0 0 0  Was there an injury with Fall? 0 0 0  0 0  Fall Risk Category Calculator 0  0 0 0  Patient at Risk for Falls Due to Impaired balance/gait;Impaired mobility History of fall(s);Impaired balance/gait;Impaired mobility;Mental status change  No Fall Risks   Fall risk Follow up Falls evaluation completed Falls evaluation completed;Education provided;Falls prevention discussed Falls evaluation completed Falls evaluation completed Falls evaluation completed   Functional Status Survey:    Vitals:   08/15/24 1133 08/15/24 1134  BP: (!) 142/78 134/67  Pulse: 76   Temp: 97.7 F (36.5 C)   SpO2: 96%   Weight: 142 lb 9.6 oz (64.7 kg)   Height: 5' 1 (1.549 m)    Body mass index is 26.94 kg/m. Physical Exam Vitals reviewed.  Constitutional:      Appearance: Normal appearance.  HENT:     Head: Normocephalic.     Nose: Nose normal.     Mouth/Throat:     Mouth: Mucous membranes are moist.     Pharynx: Oropharynx is clear.  Eyes:     Pupils: Pupils are equal, round, and reactive to light.  Cardiovascular:     Rate and Rhythm: Normal rate and regular rhythm.     Pulses: Normal pulses.     Heart sounds: Normal heart sounds. No murmur heard. Pulmonary:     Effort: Pulmonary effort is normal.     Breath sounds: Normal breath sounds.  Abdominal:     General: Abdomen is flat. Bowel sounds are normal.     Palpations: Abdomen is soft.  Musculoskeletal:        General: No swelling.     Cervical back: Neck supple.  Skin:    General: Skin is warm.  Neurological:     Mental Status: She is alert.  Psychiatric:        Mood and Affect: Mood normal.        Thought Content: Thought content normal.     Labs reviewed: Recent Labs    11/09/23 0000  NA 142  K 4.5  CL 105  CO2 26*  BUN 16  CREATININE 0.8  CALCIUM 9.8   Recent Labs    11/09/23 0000  AST 18  ALT 13  ALKPHOS 98  ALBUMIN 3.9   Recent Labs    11/09/23 0000  WBC 6.3  HGB 14.0  HCT 41  PLT 243   Lab Results  Component Value Date   TSH 3.40 05/02/2022    No results found for: HGBA1C Lab Results  Component Value Date   CHOL 139 06/03/2022   HDL 49 06/03/2022   LDLCALC 76 06/03/2022   TRIG 69 06/03/2022    Significant Diagnostic Results in last 30 days:  No results found.  Assessment/Plan 1. Dementia of the Alzheimer's type, with late onset, with delusions (HCC) (Primary) Namenda  Goals of are comfort  2. Primary insomnia Melatonin  3. Localized osteoporosis without current pathological fracture She is Non Ambulatory On Vit D    Family/ staff Communication:   Labs/tests  ordered:

## 2024-09-15 ENCOUNTER — Non-Acute Institutional Stay (SKILLED_NURSING_FACILITY): Admitting: Adult Health

## 2024-09-15 ENCOUNTER — Encounter: Payer: Self-pay | Admitting: Adult Health

## 2024-09-15 DIAGNOSIS — F02818 Dementia in other diseases classified elsewhere, unspecified severity, with other behavioral disturbance: Secondary | ICD-10-CM

## 2024-09-15 DIAGNOSIS — M816 Localized osteoporosis [Lequesne]: Secondary | ICD-10-CM | POA: Diagnosis not present

## 2024-09-15 DIAGNOSIS — E7800 Pure hypercholesterolemia, unspecified: Secondary | ICD-10-CM

## 2024-09-15 DIAGNOSIS — G301 Alzheimer's disease with late onset: Secondary | ICD-10-CM | POA: Diagnosis not present

## 2024-09-15 DIAGNOSIS — I10 Essential (primary) hypertension: Secondary | ICD-10-CM

## 2024-09-15 DIAGNOSIS — K5901 Slow transit constipation: Secondary | ICD-10-CM

## 2024-09-15 DIAGNOSIS — F5101 Primary insomnia: Secondary | ICD-10-CM | POA: Diagnosis not present

## 2024-09-15 NOTE — Assessment & Plan Note (Signed)
 T score -2.8 Not ambulatory and would likely not benefit from additional meds.

## 2024-09-15 NOTE — Assessment & Plan Note (Signed)
 Off zocor  due to goals of care.

## 2024-09-15 NOTE — Progress Notes (Signed)
 Location:  Medical illustrator of Service:  SNF (31) Provider:  Darlean Maus, NP    Patient Care Team: Charlanne Fredia CROME, MD as PCP - General (Internal Medicine) Sheldon Lowes, MD as Consulting Physician (Dermatology)  Extended Emergency Contact Information Primary Emergency Contact: Ambs,[name] Address: [address]          [place], [place] [address] [place] Home Phone: [phone] Relation: Son  Code Status:  DNR Goals of care: Advanced Directive information    07/22/2024    9:57 AM  Advanced Directives  Does Patient Have a Medical Advance Directive? Yes  Type of Estate agent of [place];Living will;Out of facility DNR (pink MOST or yellow form)  Does patient want to make changes to medical advance directive? No - Patient declined  Copy of Healthcare Power of Attorney in Chart? Yes - validated most recent copy scanned in chart (See row information)  Pre-existing out of facility DNR order (yellow form or pink MOST form) Yellow form placed in chart (order not valid for inpatient use)     Chief Complaint  Patient presents with   Medical Management of Chronic Issues    HPI:  Pt is a 89 y.o. female seen today for medical management of chronic diseases.    Resides in skilled care due to severe Alz dementia. Not able to answer questions for MMSE.  Very pleasant.  Uses hoyer lift for transfers. Needs assistance with all ADls and is incontinent.  Goals of care are comfort based with no hospitalizations.  Recurrent conjunctivitis not currently an issue.   HLD off zocor  due to goals of care.   Constipation: colace and miralax  being held at times due to loose stools.   Weights reviewed Wt Readings from Last 3 Encounters:  09/15/24 145 lb 6.4 oz (66 kg)  08/15/24 142 lb 9.6 oz (64.7 kg)  07/22/24 144 lb (65.3 kg)    On regular diet.  OP: BMD 08/22/21 T score -2.8  On vit d Past Medical History:   Diagnosis Date   Alzheimer's disease (HCC)    High cholesterol    Uterine cancer [facility])    Past Surgical History:  Procedure Laterality Date   ABDOMINAL HYSTERECTOMY  1997   [facility]   cataract surgery Bilateral     Allergies  Allergen Reactions   Penicillins Hives   Sulfa Antibiotics Hives    Outpatient Encounter Medications as of 09/15/2024  Medication Sig   Cholecalciferol (VITAMIN D3) 2000 units TABS Take 1 tablet by mouth daily.   docusate sodium  (COLACE) 100 MG capsule Take 1 capsule (100 mg total) by mouth 2 (two) times daily.   Melatonin 1 MG TABS Take by mouth at bedtime.   memantine  (NAMENDA  XR) 28 MG CP24 24 hr capsule Take 1 capsule (28 mg total) by mouth daily.   Nystatin POWD 100,000 Units by Does not apply route 2 (two) times daily. (Patient not taking: Reported on 08/15/2024)   polyethylene glycol powder (GLYCOLAX /MIRALAX ) 17 GM/SCOOP powder Take 17 g by mouth every other day.   No facility-administered encounter medications on file as of 09/15/2024.    Review of Systems  Unable to perform ROS: Dementia    Immunization History  Administered Date(s) Administered   Fluad Quad(high Dose 65+) 10/03/2022, 10/20/2023   INFLUENZA, HIGH DOSE SEASONAL PF 10/07/2019   Influenza,inj,Quad PF,6+ Mos 10/22/2018   Influenza-Unspecified 10/31/2015, 10/23/2016, 10/19/2017, 10/23/2020, 10/02/2021, 10/03/2022   Moderna SARS-COV2 Booster Vaccination 10/09/2021, 04/24/2022,  11/03/2022, 04/24/2023   Moderna Sars-Covid-2 Vaccination 01/31/2020, 02/28/2020, 10/16/2020   PFIZER Comirnaty(Gray Top)Covid-19 Tri-Sucrose Vaccine 10/20/2023   Pfizer Covid-19 Vaccine Bivalent Booster 38yrs & up 04/24/2023   Pneumococcal Conjugate-13 06/16/2018   Pneumococcal Polysaccharide-23 02/04/2012   Tdap 10/16/2018   Zoster Recombinant(Shingrix) 07/10/2017, 11/13/2017, 06/27/2018   Pertinent  Health Maintenance Due  Topic Date Due   Influenza Vaccine  07/29/2024   DEXA SCAN  Completed    Mammogram  Discontinued   Colonoscopy  Discontinued      03/02/2023   12:20 PM 06/02/2023   12:34 PM 09/18/2023    8:35 AM 01/11/2024    2:57 PM 05/05/2024    2:30 PM  Fall Risk  Falls in the past year? 0  0 0 0  Was there an injury with Fall? 0 0 0 0 0  Fall Risk Category Calculator 0  0 0 0  Patient at Risk for Falls Due to Impaired balance/gait;Impaired mobility History of fall(s);Impaired balance/gait;Impaired mobility;Mental status change  No Fall Risks   Fall risk Follow up Falls evaluation completed Falls evaluation completed;Education provided;Falls prevention discussed Falls evaluation completed Falls evaluation completed Falls evaluation completed   Functional Status Survey:    Vitals:   09/15/24 1525  BP: 117/74  Pulse: 64  Resp: 20  Temp: (!) 97 F (36.1 C)  SpO2: 95%  Weight: 145 lb 6.4 oz (66 kg)   Body mass index is 27.47 kg/m. Physical Exam Vitals reviewed.  Constitutional:      General: She is not in acute distress.    Appearance: She is not diaphoretic.  HENT:     Head: Normocephalic and atraumatic.  Eyes:     General:        Right eye: Discharge (slight yellow and clear discharge) present.        Left eye: No discharge.     Pupils: Pupils are equal, round, and reactive to light.     Comments: Right eye with mild erythema to sclera and conjunctiva.  Mild erythema to the right lower lid   Neck:     Vascular: No JVD.  Cardiovascular:     Rate and Rhythm: Normal rate and regular rhythm.     Heart sounds: No murmur heard. Pulmonary:     Effort: Pulmonary effort is normal. No respiratory distress.     Breath sounds: Normal breath sounds. No wheezing.  Abdominal:     General: Bowel sounds are normal. There is no distension.     Palpations: Abdomen is soft.     Tenderness: There is no abdominal tenderness.  Musculoskeletal:     Right lower leg: No edema.     Left lower leg: No edema.  Skin:    General: Skin is warm and dry.  Neurological:     Mental  Status: She is alert. Mental status is at baseline.     Labs reviewed: Recent Labs    11/09/23 0000  NA 142  K 4.5  CL 105  CO2 26*  BUN 16  CREATININE 0.8  CALCIUM 9.8   Recent Labs    11/09/23 0000  AST 18  ALT 13  ALKPHOS 98  ALBUMIN 3.9   Recent Labs    11/09/23 0000  WBC 6.3  HGB 14.0  HCT 41  PLT 243   Lab Results  Component Value Date   TSH 3.40 05/02/2022   No results found for: HGBA1C Lab Results  Component Value Date   CHOL 139 06/03/2022   HDL  49 06/03/2022   LDLCALC 76 06/03/2022   TRIG 69 06/03/2022    Significant Diagnostic Results in last 30 days:  No results found.  Assessment/Plan  Dementia of the Alzheimer's type, with late onset, with delusions (HCC) Progressive decline in cognition and physical function c/w the disease. Continue supportive care in the skilled environment. Remains on namenda .   Hyperlipidemia Off zocor  due to goals of care.   Osteoporosis T score -2.8 Not ambulatory and would likely not benefit from additional meds.   Slow transit constipation Continue miralax   D/c colace due to loose stools  Essential hypertension Hx of not on meds.   Insomnia No reports of issues sleeping On melatonin

## 2024-09-15 NOTE — Assessment & Plan Note (Signed)
 No reports of issues sleeping On melatonin

## 2024-09-15 NOTE — Assessment & Plan Note (Signed)
 Hx of not on meds.

## 2024-09-15 NOTE — Assessment & Plan Note (Addendum)
 Continue miralax   D/c colace due to loose stools

## 2024-09-15 NOTE — Assessment & Plan Note (Signed)
 Progressive decline in cognition and physical function c/w the disease. Continue supportive care in the skilled environment. Remains on namenda .

## 2024-10-17 ENCOUNTER — Non-Acute Institutional Stay (SKILLED_NURSING_FACILITY): Payer: Self-pay | Admitting: Adult Health

## 2024-10-17 ENCOUNTER — Encounter: Payer: Self-pay | Admitting: Adult Health

## 2024-10-17 DIAGNOSIS — F5101 Primary insomnia: Secondary | ICD-10-CM | POA: Diagnosis not present

## 2024-10-17 DIAGNOSIS — G309 Alzheimer's disease, unspecified: Secondary | ICD-10-CM

## 2024-10-17 DIAGNOSIS — K5901 Slow transit constipation: Secondary | ICD-10-CM

## 2024-10-17 DIAGNOSIS — Z8542 Personal history of malignant neoplasm of other parts of uterus: Secondary | ICD-10-CM

## 2024-10-17 DIAGNOSIS — F0280 Dementia in other diseases classified elsewhere without behavioral disturbance: Secondary | ICD-10-CM

## 2024-10-17 NOTE — Progress Notes (Unsigned)
 [facility] Room Number: 141 P Place of Service:  SNF (31) Provider:  Tawni America, NP   Patient Care Team: Charlanne Fredia CROME, MD as PCP - General (Internal Medicine) Sheldon Lowes, MD as Consulting Physician (Dermatology)  Extended Emergency Contact Information Primary Emergency Contact: Maiello,[name] Address: [address]          [place], [place] [address] [place] Home Phone: [phone] Relation: Son  Code Status:  DNR, No Hospitalizations, Follow [facility] instructions Goals of care: Advanced Directive information    07/22/2024    9:57 AM  Advanced Directives  Does Patient Have a Medical Advance Directive? Yes  Type of Estate agent of [place];Living will;Out of facility DNR (pink MOST or yellow form)  Does patient want to make changes to medical advance directive? No - Patient declined  Copy of Healthcare Power of Attorney in Chart? Yes - validated most recent copy scanned in chart (See row information)  Pre-existing out of facility DNR order (yellow form or pink MOST form) Yellow form placed in chart (order not valid for inpatient use)     Chief Complaint  Patient presents with   Routine Visit    HPI:  Pt is a 89 y.o. female seen today for medical management of chronic diseases.     Resides in skilled care due to severe Alz dementia. Minimal verbalization.   Uses hoyer lift for transfers. Needs assistance with all ADls and is incontinent.  Goals of care are comfort based with no hospitalizations.  HLD off zocor  due to goals of care.   Constipation: no longer using miralax  due to loose stools which are improved but still are not formed per staff at times. Whole food noted in stool. Staff concerned that she may not be chewing properly. No reports of choking.   Weights reviewed Wt Readings from Last 3 Encounters:  10/17/24 145 lb 6.4 oz (66 kg)  09/15/24 145 lb 6.4 oz (66 kg)   08/15/24 142 lb 9.6 oz (64.7 kg)     OP: BMD 08/22/21 T score -2.8  On vit d  On melatonin for sleep, no issues. Past Medical History:  Diagnosis Date   Alzheimer's disease (HCC)    High cholesterol    Uterine cancer [facility])    Past Surgical History:  Procedure Laterality Date   ABDOMINAL HYSTERECTOMY  1997   [facility]   cataract surgery Bilateral     Allergies  Allergen Reactions   Penicillins Hives   Sulfa Antibiotics Hives    Outpatient Encounter Medications as of 10/17/2024  Medication Sig   Cholecalciferol (VITAMIN D3) 2000 units TABS Take 1 tablet by mouth daily.   Melatonin 1 MG TABS Take by mouth at bedtime.   memantine  (NAMENDA  XR) 28 MG CP24 24 hr capsule Take 1 capsule (28 mg total) by mouth daily.   polyethylene glycol powder (GLYCOLAX /MIRALAX ) 17 GM/SCOOP powder Take 17 g by mouth every other day.   No facility-administered encounter medications on file as of 10/17/2024.    Review of Systems  Unable to perform ROS: Dementia    Immunization History  Administered Date(s) Administered   Fluad Quad(high Dose 65+) 10/03/2022, 10/20/2023   INFLUENZA, HIGH DOSE SEASONAL PF 10/07/2019   Influenza,inj,Quad PF,6+ Mos 10/22/2018   Influenza-Unspecified 10/31/2015, 10/23/2016, 10/19/2017, 10/23/2020, 10/02/2021, 10/03/2022, 09/27/2024   Moderna SARS-COV2 Booster Vaccination 10/09/2021, 04/24/2022, 11/03/2022, 04/24/2023   Moderna Sars-Covid-2 Vaccination 01/31/2020, 02/28/2020, 10/16/2020   PFIZER Comirnaty(Gray Top)Covid-19 Tri-Sucrose  Vaccine 10/20/2023   Pfizer Covid-19 Vaccine Bivalent Booster 45yrs & up 04/24/2023   Pneumococcal Conjugate-13 06/16/2018   Pneumococcal Polysaccharide-23 02/04/2012   Tdap 10/16/2018   Unspecified SARS-COV-2 Vaccination 09/27/2024   Zoster Recombinant(Shingrix) 07/10/2017, 11/13/2017, 06/27/2018   Pertinent  Health Maintenance Due  Topic Date Due   Influenza Vaccine  Completed   DEXA SCAN  Completed   Mammogram   Discontinued   Colonoscopy  Discontinued      03/02/2023   12:20 PM 06/02/2023   12:34 PM 09/18/2023    8:35 AM 01/11/2024    2:57 PM 05/05/2024    2:30 PM  Fall Risk  Falls in the past year? 0  0 0 0  Was there an injury with Fall? 0 0 0 0 0  Fall Risk Category Calculator 0  0 0 0  Patient at Risk for Falls Due to Impaired balance/gait;Impaired mobility History of fall(s);Impaired balance/gait;Impaired mobility;Mental status change  No Fall Risks   Fall risk Follow up Falls evaluation completed Falls evaluation completed;Education provided;Falls prevention discussed Falls evaluation completed Falls evaluation completed Falls evaluation completed   Functional Status Survey:    Vitals:   10/17/24 1040  BP: (!) 149/50  Pulse: 66  Resp: 17  Temp: 97.7 F (36.5 C)  SpO2: 95%  Weight: 145 lb 6.4 oz (66 kg)  Height: 5' 1 (1.549 m)   Body mass index is 27.47 kg/m. Physical Exam Vitals and nursing note reviewed.  Constitutional:      General: She is not in acute distress.    Appearance: Normal appearance. She is not diaphoretic.  HENT:     Head: Normocephalic and atraumatic.     Nose: No congestion.     Mouth/Throat:     Mouth: Mucous membranes are moist.     Pharynx: Oropharynx is clear. No oropharyngeal exudate or posterior oropharyngeal erythema.  Eyes:     Conjunctiva/sclera: Conjunctivae normal.     Pupils: Pupils are equal, round, and reactive to light.  Neck:     Thyroid : No thyromegaly.     Vascular: No carotid bruit or JVD.  Cardiovascular:     Rate and Rhythm: Normal rate and regular rhythm.     Heart sounds: Normal heart sounds. No murmur heard. Pulmonary:     Effort: Pulmonary effort is normal. No respiratory distress.     Breath sounds: Normal breath sounds. No stridor.  Abdominal:     General: Bowel sounds are normal. There is no distension.     Palpations: Abdomen is soft.     Tenderness: There is no abdominal tenderness. There is no right CVA tenderness or  left CVA tenderness.  Musculoskeletal:     Cervical back: No rigidity. No muscular tenderness.     Right lower leg: No edema.     Left lower leg: No edema.  Lymphadenopathy:     Cervical: No cervical adenopathy.  Skin:    General: Skin is warm and dry.  Neurological:     General: No focal deficit present.     Mental Status: She is alert. Mental status is at baseline.     Cranial Nerves: No cranial nerve deficit.  Psychiatric:        Mood and Affect: Mood normal.     Labs reviewed: Recent Labs    11/09/23 0000  NA 142  K 4.5  CL 105  CO2 26*  BUN 16  CREATININE 0.8  CALCIUM 9.8   Recent Labs    11/09/23 0000  AST 18  ALT 13  ALKPHOS 98  ALBUMIN 3.9   Recent Labs    11/09/23 0000  WBC 6.3  HGB 14.0  HCT 41  PLT 243   Lab Results  Component Value Date   TSH 3.40 05/02/2022   No results found for: HGBA1C Lab Results  Component Value Date   CHOL 139 06/03/2022   HDL 49 06/03/2022   LDLCALC 76 06/03/2022   TRIG 69 06/03/2022    Significant Diagnostic Results in last 30 days:  No results found.  Assessment/Plan  Alzheimer's disease (HCC) Progressive decline in cognition and physical function c/w the disease. Continue supportive care in the skilled environment. Continue namenda .   Slow transit constipation Issues of unformed stools.  Consider speech therapy consult.   Insomnia Continue melatonin   History of uterine cancer S/p hysterectomy at [facility]   Labs/tests ordered:  labs done annually Nov

## 2024-10-21 ENCOUNTER — Encounter: Payer: Self-pay | Admitting: Adult Health

## 2024-10-21 MED ORDER — POLYETHYLENE GLYCOL 3350 17 GM/SCOOP PO POWD: 17.0000 g | Freq: Every day | ORAL | Status: AC | PRN

## 2024-10-21 NOTE — Assessment & Plan Note (Signed)
 Progressive decline in cognition and physical function c/w the disease. Continue supportive care in the skilled environment. Continue namenda .

## 2024-10-21 NOTE — Assessment & Plan Note (Signed)
 Continue melatonin

## 2024-10-21 NOTE — Assessment & Plan Note (Signed)
 Issues of unformed stools.  Consider speech therapy consult.

## 2024-10-21 NOTE — Assessment & Plan Note (Signed)
 S/p hysterectomy at [facility]

## 2024-10-27 ENCOUNTER — Encounter: Payer: Self-pay | Admitting: Adult Health

## 2024-10-27 ENCOUNTER — Non-Acute Institutional Stay (SKILLED_NURSING_FACILITY): Admitting: Adult Health

## 2024-10-27 DIAGNOSIS — H04123 Dry eye syndrome of bilateral lacrimal glands: Secondary | ICD-10-CM

## 2024-10-27 MED ORDER — SYSTANE 0.4-0.3 % OP SOLN: 2.0000 [drp] | Freq: Two times a day (BID) | OPHTHALMIC | Status: AC

## 2024-10-27 NOTE — Progress Notes (Signed)
 Location[facility] Room Number: 141-P Place of Service:  SNF (31) Provider:  Tawni America, NO    Patient Care Team: Charlanne Fredia CROME, MD as PCP - General (Internal Medicine) Sheldon Lowes, MD as Consulting Physician (Dermatology)  Extended Emergency Contact Information Primary Emergency Contact: Skare,[name] Address: [address]          [place], [place] [address] [place] Home Phone: [phone] Relation: Son  Code Status:  DNR Goals of care: Advanced Directive information    07/22/2024    9:57 AM  Advanced Directives  Does Patient Have a Medical Advance Directive? Yes  Type of Estate Agent of [place];Living will;Out of facility DNR (pink MOST or yellow form)  Does patient want to make changes to medical advance directive? No - Patient declined  Copy of Healthcare Power of Attorney in Chart? Yes - validated most recent copy scanned in chart (See row information)  Pre-existing out of facility DNR order (yellow form or pink MOST form) Yellow form placed in chart (order not valid for inpatient use)     Chief Complaint  Patient presents with   Eye Redness    HPI:  Pt is a 89 y.o. female seen today for an acute visit for eye redness  The nurse wrote an sbar indicating that Ms. [place] was having some erythema and drainage to her eyes. No fever, cough, or other issues. Has had recurrent conjunctivitis in the past.   Due to her underlying dementia further details were not given.    Past Medical History:  Diagnosis Date   Alzheimer's disease (HCC)    High cholesterol    Uterine cancer [facility])    Past Surgical History:  Procedure Laterality Date   ABDOMINAL HYSTERECTOMY  1997   [facility]   cataract surgery Bilateral     Allergies  Allergen Reactions   Penicillins Hives   Sulfa Antibiotics Hives    Outpatient Encounter Medications as of 10/27/2024  Medication Sig    Cholecalciferol (VITAMIN D3) 2000 units TABS Take 1 tablet by mouth daily.   Melatonin 1 MG TABS Take by mouth at bedtime.   memantine  (NAMENDA  XR) 28 MG CP24 24 hr capsule Take 1 capsule (28 mg total) by mouth daily.   polyethylene glycol powder (GLYCOLAX /MIRALAX ) 17 GM/SCOOP powder Take 17 g by mouth daily as needed.   No facility-administered encounter medications on file as of 10/27/2024.    Review of Systems  Unable to perform ROS: Dementia    Immunization History  Administered Date(s) Administered   Fluad Quad(high Dose 65+) 10/03/2022, 10/20/2023   INFLUENZA, HIGH DOSE SEASONAL PF 10/07/2019   Influenza,inj,Quad PF,6+ Mos 10/22/2018   Influenza-Unspecified 10/31/2015, 10/23/2016, 10/19/2017, 10/23/2020, 10/02/2021, 10/03/2022, 09/27/2024   Moderna SARS-COV2 Booster Vaccination 10/09/2021, 04/24/2022, 11/03/2022, 04/24/2023   Moderna Sars-Covid-2 Vaccination 01/31/2020, 02/28/2020, 10/16/2020   PFIZER Comirnaty(Gray Top)Covid-19 Tri-Sucrose Vaccine 10/20/2023   Pfizer Covid-19 Vaccine Bivalent Booster 65yrs & up 04/24/2023   Pneumococcal Conjugate-13 06/16/2018   Pneumococcal Polysaccharide-23 02/04/2012   Tdap 10/16/2018   Unspecified SARS-COV-2 Vaccination 09/27/2024   Zoster Recombinant(Shingrix) 07/10/2017, 11/13/2017, 06/27/2018   Pertinent  Health Maintenance Due  Topic Date Due   Influenza Vaccine  Completed   DEXA SCAN  Completed   Mammogram  Discontinued   Colonoscopy  Discontinued      03/02/2023   12:20 PM 06/02/2023   12:34 PM 09/18/2023    8:35 AM 01/11/2024    2:57 PM 05/05/2024  2:30 PM  Fall Risk  Falls in the past year? 0  0 0 0  Was there an injury with Fall? 0 0 0 0 0  Fall Risk Category Calculator 0  0 0 0  Patient at Risk for Falls Due to Impaired balance/gait;Impaired mobility History of fall(s);Impaired balance/gait;Impaired mobility;Mental status change  No Fall Risks   Fall risk Follow up Falls evaluation completed Falls evaluation  completed;Education provided;Falls prevention discussed Falls evaluation completed Falls evaluation completed Falls evaluation completed   Functional Status Survey:    Vitals:   10/27/24 1231 10/27/24 1232  BP: (!) 140/84 (!) 165/52  Pulse: 72   Resp: 16   Temp: 97.6 F (36.4 C)   SpO2: 96%   Weight: 145 lb 6.4 oz (66 kg)   Height: 5' 1 (1.549 m)    Body mass index is 27.47 kg/m. Physical Exam Vitals reviewed.  Constitutional:      Appearance: She is normal weight.  HENT:     Head: Normocephalic and atraumatic.     Nose: Nose normal.  Eyes:     General:        Right eye: No discharge.        Left eye: No discharge.     Conjunctiva/sclera: Conjunctivae normal.     Pupils: Pupils are equal, round, and reactive to light.     Comments: Erythema to sclera, no significant drainage.   Neurological:     Mental Status: She is alert. Mental status is at baseline.  Psychiatric:        Mood and Affect: Mood normal.     Labs reviewed: Recent Labs    11/09/23 0000  NA 142  K 4.5  CL 105  CO2 26*  BUN 16  CREATININE 0.8  CALCIUM 9.8   Recent Labs    11/09/23 0000  AST 18  ALT 13  ALKPHOS 98  ALBUMIN 3.9   Recent Labs    11/09/23 0000  WBC 6.3  HGB 14.0  HCT 41  PLT 243   Lab Results  Component Value Date   TSH 3.40 05/02/2022   No results found for: HGBA1C Lab Results  Component Value Date   CHOL 139 06/03/2022   HDL 49 06/03/2022   LDLCALC 76 06/03/2022   TRIG 69 06/03/2022    Significant Diagnostic Results in last 30 days:  No results found.  Assessment/Plan  1. Dry eyes (Primary)  - Polyethyl Glycol-Propyl Glycol (SYSTANE) 0.4-0.3 % SOLN; Apply 2 drops to eye in the morning and at bedtime.  Provide systane for dry eyes, monitor for increased redness, swelling, drainage.

## 2024-11-01 ENCOUNTER — Encounter: Payer: Self-pay | Admitting: Orthopedic Surgery

## 2024-11-01 ENCOUNTER — Non-Acute Institutional Stay: Payer: Self-pay | Admitting: Orthopedic Surgery

## 2024-11-01 DIAGNOSIS — G301 Alzheimer's disease with late onset: Secondary | ICD-10-CM

## 2024-11-01 DIAGNOSIS — H1033 Unspecified acute conjunctivitis, bilateral: Secondary | ICD-10-CM

## 2024-11-01 DIAGNOSIS — F02818 Dementia in other diseases classified elsewhere, unspecified severity, with other behavioral disturbance: Secondary | ICD-10-CM | POA: Diagnosis not present

## 2024-11-01 MED ORDER — OFLOXACIN 0.3 % OP SOLN: 1.0000 [drp] | Freq: Four times a day (QID) | OPHTHALMIC | 0 refills | Status: AC

## 2024-11-01 NOTE — Progress Notes (Signed)
 Location:   [facility]    Place of Service:   [facility]  Provider:  Greig FORBES Cluster, NP   Charlanne Fredia CROME, MD  Patient Care Team: Charlanne Fredia CROME, MD as PCP - General (Internal Medicine) Sheldon Lowes, MD as Consulting Physician (Dermatology)  Extended Emergency Contact Information Primary Emergency Contact: [facility] Address: [address]. [address] M[address]          [place], [place] [address] [place] Home Phone: [phone] Relation: Son  Code Status:  DNR Goals of care: Advanced Directive information    07/22/2024    9:57 AM  Advanced Directives  Does Patient Have a Medical Advance Directive? Yes  Type of Estate Agent of [place];Living will;Out of facility DNR (pink MOST or yellow form)  Does patient want to make changes to medical advance directive? No - Patient declined  Copy of Healthcare Power of Attorney in Chart? Yes - validated most recent copy scanned in chart (See row information)  Pre-existing out of facility DNR order (yellow form or pink MOST form) Yellow form placed in chart (order not valid for inpatient use)     Chief Complaint  Patient presents with   Acute Visit    Bilateral redness and pain    HPI:  Pt is a 89 y.o. female seen today for acute visit due to bilateral eye redness and pain.   Poor historian due to AD. Nursing reports increased bilateral eye redness, pain and drainage > 3 days. She was given systane drops without relief. Afebrile. Vitals stable.    Past Medical History:  Diagnosis Date   Alzheimer's disease (HCC)    High cholesterol    Uterine cancer [facility])    Past Surgical History:  Procedure Laterality Date   ABDOMINAL HYSTERECTOMY  1997   [facility]   cataract surgery Bilateral     Allergies  Allergen Reactions   Penicillins Hives   Sulfa Antibiotics Hives    Outpatient Encounter Medications as of 11/01/2024  Medication Sig   ofloxacin (OCUFLOX) 0.3 %  ophthalmic solution Place 1 drop into both eyes 4 (four) times daily for 5 days.   Cholecalciferol (VITAMIN D3) 2000 units TABS Take 1 tablet by mouth daily.   Melatonin 1 MG TABS Take by mouth at bedtime.   memantine  (NAMENDA  XR) 28 MG CP24 24 hr capsule Take 1 capsule (28 mg total) by mouth daily.   Polyethyl Glycol-Propyl Glycol (SYSTANE) 0.4-0.3 % SOLN Apply 2 drops to eye in the morning and at bedtime.   polyethylene glycol powder (GLYCOLAX /MIRALAX ) 17 GM/SCOOP powder Take 17 g by mouth daily as needed.   No facility-administered encounter medications on file as of 11/01/2024.    Review of Systems  Unable to perform ROS: Dementia    Immunization History  Administered Date(s) Administered   Fluad Quad(high Dose 65+) 10/03/2022, 10/20/2023   INFLUENZA, HIGH DOSE SEASONAL PF 10/07/2019   Influenza,inj,Quad PF,6+ Mos 10/22/2018   Influenza-Unspecified 10/31/2015, 10/23/2016, 10/19/2017, 10/23/2020, 10/02/2021, 10/03/2022, 09/27/2024   Moderna SARS-COV2 Booster Vaccination 10/09/2021, 04/24/2022, 11/03/2022, 04/24/2023, 09/27/2024   Moderna Sars-Covid-2 Vaccination 01/31/2020, 02/28/2020, 10/16/2020   PFIZER Comirnaty(Gray Top)Covid-19 Tri-Sucrose Vaccine 10/20/2023   Pfizer Covid-19 Vaccine Bivalent Booster 63yrs & up 04/24/2023   Pneumococcal Conjugate-13 06/16/2018   Pneumococcal Polysaccharide-23 02/04/2012   Tdap 10/16/2018   Unspecified SARS-COV-2 Vaccination 09/27/2024   Zoster Recombinant(Shingrix) 07/10/2017, 11/13/2017, 06/27/2018   Pertinent  Health Maintenance Due  Topic Date Due   Influenza Vaccine  Completed  DEXA SCAN  Completed   Mammogram  Discontinued   Colonoscopy  Discontinued      03/02/2023   12:20 PM 06/02/2023   12:34 PM 09/18/2023    8:35 AM 01/11/2024    2:57 PM 05/05/2024    2:30 PM  Fall Risk  Falls in the past year? 0  0 0 0  Was there an injury with Fall? 0 0 0 0 0  Fall Risk Category Calculator 0  0 0 0  Patient at Risk for Falls Due to Impaired  balance/gait;Impaired mobility History of fall(s);Impaired balance/gait;Impaired mobility;Mental status change  No Fall Risks   Fall risk Follow up Falls evaluation completed Falls evaluation completed;Education provided;Falls prevention discussed Falls evaluation completed Falls evaluation completed Falls evaluation completed   Functional Status Survey:    Vitals:   11/01/24 1537  BP: (!) 140/84  Pulse: 72  Resp: 16  Temp: (!) 97.3 F (36.3 C)  SpO2: 96%  Weight: 140 lb (63.5 kg)  Height: 5' 1 (1.549 m)   Body mass index is 26.45 kg/m. Physical Exam Vitals reviewed.  Constitutional:      General: She is not in acute distress. HENT:     Head: Normocephalic.  Eyes:     General:        Right eye: No discharge.        Left eye: No discharge.     Comments: Bilateral sclera mildly red, tan drainage present  Cardiovascular:     Rate and Rhythm: Normal rate and regular rhythm.     Pulses: Normal pulses.     Heart sounds: Normal heart sounds.  Pulmonary:     Effort: Pulmonary effort is normal.     Breath sounds: Normal breath sounds.  Abdominal:     General: Bowel sounds are normal. There is no distension.     Palpations: Abdomen is soft.     Tenderness: There is no abdominal tenderness.  Musculoskeletal:     Cervical back: Neck supple.     Right lower leg: No edema.     Left lower leg: No edema.  Skin:    General: Skin is warm.     Capillary Refill: Capillary refill takes less than 2 seconds.  Neurological:     General: No focal deficit present.     Mental Status: She is alert. Mental status is at baseline.     Gait: Gait abnormal.  Psychiatric:     Comments: Alert to self, does not follow some commands     Labs reviewed: Recent Labs    11/09/23 0000  NA 142  K 4.5  CL 105  CO2 26*  BUN 16  CREATININE 0.8  CALCIUM 9.8   Recent Labs    11/09/23 0000  AST 18  ALT 13  ALKPHOS 98  ALBUMIN 3.9   Recent Labs    11/09/23 0000  WBC 6.3  HGB 14.0   HCT 41  PLT 243   Lab Results  Component Value Date   TSH 3.40 05/02/2022   No results found for: HGBA1C Lab Results  Component Value Date   CHOL 139 06/03/2022   HDL 49 06/03/2022   LDLCALC 76 06/03/2022   TRIG 69 06/03/2022    Significant Diagnostic Results in last 30 days:  No results found.  Assessment/Plan 1. Acute bacterial conjunctivitis of both eyes (Primary) - increased redness, pain and drainage  - unsuccessful trial Systane eye drops - wash eyes with gently baby soap BID x 5 days -  start Ofloxacin - ofloxacin (OCUFLOX) 0.3 % ophthalmic solution; Place 1 drop into both eyes 4 (four) times daily for 5 days.  Dispense: 5 mL; Refill: 0  2. Dementia of the Alzheimer's type, with late onset, with delusions (HCC) - no behaviors - dependent with ADLs - weight stable - nonambulatory - cont Namenda    Family/ staff Communication: plan discussed with patient and nurse  Labs/tests ordered:  none

## 2024-11-21 ENCOUNTER — Encounter: Payer: Self-pay | Admitting: Internal Medicine

## 2024-11-21 ENCOUNTER — Non-Acute Institutional Stay (SKILLED_NURSING_FACILITY): Admitting: Internal Medicine

## 2024-11-21 DIAGNOSIS — K5901 Slow transit constipation: Secondary | ICD-10-CM | POA: Diagnosis not present

## 2024-11-21 DIAGNOSIS — F0280 Dementia in other diseases classified elsewhere without behavioral disturbance: Secondary | ICD-10-CM | POA: Diagnosis not present

## 2024-11-21 DIAGNOSIS — G309 Alzheimer's disease, unspecified: Secondary | ICD-10-CM

## 2024-11-21 DIAGNOSIS — H1033 Unspecified acute conjunctivitis, bilateral: Secondary | ICD-10-CM | POA: Diagnosis not present

## 2024-11-21 NOTE — Progress Notes (Signed)
 Location:  [facility] Service:  SNF ([id number])  Provider:   Code Status: DNR/Comfort care Goals of Care:     07/22/2024    9:57 AM  Advanced Directives  Does Patient Have a Medical Advance Directive? Yes  Type of Estate Agent of [place];Living will;Out of facility DNR (pink MOST or yellow form)  Does patient want to make changes to medical advance directive? No - Patient declined  Copy of Healthcare Power of Attorney in Chart? Yes - validated most recent copy scanned in chart (See row information)  Pre-existing out of facility DNR order (yellow form or pink MOST form) Yellow form placed in chart (order not valid for inpatient use)     Chief Complaint  Patient presents with   Care Management    HPI: Patient is a 89 y.o. female seen today for medical management of chronic diseases.    Lives in SNF in [place]   Patient has h/o Alzheimer's Disease and HLD She is Aphasic and Non Ambulatory Hoyer lift dependent Needs Help with Feeding         She is stable. No new Nursing issues. No Behavior issues Her weight is stable Stays in Wheelchair Needs [facility] Total Care Help with feeding On Regular Bite size diet No Falls Wt Readings from Last 3 Encounters:  11/21/24 140 lb (63.5 kg)  11/01/24 140 lb (63.5 kg)  10/27/24 145 lb 6.4 oz (66 kg)   Past Medical History:  Diagnosis Date   Alzheimer's disease (HCC)    High cholesterol    Uterine cancer (HCC)     Past Surgical History:  Procedure Laterality Date   ABDOMINAL HYSTERECTOMY  1997   [facility]   cataract surgery Bilateral     Allergies  Allergen Reactions   Penicillins Hives   Sulfa Antibiotics Hives    Outpatient Encounter Medications as of 11/21/2024  Medication Sig   Cholecalciferol (VITAMIN D3) 2000 units TABS Take 1 tablet by mouth daily.   Melatonin 1 MG TABS Take by mouth at bedtime.   memantine  (NAMENDA  XR) 28 MG CP24 24 hr capsule Take 1  capsule (28 mg total) by mouth daily.   Polyethyl Glycol-Propyl Glycol (SYSTANE) 0.4-0.3 % SOLN Apply 2 drops to eye in the morning and at bedtime.   polyethylene glycol powder (GLYCOLAX /MIRALAX ) 17 GM/SCOOP powder Take 17 g by mouth daily as needed.   No facility-administered encounter medications on file as of 11/21/2024.    Review of Systems:  Review of Systems  Unable to perform ROS: Dementia    Health Maintenance  Topic Date Due   Medicare Annual Wellness (AWV)  05/05/2025 (Originally 03/08/2022)   COVID-19 Vaccine (10 - 2025-26 season) 03/27/2025   DTaP/Tdap/Td (2 - Td or Tdap) 10/16/2028   Pneumococcal Vaccine: 50+ Years  Completed   Influenza Vaccine  Completed   Bone Density Scan  Completed   Zoster Vaccines- Shingrix  Completed   Meningococcal B Vaccine  Aged Out   Mammogram  Discontinued   Colonoscopy  Discontinued    Physical Exam: There were no vitals filed for this visit. There is no height or weight on file to calculate BMI. Physical Exam Vitals reviewed.  Constitutional:      Appearance: Normal appearance.  HENT:     Head: Normocephalic.     Nose: Nose normal.     Mouth/Throat:     Mouth: Mucous membranes are moist.     Pharynx: Oropharynx is clear.  Eyes:     Pupils: Pupils are equal, round, and reactive to light.     Comments: Some redness in Right Eye  Cardiovascular:     Rate and Rhythm: Normal rate and regular rhythm.     Pulses: Normal pulses.     Heart sounds: Normal heart sounds. No murmur heard. Pulmonary:     Effort: Pulmonary effort is normal.     Breath sounds: Normal breath sounds.  Abdominal:     General: Abdomen is flat. Bowel sounds are normal.     Palpations: Abdomen is soft.  Musculoskeletal:        General: No swelling.     Cervical back: Neck supple.  Skin:    General: Skin is warm.  Neurological:     Mental Status: She is alert.     Comments: Does not follow any Commands  Psychiatric:        Mood and Affect: Mood normal.         Thought Content: Thought content normal.     Labs reviewed: Basic Metabolic Panel: No results for input(s): NA, K, CL, CO2, GLUCOSE, BUN, CREATININE, CALCIUM, MG, PHOS, TSH in the last 8760 hours. Liver Function Tests: No results for input(s): AST, ALT, ALKPHOS, BILITOT, PROT, ALBUMIN in the last 8760 hours. No results for input(s): LIPASE, AMYLASE in the last 8760 hours. No results for input(s): AMMONIA in the last 8760 hours. CBC: No results for input(s): WBC, NEUTROABS, HGB, HCT, MCV, PLT in the last 8760 hours. Lipid Panel: No results for input(s): CHOL, HDL, LDLCALC, TRIG, CHOLHDL, LDLDIRECT in the last 8760 hours. No results found for: HGBA1C  Procedures since last visit: No results found.  Assessment/Plan 1. Conjuctivitis of Right Eye (Primary) Will Do Ocuflox  for Right eye for 2 weeks  2. Alzheimer's disease (HCC) On Namenda  Total Care  3. Slow transit constipation Miralax   4 Localized osteoporosis without current pathological fracture She is Non Ambulatory On Vit D    Labs/tests ordered:  CBC,CMP,Vitamin D level  Next appt:  Visit date not found

## 2024-11-22 LAB — VITAMIN D 25 HYDROXY (VIT D DEFICIENCY, FRACTURES): Vit D, 25-Hydroxy: 56.9

## 2024-11-22 LAB — BASIC METABOLIC PANEL WITH GFR
BUN: 19 (ref 4–21)
CO2: 22 (ref 13–22)
Chloride: 110 — AB (ref 99–108)
Creatinine: 0.9 (ref 0.5–1.1)
Glucose: 101
Potassium: 4.5 meq/L (ref 3.5–5.1)
Sodium: 147 (ref 137–147)

## 2024-11-22 LAB — CBC AND DIFFERENTIAL
HCT: 40 (ref 36–46)
Hemoglobin: 13.6 (ref 12.0–16.0)
Platelets: 243 K/uL (ref 150–400)
WBC: 6

## 2024-11-22 LAB — COMPREHENSIVE METABOLIC PANEL WITH GFR
Albumin: 3.8 (ref 3.5–5.0)
Calcium: 9.7 (ref 8.7–10.7)
Globulin: 2.8
eGFR: 61

## 2024-11-22 LAB — HEPATIC FUNCTION PANEL
ALT: 15 U/L (ref 7–35)
AST: 25 (ref 13–35)
Alkaline Phosphatase: 96 (ref 25–125)
Bilirubin, Total: 0.5

## 2024-11-22 LAB — CBC: RBC: 4.22 (ref 3.87–5.11)

## 2024-12-12 ENCOUNTER — Encounter: Payer: Self-pay | Admitting: Adult Health

## 2024-12-12 ENCOUNTER — Non-Acute Institutional Stay (SKILLED_NURSING_FACILITY): Payer: Self-pay | Admitting: Adult Health

## 2024-12-12 DIAGNOSIS — H1033 Unspecified acute conjunctivitis, bilateral: Secondary | ICD-10-CM

## 2024-12-12 NOTE — Progress Notes (Unsigned)
 [facility] Room Number: 141 P Place of Service:  SNF ([id number]) Provider: Tawni America, NP   Patient Care Team: Charlanne Fredia CROME, MD as PCP - General (Internal Medicine) Sheldon Lowes, MD as Consulting Physician (Dermatology)  Extended Emergency Contact Information Primary Emergency Contact: Claflin,[name] Address: [address] [place] [place]          [place], [place] [address] [place] Home Phone: [phone] Relation: Son  Code Status:  DNR, No Hospitalizations, Follow MOLST instructions Goals of care: Advanced Directive information    07/22/2024    9:57 AM  Advanced Directives  Does Patient Have a Medical Advance Directive? Yes  Type of Estate Agent of [place];Living will;Out of facility DNR (pink MOST or yellow form)  Does patient want to make changes to medical advance directive? No - Patient declined  Copy of Healthcare Power of Attorney in Chart? Yes - validated most recent copy scanned in chart (See row information)  Pre-existing out of facility DNR order (yellow form or pink MOST form) Yellow form placed in chart (order not valid for inpatient use)     Chief Complaint  Patient presents with   Acute Visit    Eye drainage skilled care    HPI:  Pt is a 89 y.o. female seen today for an acute visit for right eye redness  She has a hx of recurrent conjunctivitis of the right eye Hx of dementia so she can not contribute to the hx Some purulent matter.  Has been on ocuflox  drops for two weeks starting 11/21/24. Concerns that she squeezes her eyes closed and the med is not going in No known injury to the eye No appearance of pain.     Past Medical History:  Diagnosis Date   Alzheimer's disease (HCC)    High cholesterol    Uterine cancer [facility])    Past Surgical History:  Procedure Laterality Date   ABDOMINAL HYSTERECTOMY  1997   [facility]   cataract surgery Bilateral      Allergies[1]  Outpatient Encounter Medications as of 12/12/2024  Medication Sig   Cholecalciferol (VITAMIN D3) 2000 units TABS Take 1 tablet by mouth daily.   Melatonin 1 MG TABS Take by mouth at bedtime.   memantine  (NAMENDA  XR) 28 MG CP24 24 hr capsule Take 1 capsule (28 mg total) by mouth daily.   Polyethyl Glycol-Propyl Glycol (SYSTANE) 0.4-0.3 % SOLN Apply 2 drops to eye in the morning and at bedtime.   polyethylene glycol powder (GLYCOLAX /MIRALAX ) 17 GM/SCOOP powder Take 17 g by mouth daily as needed.   No facility-administered encounter medications on file as of 12/12/2024.    Review of Systems  Unable to perform ROS: Dementia    Immunization History  Administered Date(s) Administered   Fluad Quad(high Dose 65+) 10/03/2022, 10/20/2023   INFLUENZA, HIGH DOSE SEASONAL PF 10/07/2019   Influenza,inj,Quad PF,6+ Mos 10/22/2018   Influenza-Unspecified 10/31/2015, 10/23/2016, 10/19/2017, 10/23/2020, 10/02/2021, 10/03/2022, 09/27/2024   Moderna SARS-COV2 Booster Vaccination 10/09/2021, 04/24/2022, 11/03/2022, 04/24/2023, 09/27/2024   Moderna Sars-Covid-2 Vaccination 01/31/2020, 02/28/2020, 10/16/2020   PFIZER Comirnaty(Gray Top)Covid-19 Tri-Sucrose Vaccine 10/20/2023   Pfizer Covid-19 Vaccine Bivalent Booster 90yrs & up 04/24/2023   Pneumococcal Conjugate-13 06/16/2018   Pneumococcal Polysaccharide-23 02/04/2012   Tdap 10/16/2018   Unspecified SARS-COV-2 Vaccination 09/27/2024   Zoster Recombinant(Shingrix) 07/10/2017, 11/13/2017, 06/27/2018   Pertinent  Health Maintenance Due  Topic Date Due   Influenza Vaccine  Completed   Bone Density Scan  Completed  Mammogram  Discontinued   Colonoscopy  Discontinued      06/02/2023   12:34 PM 09/18/2023    8:35 AM 01/11/2024    2:57 PM 05/05/2024    2:30 PM 11/01/2024    4:02 PM  Fall Risk  Falls in the past year?  0 0 0 0  Was there an injury with Fall? 0  0  0  0  0   Fall Risk Category Calculator  0 0 0 0  Patient at Risk for  Falls Due to History of fall(s);Impaired balance/gait;Impaired mobility;Mental status change  No Fall Risks  History of fall(s);Impaired balance/gait  Fall risk Follow up Falls evaluation completed;Education provided;Falls prevention discussed Falls evaluation completed Falls evaluation completed Falls evaluation completed Falls evaluation completed     Data saved with a previous flowsheet row definition   Functional Status Survey:    Vitals:   12/12/24 0915  BP: (!) 167/71  Pulse: 75  Resp: 19  Temp: 97.9 F (36.6 C)  SpO2: 95%  Weight: 140 lb (63.5 kg)  Height: 5' 1 (1.549 m)   Body mass index is 26.45 kg/m. Physical Exam Vitals reviewed.  Constitutional:      Appearance: Normal appearance.  HENT:     Nose: Nose normal.     Mouth/Throat:     Mouth: Mucous membranes are moist.     Pharynx: Oropharynx is clear.  Eyes:     General:        Right eye: Discharge present.        Left eye: No discharge.     Conjunctiva/sclera:     Right eye: Right conjunctiva is injected. Exudate present. No chemosis or hemorrhage.    Left eye: Left conjunctiva is not injected. No chemosis.    Pupils: Pupils are equal, round, and reactive to light.  Lymphadenopathy:     Cervical: No cervical adenopathy.  Neurological:     Mental Status: She is alert.     Labs reviewed: Recent Labs    11/22/24 0000  NA 147  K 4.5  CL 110*  CO2 22  BUN 19  CREATININE 0.9  CALCIUM 9.7   Recent Labs    11/22/24 0000  AST 25  ALT 15  ALKPHOS 96  ALBUMIN 3.8   Recent Labs    11/22/24 0000  WBC 6.0  HGB 13.6  HCT 40  PLT 243   Lab Results  Component Value Date   TSH 3.40 05/02/2022   No results found for: HGBA1C Lab Results  Component Value Date   CHOL 139 06/03/2022   HDL 49 06/03/2022   LDLCALC 76 06/03/2022   TRIG 69 06/03/2022    Significant Diagnostic Results in last 30 days:  No results found.  Assessment/Plan   1. Acute bacterial conjunctivitis of both eyes  (Primary)  Started gentamicin  ointment qid x 2 weeks  Recommend ophthalmology referral due to recurrent issue and in need of full dilated eye exam with pressure check       [1]  Allergies Allergen Reactions   Penicillins Hives   Sulfa Antibiotics Hives

## 2024-12-15 ENCOUNTER — Encounter: Payer: Self-pay | Admitting: Adult Health

## 2024-12-15 ENCOUNTER — Non-Acute Institutional Stay (SKILLED_NURSING_FACILITY): Payer: Self-pay | Admitting: Adult Health

## 2024-12-15 DIAGNOSIS — H10501 Unspecified blepharoconjunctivitis, right eye: Secondary | ICD-10-CM | POA: Diagnosis not present

## 2024-12-15 DIAGNOSIS — G301 Alzheimer's disease with late onset: Secondary | ICD-10-CM

## 2024-12-15 DIAGNOSIS — H10509 Unspecified blepharoconjunctivitis, unspecified eye: Secondary | ICD-10-CM | POA: Insufficient documentation

## 2024-12-15 DIAGNOSIS — K5901 Slow transit constipation: Secondary | ICD-10-CM

## 2024-12-15 DIAGNOSIS — R1312 Dysphagia, oropharyngeal phase: Secondary | ICD-10-CM

## 2024-12-15 DIAGNOSIS — F02818 Dementia in other diseases classified elsewhere, unspecified severity, with other behavioral disturbance: Secondary | ICD-10-CM

## 2024-12-15 DIAGNOSIS — E559 Vitamin D deficiency, unspecified: Secondary | ICD-10-CM | POA: Diagnosis not present

## 2024-12-15 MED ORDER — NEOMYCIN-POLYMYXIN-DEXAMETH 3.5-10000-0.1 OP SUSP: 1.0000 [drp] | Freq: Four times a day (QID) | OPHTHALMIC | Status: AC

## 2024-12-15 NOTE — Assessment & Plan Note (Signed)
 Vitamin d supplementation Level adequate

## 2024-12-15 NOTE — Progress Notes (Signed)
 Location:  [facility] Service:  SNF (31) Provider:  Tawni America, NP   Patient Care Team: Charlanne Fredia CROME, MD as PCP - General (Internal Medicine) Sheldon Lowes, MD as Consulting Physician (Dermatology)  Extended Emergency Contact Information Primary Emergency Contact: Jasmer,[name] Address: [address]          [place], [place] [address] [place] Home Phone: [phone] Relation: Son  Code Status:  DNR, No Hospitalizations, Follow MOST instructions Goals of care: Advanced Directive information    07/22/2024    9:57 AM  Advanced Directives  Does Patient Have a Medical Advance Directive? Yes  Type of Estate Agent of [place];Living will;Out of facility DNR (pink MOST or yellow form)  Does patient want to make changes to medical advance directive? No - Patient declined  Copy of Healthcare Power of Attorney in Chart? Yes - validated most recent copy scanned in chart (See row information)  Pre-existing out of facility DNR order (yellow form or pink MOST form) Yellow form placed in chart (order not valid for inpatient use)     Chief Complaint  Patient presents with   Medical Management of Chronic Issues    HPI:  Pt is a 89 y.o. female seen today for medical management of chronic diseases.    Resides in skilled care due to severe Alz dementia. Minimal verbalization.   Uses hoyer lift for transfers. Needs assistance with all ADls and is incontinent.  She was getting recurrent conjunctivitis and was prescribed maxitrol this week by ophthalmology for blepharoconjunctivitis. Her eyes are much less red, no drainage. No fever  Dysphagia: has seen ST, recommended for a reg diet thin liquid, no straws, bite size food  Goals of care are comfort based with no hospitalizations.  HLD off zocor  due to goals of care.   Constipation: laxatives are prn due to loose stools which has improved.   Weights  reviewed Down 7.4lbs over the past year.   OP: BMD 08/22/21 T score -2.8  On vit d  On melatonin for sleep, no issues.   Past Medical History:  Diagnosis Date   Alzheimer's disease (HCC)    High cholesterol    Uterine cancer (HCC)    Past Surgical History:  Procedure Laterality Date   ABDOMINAL HYSTERECTOMY  1997   [facility]   cataract surgery Bilateral     Allergies  Allergen Reactions   Penicillins Hives   Sulfa Antibiotics Hives    Outpatient Encounter Medications as of 12/15/2024  Medication Sig   neomycin -polymyxin b-dexamethasone (MAXITROL) 3.5-10000-0.1 SUSP Place 1 drop into both eyes every 6 (six) hours for 6 days.   Cholecalciferol (VITAMIN D3) 2000 units TABS Take 1 tablet by mouth daily.   Melatonin 1 MG TABS Take by mouth at bedtime.   memantine  (NAMENDA  XR) 28 MG CP24 24 hr capsule Take 1 capsule (28 mg total) by mouth daily.   Polyethyl Glycol-Propyl Glycol (SYSTANE) 0.4-0.3 % SOLN Apply 2 drops to eye in the morning and at bedtime.   polyethylene glycol powder (GLYCOLAX /MIRALAX ) 17 GM/SCOOP powder Take 17 g by mouth daily as needed.   No facility-administered encounter medications on file as of 12/15/2024.    Review of Systems  Unable to perform ROS: Dementia    Immunization History  Administered Date(s) Administered   Fluad Quad(high Dose 65+) 10/03/2022, 10/20/2023   INFLUENZA, HIGH DOSE SEASONAL PF 10/07/2019   Influenza,inj,Quad PF,6+ Mos 10/22/2018   Influenza-Unspecified 10/31/2015, 10/23/2016,  10/19/2017, 10/23/2020, 10/02/2021, 10/03/2022, 09/27/2024   Moderna SARS-COV2 Booster Vaccination 10/09/2021, 04/24/2022, 11/03/2022, 04/24/2023, 09/27/2024   Moderna Sars-Covid-2 Vaccination 01/31/2020, 02/28/2020, 10/16/2020   PFIZER Comirnaty(Gray Top)Covid-19 Tri-Sucrose Vaccine 10/20/2023   Pfizer Covid-19 Vaccine Bivalent Booster 58yrs & up 04/24/2023   Pneumococcal Conjugate-13 06/16/2018   Pneumococcal Polysaccharide-23 02/04/2012   Tdap  10/16/2018   Unspecified SARS-COV-2 Vaccination 09/27/2024   Zoster Recombinant(Shingrix) 07/10/2017, 11/13/2017, 06/27/2018   Pertinent  Health Maintenance Due  Topic Date Due   Influenza Vaccine  Completed   Bone Density Scan  Completed   Mammogram  Discontinued   Colonoscopy  Discontinued      06/02/2023   12:34 PM 09/18/2023    8:35 AM 01/11/2024    2:57 PM 05/05/2024    2:30 PM 11/01/2024    4:02 PM  Fall Risk  Falls in the past year?  0 0 0 0  Was there an injury with Fall? 0  0  0  0  0   Fall Risk Category Calculator  0 0 0 0  Patient at Risk for Falls Due to History of fall(s);Impaired balance/gait;Impaired mobility;Mental status change  No Fall Risks  History of fall(s);Impaired balance/gait  Fall risk Follow up Falls evaluation completed;Education provided;Falls prevention discussed Falls evaluation completed Falls evaluation completed Falls evaluation completed Falls evaluation completed     Data saved with a previous flowsheet row definition   Functional Status Survey:    Vitals:   12/15/24 1439  BP: 137/67  Pulse: 76  Resp: 18  Temp: 97.9 F (36.6 C)  SpO2: 95%  Weight: 140 lb (63.5 kg)   Body mass index is 26.45 kg/m. Physical Exam Vitals and nursing note reviewed.  Constitutional:      General: She is not in acute distress.    Appearance: Normal appearance. She is not diaphoretic.  HENT:     Head: Normocephalic and atraumatic.     Nose: No congestion.     Mouth/Throat:     Mouth: Mucous membranes are moist.     Pharynx: Oropharynx is clear. No oropharyngeal exudate or posterior oropharyngeal erythema.  Eyes:     Conjunctiva/sclera: Conjunctivae normal.     Pupils: Pupils are equal, round, and reactive to light.  Neck:     Thyroid : No thyromegaly.     Vascular: No carotid bruit or JVD.  Cardiovascular:     Rate and Rhythm: Normal rate and regular rhythm.     Heart sounds: Normal heart sounds. No murmur heard. Pulmonary:     Effort: Pulmonary  effort is normal. No respiratory distress.     Breath sounds: Normal breath sounds. No stridor.  Abdominal:     General: Bowel sounds are normal. There is no distension.     Palpations: Abdomen is soft.     Tenderness: There is no abdominal tenderness. There is no right CVA tenderness or left CVA tenderness.  Musculoskeletal:     Cervical back: No rigidity. No muscular tenderness.     Right lower leg: No edema.     Left lower leg: No edema.  Lymphadenopathy:     Cervical: No cervical adenopathy.  Skin:    General: Skin is warm and dry.  Neurological:     General: No focal deficit present.     Mental Status: She is alert. Mental status is at baseline.     Cranial Nerves: No cranial nerve deficit.  Psychiatric:        Mood and Affect: Mood normal.  Labs reviewed: Recent Labs    11/22/24 0000  NA 147  K 4.5  CL 110*  CO2 22  BUN 19  CREATININE 0.9  CALCIUM 9.7   Recent Labs    11/22/24 0000  AST 25  ALT 15  ALKPHOS 96  ALBUMIN 3.8   Recent Labs    11/22/24 0000  WBC 6.0  HGB 13.6  HCT 40  PLT 243   Lab Results  Component Value Date   TSH 3.40 05/02/2022   No results found for: HGBA1C Lab Results  Component Value Date   CHOL 139 06/03/2022   HDL 49 06/03/2022   LDLCALC 76 06/03/2022   TRIG 69 06/03/2022    Significant Diagnostic Results in last 30 days:  No results found.  Assessment/Plan  Oropharyngeal dysphagia Continue diet modification Asp prec  Slow transit constipation Miralax  prn  Dementia of the Alzheimer's type, with late onset, with delusions (HCC) Progressive decline in cognition and physical function c/w the disease. Continue supportive care in the skilled environment. Remains on namenda .   Blepharoconjunctivitis Improved On maxitrol  Vitamin D deficiency Vitamin d supplementation Level adequate     Labs/tests ordered:  NA

## 2024-12-15 NOTE — Assessment & Plan Note (Signed)
 Continue diet modification Asp prec

## 2024-12-15 NOTE — Assessment & Plan Note (Signed)
 Improved On maxitrol

## 2024-12-15 NOTE — Assessment & Plan Note (Signed)
 Miralax prn

## 2024-12-15 NOTE — Assessment & Plan Note (Signed)
 Progressive decline in cognition and physical function c/w the disease. Continue supportive care in the skilled environment. Remains on namenda .

## 2025-01-10 ENCOUNTER — Non-Acute Institutional Stay (SKILLED_NURSING_FACILITY): Admitting: Orthopedic Surgery

## 2025-01-10 ENCOUNTER — Encounter: Payer: Self-pay | Admitting: Orthopedic Surgery

## 2025-01-10 DIAGNOSIS — H1033 Unspecified acute conjunctivitis, bilateral: Secondary | ICD-10-CM

## 2025-01-10 DIAGNOSIS — R5381 Other malaise: Secondary | ICD-10-CM | POA: Diagnosis not present

## 2025-01-10 DIAGNOSIS — G309 Alzheimer's disease, unspecified: Secondary | ICD-10-CM | POA: Diagnosis not present

## 2025-01-10 DIAGNOSIS — F0280 Dementia in other diseases classified elsewhere without behavioral disturbance: Secondary | ICD-10-CM | POA: Diagnosis not present

## 2025-01-10 MED ORDER — OFLOXACIN 0.3 % OP SOLN: 1.0000 [drp] | Freq: Three times a day (TID) | OPHTHALMIC | Status: AC

## 2025-01-10 NOTE — Progress Notes (Signed)
 " [facility] Room Number: 141/P Place of Service:  SNF [phone]) Provider:  Greig FORBES Cluster, NP   Charlanne Fredia CROME, MD  Patient Care Team: Charlanne Fredia CROME, MD as PCP - General (Internal Medicine) Sheldon Lowes, MD as Consulting Physician (Dermatology)  Extended Emergency Contact Information Primary Emergency Contact: Yaeger,[name] Address: [address]          [place], [place] [address] [place] Home Phone: [phone] Relation: Son  Code Status:  DNR Goals of care: Advanced Directive information    07/22/2024    9:57 AM  Advanced Directives  Does Patient Have a Medical Advance Directive? Yes  Type of Estate Agent of [place];Living will;Out of facility DNR (pink MOST or yellow form)  Does patient want to make changes to medical advance directive? No - Patient declined  Copy of Healthcare Power of Attorney in Chart? Yes - validated most recent copy scanned in chart (See row information)  Pre-existing out of facility DNR order (yellow form or pink MOST form) Yellow form placed in chart (order not valid for inpatient use)     Chief Complaint  Patient presents with   Acute Visit    Redness to both eyes    HPI:  Pt is a 89 y.o. female seen today for acute visit due to bilateral eye redness.   She currently resides on the skilled nursing unit at [facility]. PMH: HTN, dysphagia, AD, osteoporosis, OA, OAB, HLD, uterine cancer and insomnia.   Poor historian due to dementia. Ongoing intermittent eye redness x 6 months. She has completed 1 round Ocuflox  and Maxitrol since 10/2024. Increased eye redness began yesterday.   No behaviors. Nonambulatory. Dependent with ADLs. Remains on Namenda .   No recent falls or injuries.    Past Medical History:  Diagnosis Date   Alzheimer's disease (HCC)    High cholesterol    Uterine cancer [facility])    Past Surgical History:  Procedure Laterality Date    ABDOMINAL HYSTERECTOMY  1997   [facility]   cataract surgery Bilateral     Allergies[1]  Outpatient Encounter Medications as of 01/10/2025  Medication Sig   Cholecalciferol (VITAMIN D3) 2000 units TABS Take 1 tablet by mouth daily.   Melatonin 1 MG TABS Take by mouth at bedtime.   memantine  (NAMENDA  XR) 28 MG CP24 24 hr capsule Take 1 capsule (28 mg total) by mouth daily.   Polyethyl Glycol-Propyl Glycol (SYSTANE) 0.4-0.3 % SOLN Apply 2 drops to eye in the morning and at bedtime.   polyethylene glycol powder (GLYCOLAX /MIRALAX ) 17 GM/SCOOP powder Take 17 g by mouth daily as needed.   No facility-administered encounter medications on file as of 01/10/2025.    Review of Systems  Unable to perform ROS: Dementia    Immunization History  Administered Date(s) Administered   Fluad Quad(high Dose 65+) 10/03/2022, 10/20/2023   INFLUENZA, HIGH DOSE SEASONAL PF 10/07/2019   Influenza,inj,Quad PF,6+ Mos 10/22/2018   Influenza-Unspecified 10/31/2015, 10/23/2016, 10/19/2017, 10/23/2020, 10/02/2021, 10/03/2022, 09/27/2024   Moderna SARS-COV2 Booster Vaccination 10/09/2021, 04/24/2022, 11/03/2022, 04/24/2023, 09/27/2024   Moderna Sars-Covid-2 Vaccination 01/31/2020, 02/28/2020, 10/16/2020   PFIZER Comirnaty(Gray Top)Covid-19 Tri-Sucrose Vaccine 10/20/2023   Pfizer Covid-19 Vaccine Bivalent Booster 73yrs & up 04/24/2023   Pneumococcal Conjugate-13 06/16/2018   Pneumococcal Polysaccharide-23 02/04/2012   Tdap 10/16/2018   Unspecified SARS-COV-2 Vaccination 09/27/2024   Zoster Recombinant(Shingrix) 07/10/2017, 11/13/2017, 06/27/2018   Pertinent  Health Maintenance Due  Topic Date Due   Influenza  Vaccine  Completed   Bone Density Scan  Completed   Mammogram  Discontinued   Colonoscopy  Discontinued      06/02/2023   12:34 PM 09/18/2023    8:35 AM 01/11/2024    2:57 PM 05/05/2024    2:30 PM 11/01/2024    4:02 PM  Fall Risk  Falls in the past year?  0 0 0 0  Was there an injury with Fall? 0   0  0  0  0   Fall Risk Category Calculator  0 0 0 0  Patient at Risk for Falls Due to History of fall(s);Impaired balance/gait;Impaired mobility;Mental status change  No Fall Risks  History of fall(s);Impaired balance/gait  Fall risk Follow up Falls evaluation completed;Education provided;Falls prevention discussed Falls evaluation completed Falls evaluation completed Falls evaluation completed Falls evaluation completed     Data saved with a previous flowsheet row definition   Functional Status Survey:    Vitals:   01/10/25 1525  BP: 115/79  Pulse: 71  Resp: 19  Temp: 97.7 F (36.5 C)  SpO2: 95%  Weight: 140 lb (63.5 kg)  Height: 5' 1 (1.549 m)   Body mass index is 26.45 kg/m. Physical Exam Vitals reviewed.  Constitutional:      General: She is not in acute distress. HENT:     Head: Normocephalic.  Eyes:     General:        Right eye: Discharge present.        Left eye: Discharge present.    Conjunctiva/sclera:     Right eye: Exudate present.     Left eye: Exudate present.     Comments: Bilateral sclera red, purulent eye drainage to lashes  Cardiovascular:     Rate and Rhythm: Normal rate and regular rhythm.     Pulses: Normal pulses.     Heart sounds: Normal heart sounds.  Pulmonary:     Effort: Pulmonary effort is normal.     Breath sounds: Normal breath sounds.  Abdominal:     Palpations: Abdomen is soft.  Musculoskeletal:     Right lower leg: No edema.     Left lower leg: No edema.  Skin:    General: Skin is warm.     Capillary Refill: Capillary refill takes less than 2 seconds.  Neurological:     General: No focal deficit present.     Mental Status: She is alert and easily aroused.     Gait: Gait abnormal.  Psychiatric:        Attention and Perception: She is inattentive.     Labs reviewed: Recent Labs    11/22/24 0000  NA 147  K 4.5  CL 110*  CO2 22  BUN 19  CREATININE 0.9  CALCIUM 9.7   Recent Labs    11/22/24 0000  AST 25  ALT 15   ALKPHOS 96  ALBUMIN 3.8   Recent Labs    11/22/24 0000  WBC 6.0  HGB 13.6  HCT 40  PLT 243   Lab Results  Component Value Date   TSH 3.40 05/02/2022   No results found for: HGBA1C Lab Results  Component Value Date   CHOL 139 06/03/2022   HDL 49 06/03/2022   LDLCALC 76 06/03/2022   TRIG 69 06/03/2022    Significant Diagnostic Results in last 30 days:  No results found.  Assessment/Plan 1. Acute bacterial conjunctivitis of both eyes (Primary) - ongoing - h/o blepharoconjunctivitis - will start ofloxacin  x 5 days  -  recommend ocusoft pads at bedtime for prevention - apply warm washcloth to eyes qam for prevention  - ofloxacin  (OCUFLOX ) 0.3 % ophthalmic solution; Place 1 drop into both eyes 3 (three) times daily for 5 days.  2. Alzheimer's disease (HCC) - no behaviors - dependent with ADLs - weight stable - cont skilled nursing - cont Namenda   3. Physical debility - ambulates with Juditta  - hoyer transfer - cont skilled nursing    Family/ staff Communication: plan discussed with patient and nurse  Labs/tests ordered:  none      [1]  Allergies Allergen Reactions   Penicillins Hives   Sulfa Antibiotics Hives   "

## 2025-01-19 ENCOUNTER — Non-Acute Institutional Stay (SKILLED_NURSING_FACILITY): Payer: Self-pay | Admitting: Adult Health

## 2025-01-19 ENCOUNTER — Encounter: Payer: Self-pay | Admitting: Adult Health

## 2025-01-19 DIAGNOSIS — F02818 Dementia in other diseases classified elsewhere, unspecified severity, with other behavioral disturbance: Secondary | ICD-10-CM

## 2025-01-19 DIAGNOSIS — G301 Alzheimer's disease with late onset: Secondary | ICD-10-CM | POA: Diagnosis not present

## 2025-01-19 DIAGNOSIS — R1312 Dysphagia, oropharyngeal phase: Secondary | ICD-10-CM | POA: Diagnosis not present

## 2025-01-19 DIAGNOSIS — E559 Vitamin D deficiency, unspecified: Secondary | ICD-10-CM

## 2025-01-19 DIAGNOSIS — F5101 Primary insomnia: Secondary | ICD-10-CM | POA: Diagnosis not present

## 2025-01-19 NOTE — Progress Notes (Unsigned)
 " [facility] Room Number: 141/P Place of Service:  SNF (31) Provider: Tawni America, NP   Patient Care Team: Charlanne Fredia CROME, MD as PCP - General (Internal Medicine) Sheldon Lowes, MD as Consulting Physician (Dermatology)  Extended Emergency Contact Information Primary Emergency Contact: Sagan,[name] Address: [address]          [place], [place] [address] [place] Home Phone: [phone] Relation: Son  Code Status:  DNR, No Hospitalizations, Follow MOLST instructions Goals of care: Advanced Directive information    07/22/2024    9:57 AM  Advanced Directives  Does Patient Have a Medical Advance Directive? Yes  Type of Estate Agent of [place];Living will;Out of facility DNR (pink MOST or yellow form)  Does patient want to make changes to medical advance directive? No - Patient declined  Copy of Healthcare Power of Attorney in Chart? Yes - validated most recent copy scanned in chart (See row information)  Pre-existing out of facility DNR order (yellow form or pink MOST form) Yellow form placed in chart (order not valid for inpatient use)     Chief Complaint  Patient presents with   Routine Visit    HPI:  Pt is a 89 y.o. female seen today for medical management of chronic diseases.    Dysphagia: staff reported a choking episode. Requested CXR and thickened liquids.  CXR showed no acute findings.  She just completed work with the ST and was ordered soft food and no straws. Has been pocketing food MOST form indicates comfort care, DNR   Resides in skilled care due to severe Alz dementia. Minimal verbalization.   Uses hoyer lift for transfers. Needs assistance with all ADls and is incontinent.  She was getting recurrent conjunctivitis and was prescribed maxitrol in Dec now resolved.   HLD off zocor  due to goals of care.   Constipation: no reports of issues. Laxatives are  prn  Weights reviewed Has lost 3 lbs from last month OP: BMD 08/22/21 T score -2.8   On vit d Level 56.06 Nov 2024  On melatonin for sleep, no issues.  Past Medical History:  Diagnosis Date   Alzheimer's disease (HCC)    High cholesterol    Uterine cancer [facility])    Past Surgical History:  Procedure Laterality Date   ABDOMINAL HYSTERECTOMY  1997   [facility]   cataract surgery Bilateral     Allergies[1]  Outpatient Encounter Medications as of 01/19/2025  Medication Sig   Cholecalciferol (VITAMIN D3) 2000 units TABS Take 1 tablet by mouth daily.   Eyelid Cleansers (OCUSOFT LID SCRUB PLUS) PADS Apply topically. Apply to both eyes topically in the evening for eye redness Ocusoft lid scrub moistened pads QHS   Melatonin 1 MG TABS Take by mouth at bedtime.   memantine  (NAMENDA  XR) 28 MG CP24 24 hr capsule Take 1 capsule (28 mg total) by mouth daily.   Polyethyl Glycol-Propyl Glycol (SYSTANE) 0.4-0.3 % SOLN Apply 2 drops to eye in the morning and at bedtime.   polyethylene glycol powder (GLYCOLAX /MIRALAX ) 17 GM/SCOOP powder Take 17 g by mouth daily as needed.   No facility-administered encounter medications on file as of 01/19/2025.    Review of Systems  Unable to perform ROS: Dementia    Immunization History  Administered Date(s) Administered   Fluad Quad(high Dose 65+) 10/03/2022, 10/20/2023   INFLUENZA, HIGH DOSE SEASONAL PF 10/07/2019   Influenza,inj,Quad PF,6+ Mos 10/22/2018   Influenza-Unspecified 10/31/2015, 10/23/2016,  10/19/2017, 10/23/2020, 10/02/2021, 10/03/2022, 09/27/2024   Moderna SARS-COV2 Booster Vaccination 10/09/2021, 04/24/2022, 11/03/2022, 04/24/2023, 09/27/2024   Moderna Sars-Covid-2 Vaccination 01/31/2020, 02/28/2020, 10/16/2020   PFIZER Comirnaty(Gray Top)Covid-19 Tri-Sucrose Vaccine 10/20/2023   Pfizer Covid-19 Vaccine Bivalent Booster 77yrs & up 04/24/2023   Pneumococcal Conjugate-13 06/16/2018   Pneumococcal Polysaccharide-23 02/04/2012   Tdap  10/16/2018   Unspecified SARS-COV-2 Vaccination 09/27/2024   Zoster Recombinant(Shingrix) 07/10/2017, 11/13/2017, 06/27/2018   Pertinent  Health Maintenance Due  Topic Date Due   Influenza Vaccine  Completed   Bone Density Scan  Completed   Mammogram  Discontinued   Colonoscopy  Discontinued      09/18/2023    8:35 AM 01/11/2024    2:57 PM 05/05/2024    2:30 PM 11/01/2024    4:02 PM 01/10/2025    3:39 PM  Fall Risk  Falls in the past year? 0 0 0 0 Exclusion - non ambulatory  Was there an injury with Fall? 0  0  0  0    Fall Risk Category Calculator 0 0 0 0   Patient at Risk for Falls Due to  No Fall Risks  History of fall(s);Impaired balance/gait   Fall risk Follow up Falls evaluation completed Falls evaluation completed Falls evaluation completed Falls evaluation completed      Data saved with a previous flowsheet row definition   Functional Status Survey:    Vitals:   01/19/25 1359 01/19/25 1435  BP: (!) 142/82 121/76  Pulse: 77   Resp: 16   Temp: (!) 97.5 F (36.4 C)   SpO2: 92%   Weight: 140 lb (63.5 kg)   Height: 5' 1 (1.549 m)    Body mass index is 26.45 kg/m. Physical Exam Vitals and nursing note reviewed.  Constitutional:      General: She is not in acute distress.    Appearance: Normal appearance. She is not diaphoretic.  HENT:     Head: Normocephalic and atraumatic.     Nose: No congestion.     Mouth/Throat:     Comments: Would not open mouth Eyes:     Conjunctiva/sclera: Conjunctivae normal.     Pupils: Pupils are equal, round, and reactive to light.  Neck:     Thyroid : No thyromegaly.     Vascular: No carotid bruit or JVD.  Cardiovascular:     Rate and Rhythm: Normal rate and regular rhythm.     Heart sounds: Normal heart sounds. No murmur heard. Pulmonary:     Effort: Pulmonary effort is normal. No respiratory distress.     Breath sounds: Normal breath sounds. No stridor.  Abdominal:     General: Bowel sounds are normal. There is no  distension.     Palpations: Abdomen is soft.     Tenderness: There is no abdominal tenderness. There is no right CVA tenderness or left CVA tenderness.  Musculoskeletal:        General: Normal range of motion.     Cervical back: No rigidity. No muscular tenderness.     Right lower leg: No edema.     Left lower leg: No edema.  Lymphadenopathy:     Cervical: No cervical adenopathy.  Skin:    General: Skin is warm and dry.  Neurological:     General: No focal deficit present.     Mental Status: She is alert. Mental status is at baseline.  Psychiatric:        Mood and Affect: Mood normal.     Labs reviewed: Recent Labs  11/22/24 0000  NA 147  K 4.5  CL 110*  CO2 22  BUN 19  CREATININE 0.9  CALCIUM 9.7   Recent Labs    11/22/24 0000  AST 25  ALT 15  ALKPHOS 96  ALBUMIN 3.8   Recent Labs    11/22/24 0000  WBC 6.0  HGB 13.6  HCT 40  PLT 243   Lab Results  Component Value Date   TSH 3.40 05/02/2022   No results found for: HGBA1C Lab Results  Component Value Date   CHOL 139 06/03/2022   HDL 49 06/03/2022   LDLCALC 76 06/03/2022   TRIG 69 06/03/2022    Significant Diagnostic Results in last 30 days:  No results found.  Assessment/Plan  1. Oropharyngeal dysphagia (Primary) Worsening Soft food with thickened liquids No straws CXR neg Asp prec DNR and most form in place.   2. Dementia of the Alzheimer's type, with late onset, with delusions (HCC) Progressive decline in cognition and physical function c/w the disease. Continue supportive care in the skilled environment.   3. Primary insomnia On melatonin   4. Vitamin D deficiency On supplementation     Labs/tests ordered:  CXR        [1]  Allergies Allergen Reactions   Penicillins Hives   Sulfa Antibiotics Hives   "

## 2025-01-20 ENCOUNTER — Encounter: Payer: Self-pay | Admitting: Adult Health
